# Patient Record
Sex: Male | Born: 1950 | Race: Black or African American | Hispanic: No | Marital: Single | Smoking: Former smoker
Health system: Southern US, Community
[De-identification: ages and names within clinical notes are randomized; demographics above are authoritative.]

## PROBLEM LIST (undated history)

## (undated) DIAGNOSIS — K5790 Diverticulosis of intestine, part unspecified, without perforation or abscess without bleeding: Secondary | ICD-10-CM

## (undated) DIAGNOSIS — Z973 Presence of spectacles and contact lenses: Secondary | ICD-10-CM

## (undated) DIAGNOSIS — J302 Other seasonal allergic rhinitis: Secondary | ICD-10-CM

## (undated) DIAGNOSIS — M1990 Unspecified osteoarthritis, unspecified site: Secondary | ICD-10-CM

## (undated) DIAGNOSIS — E669 Obesity, unspecified: Secondary | ICD-10-CM

## (undated) DIAGNOSIS — R0600 Dyspnea, unspecified: Secondary | ICD-10-CM

## (undated) DIAGNOSIS — K649 Unspecified hemorrhoids: Secondary | ICD-10-CM

## (undated) DIAGNOSIS — I10 Essential (primary) hypertension: Secondary | ICD-10-CM

## (undated) DIAGNOSIS — G709 Myoneural disorder, unspecified: Secondary | ICD-10-CM

## (undated) HISTORY — PX: KNEE ARTHROSCOPY: SUR90

## (undated) HISTORY — PX: TONSILLECTOMY: SUR1361

## (undated) HISTORY — DX: Diverticulosis of intestine, part unspecified, without perforation or abscess without bleeding: K57.90

## (undated) HISTORY — PX: OTHER SURGICAL HISTORY: SHX169

## (undated) HISTORY — DX: Unspecified osteoarthritis, unspecified site: M19.90

## (undated) HISTORY — DX: Unspecified hemorrhoids: K64.9

## (undated) HISTORY — DX: Obesity, unspecified: E66.9

---

## 1999-02-26 ENCOUNTER — Emergency Department (HOSPITAL_COMMUNITY): Admission: EM | Admit: 1999-02-26 | Discharge: 1999-02-26 | Payer: Self-pay | Admitting: Emergency Medicine

## 1999-02-26 ENCOUNTER — Encounter: Payer: Self-pay | Admitting: Emergency Medicine

## 1999-03-02 ENCOUNTER — Ambulatory Visit (HOSPITAL_COMMUNITY): Admission: RE | Admit: 1999-03-02 | Discharge: 1999-03-02 | Payer: Self-pay | Admitting: Orthopedic Surgery

## 1999-03-02 ENCOUNTER — Encounter: Payer: Self-pay | Admitting: Orthopedic Surgery

## 1999-03-10 ENCOUNTER — Inpatient Hospital Stay (HOSPITAL_COMMUNITY): Admission: RE | Admit: 1999-03-10 | Discharge: 1999-03-13 | Payer: Self-pay | Admitting: Orthopedic Surgery

## 1999-03-10 ENCOUNTER — Encounter: Payer: Self-pay | Admitting: Orthopedic Surgery

## 1999-11-28 ENCOUNTER — Emergency Department (HOSPITAL_COMMUNITY): Admission: EM | Admit: 1999-11-28 | Discharge: 1999-11-28 | Payer: Self-pay | Admitting: *Deleted

## 2005-05-24 ENCOUNTER — Ambulatory Visit: Payer: Self-pay | Admitting: Family Medicine

## 2005-06-25 HISTORY — PX: COLONOSCOPY: SHX174

## 2005-06-27 ENCOUNTER — Ambulatory Visit: Payer: Self-pay | Admitting: Family Medicine

## 2005-07-02 ENCOUNTER — Ambulatory Visit: Payer: Self-pay | Admitting: Internal Medicine

## 2005-07-13 ENCOUNTER — Ambulatory Visit: Payer: Self-pay | Admitting: Family Medicine

## 2005-09-03 ENCOUNTER — Ambulatory Visit: Payer: Self-pay | Admitting: Family Medicine

## 2005-10-29 ENCOUNTER — Ambulatory Visit: Payer: Self-pay | Admitting: Family Medicine

## 2006-01-15 ENCOUNTER — Ambulatory Visit: Payer: Self-pay | Admitting: Family Medicine

## 2006-01-24 ENCOUNTER — Ambulatory Visit: Payer: Self-pay | Admitting: Family Medicine

## 2006-01-29 ENCOUNTER — Encounter: Payer: Self-pay | Admitting: Cardiology

## 2006-01-29 ENCOUNTER — Ambulatory Visit: Payer: Self-pay

## 2006-02-14 ENCOUNTER — Ambulatory Visit (HOSPITAL_COMMUNITY): Admission: RE | Admit: 2006-02-14 | Discharge: 2006-02-14 | Payer: Self-pay | Admitting: Gastroenterology

## 2006-02-14 LAB — HM COLONOSCOPY: HM Colonoscopy: NORMAL

## 2006-02-26 ENCOUNTER — Ambulatory Visit: Payer: Self-pay | Admitting: Family Medicine

## 2006-03-04 ENCOUNTER — Ambulatory Visit: Payer: Self-pay | Admitting: *Deleted

## 2006-03-11 ENCOUNTER — Ambulatory Visit: Payer: Self-pay

## 2007-03-25 ENCOUNTER — Ambulatory Visit: Payer: Self-pay | Admitting: Family Medicine

## 2007-04-17 ENCOUNTER — Ambulatory Visit: Payer: Self-pay | Admitting: Family Medicine

## 2008-02-25 ENCOUNTER — Ambulatory Visit: Payer: Self-pay | Admitting: Family Medicine

## 2008-12-01 ENCOUNTER — Ambulatory Visit: Payer: Self-pay | Admitting: Family Medicine

## 2009-03-13 ENCOUNTER — Emergency Department (HOSPITAL_COMMUNITY): Admission: EM | Admit: 2009-03-13 | Discharge: 2009-03-13 | Payer: Self-pay | Admitting: Emergency Medicine

## 2009-03-13 IMAGING — CR DG SHOULDER 2+V*L*
3 series · 3 of 3 positions shown · non-contrast
Comparison: None

CLINICAL DATA: Left shoulder pain.

LEFT SHOULDER - 2+ VIEW

[w shoulder ap internal left]
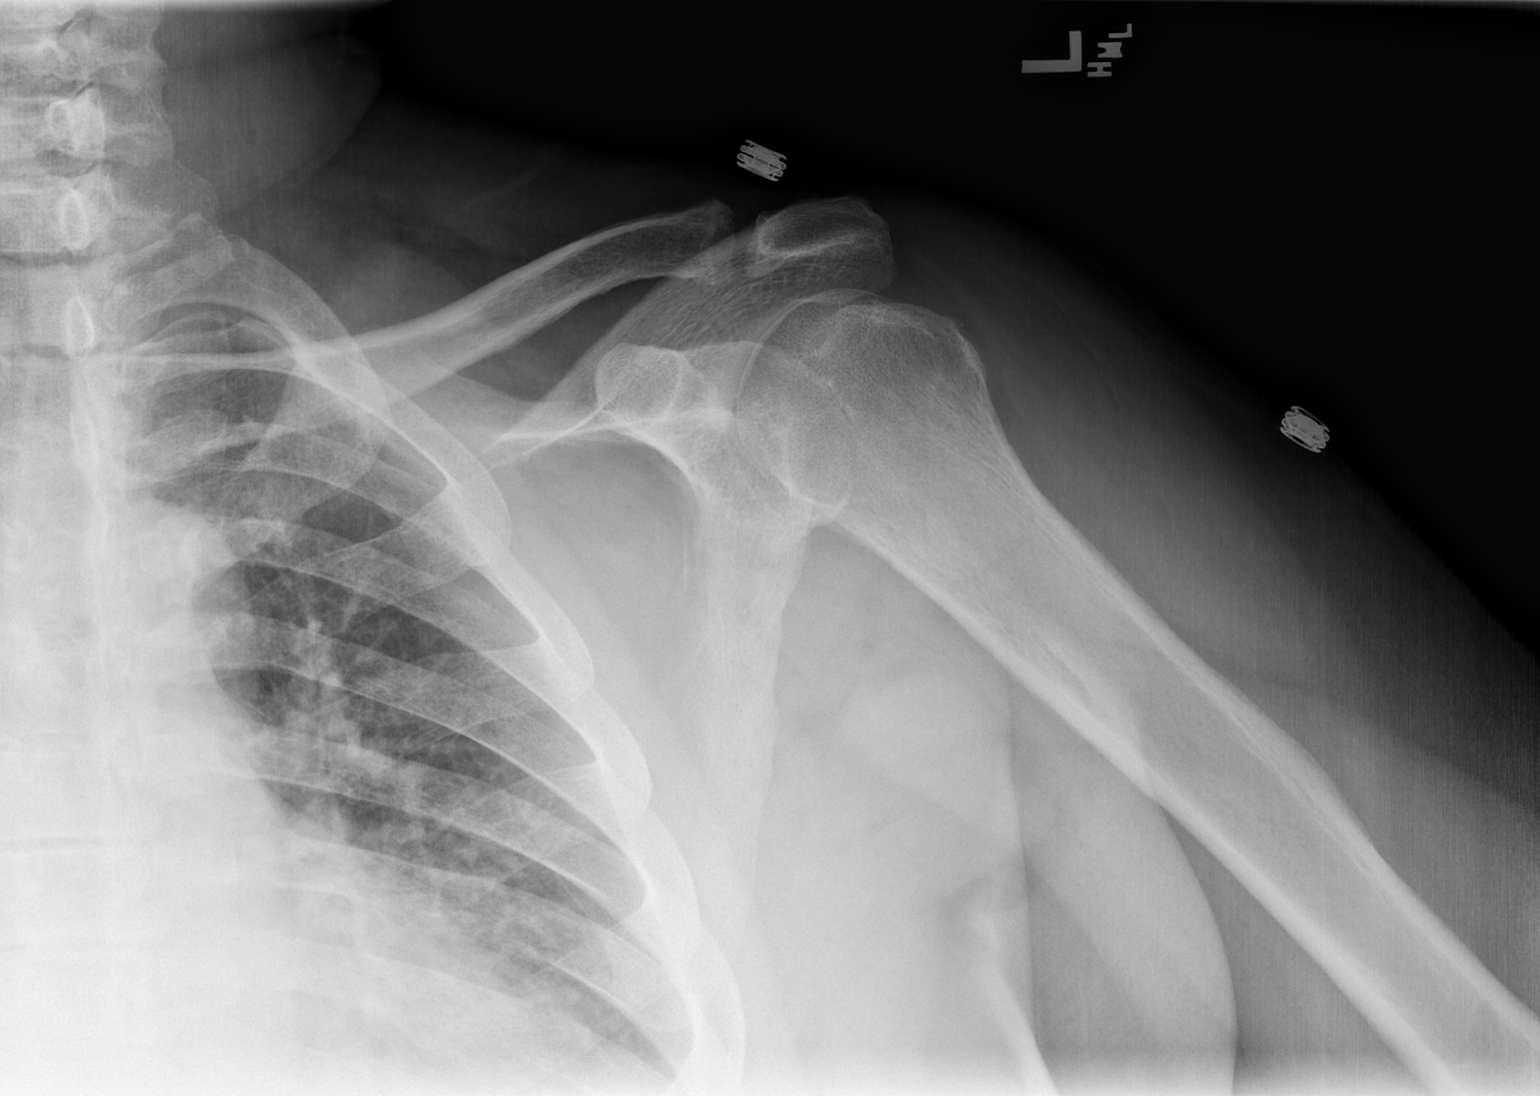

[w shoulder ap external left]
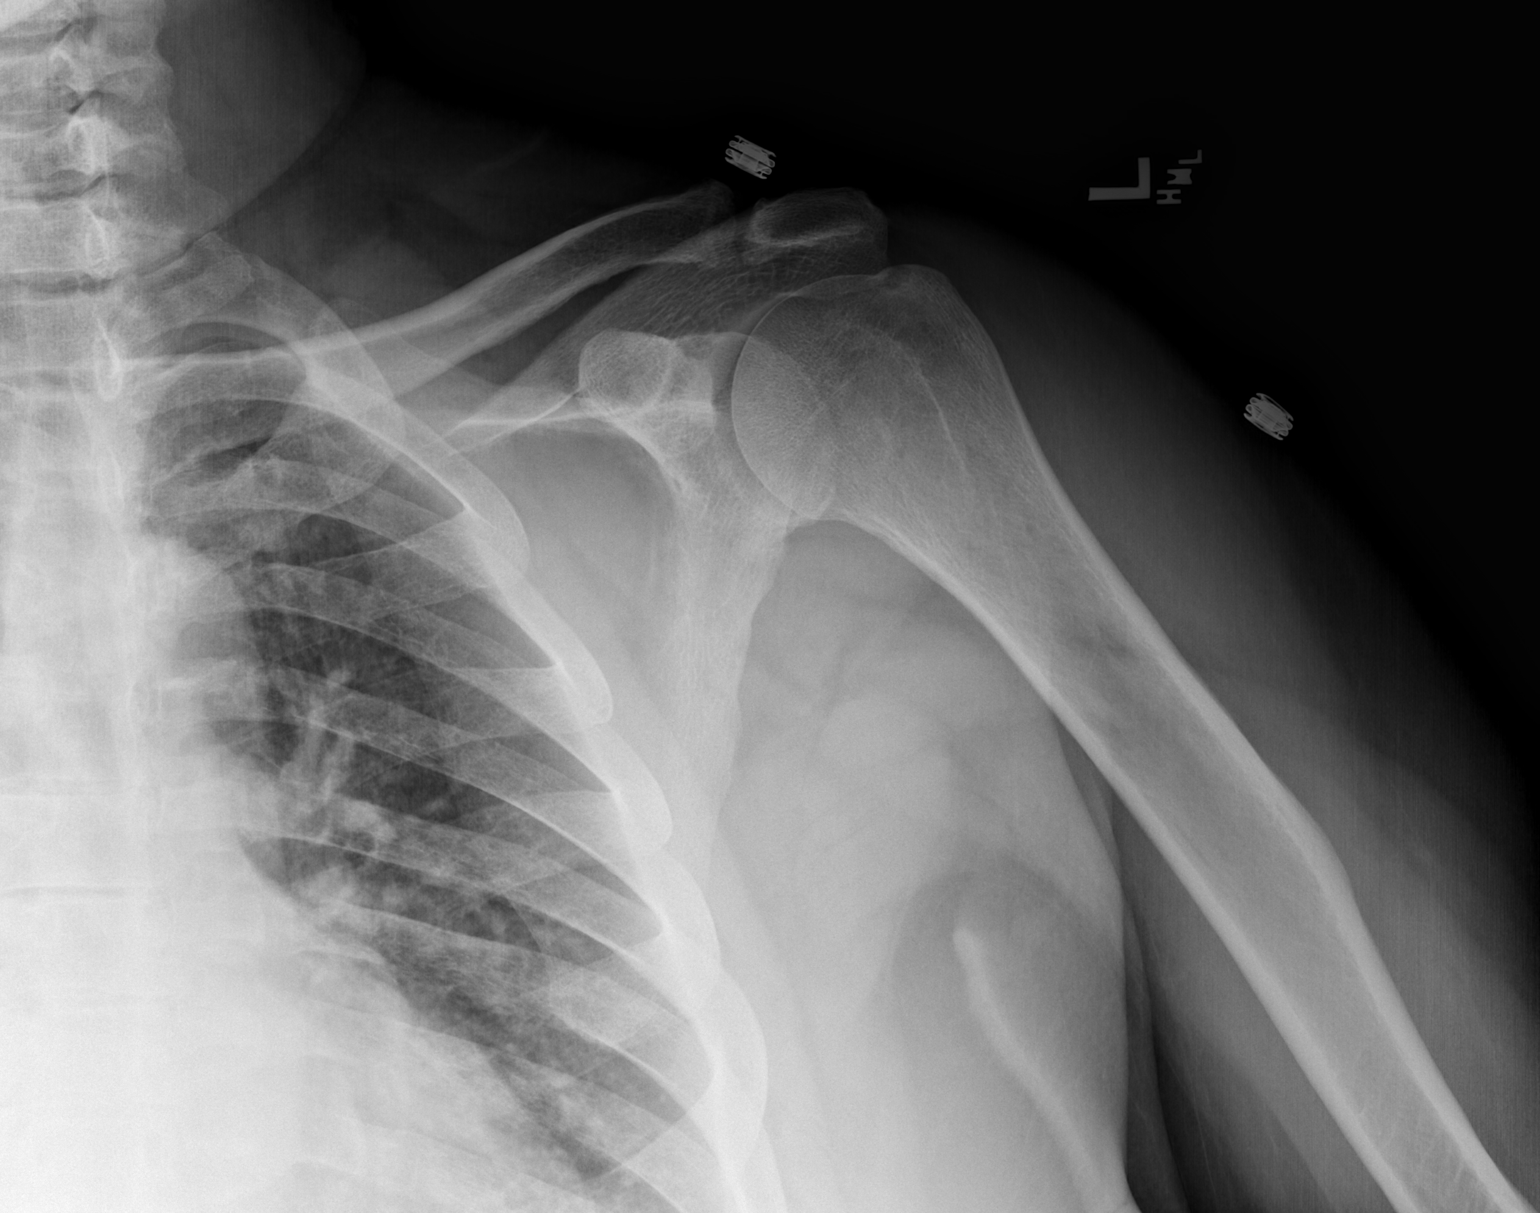

[w shoulder y view left]
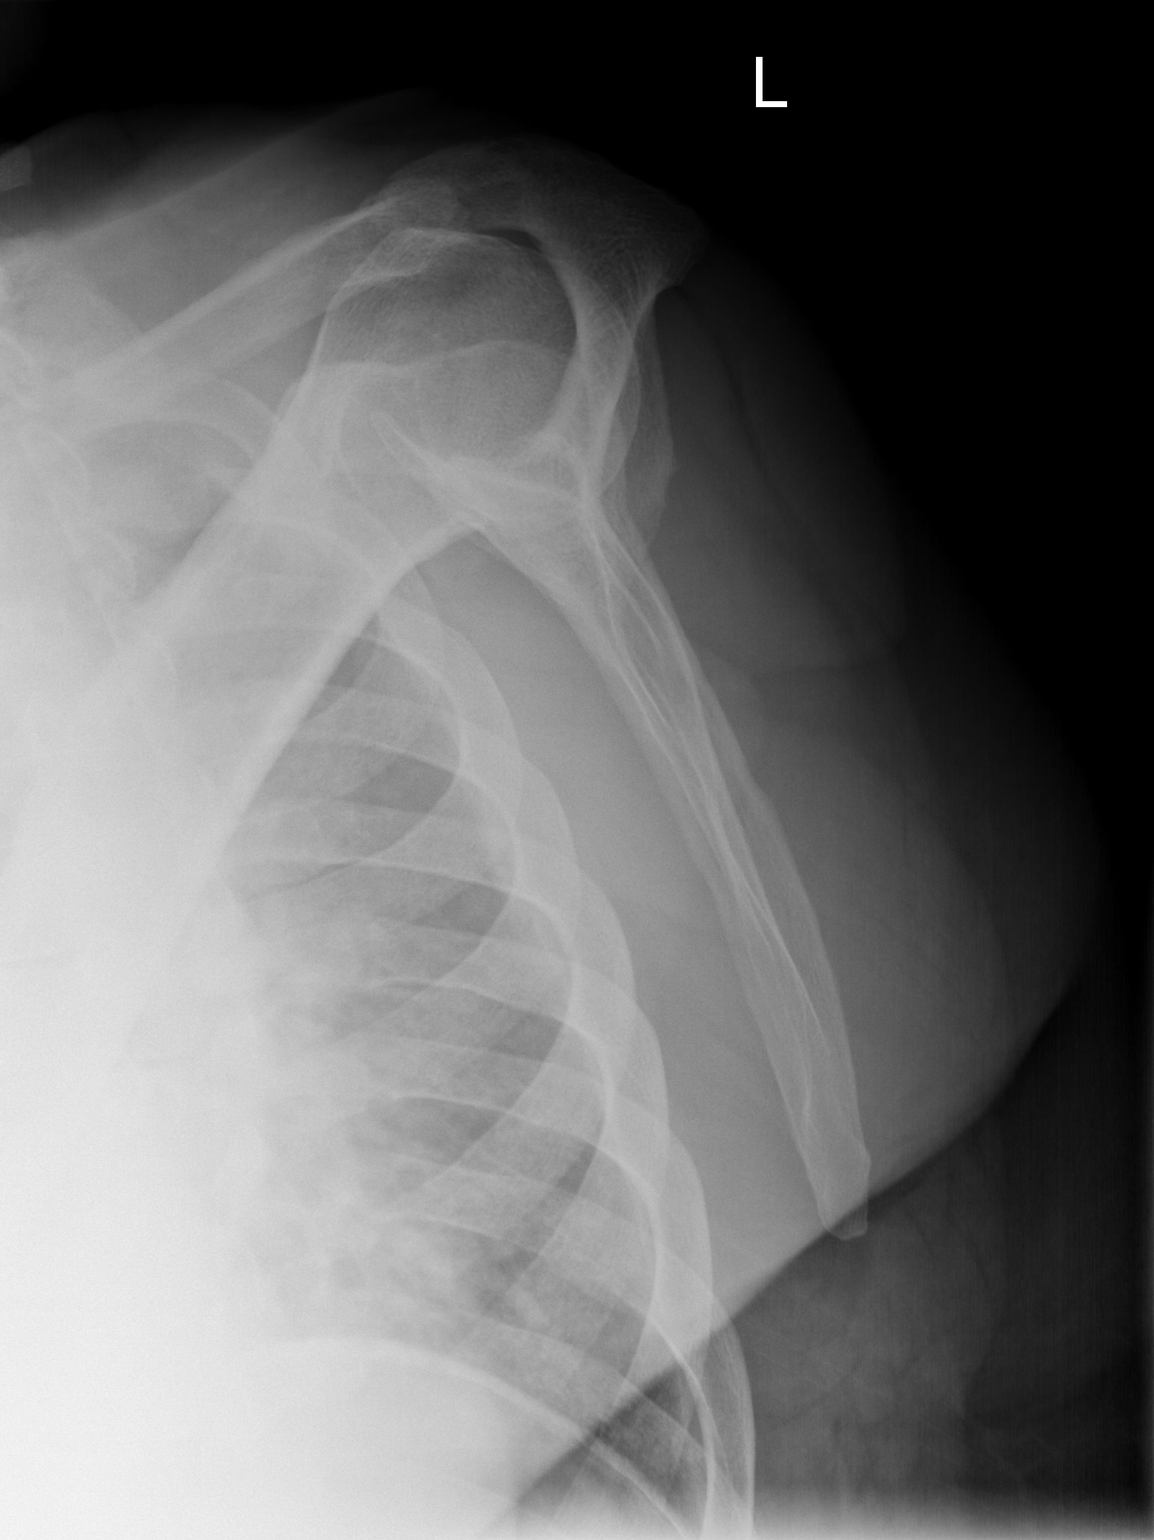

[3 of 3 positions shown; findings below may reference images not displayed]

FINDINGS: There is no evidence of fracture or dislocation.  The
left humeral head is seated within the glenoid fossa.  The
acromioclavicular joint is unremarkable in appearance.  No
significant soft tissue abnormalities are seen.  The visualized
portions of the left lung are clear.
IMPRESSION: No evidence of fracture or dislocation.

## 2009-03-14 ENCOUNTER — Ambulatory Visit: Payer: Self-pay | Admitting: Family Medicine

## 2009-09-10 ENCOUNTER — Emergency Department (HOSPITAL_COMMUNITY): Admission: EM | Admit: 2009-09-10 | Discharge: 2009-09-11 | Payer: Self-pay | Admitting: Emergency Medicine

## 2009-12-21 ENCOUNTER — Emergency Department (HOSPITAL_COMMUNITY): Admission: EM | Admit: 2009-12-21 | Discharge: 2009-12-21 | Payer: Self-pay | Admitting: Emergency Medicine

## 2009-12-29 ENCOUNTER — Ambulatory Visit: Payer: Self-pay | Admitting: Family Medicine

## 2010-06-05 ENCOUNTER — Ambulatory Visit: Payer: Self-pay | Admitting: Family Medicine

## 2010-11-10 NOTE — Letter (Signed)
March 04, 2006     Sharlot Gowda, M.D.  [address]  [place], [place] [address]   RE:  DRACO, MALCZEWSKI  MRN:  [id number]  /  DOB:  1950-08-10   Dear Jonny Ruiz:   Thank you for the opportunity to share in this nice patient's care.  Following his stress Myoview, we will be in contact with you.    Sincerely,      E. Graceann Congress, MD, [facility]   EJL/MedQ  DD:  03/04/2006  DT:  03/05/2006  Job #:  [id number]
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
## 2010-12-30 ENCOUNTER — Other Ambulatory Visit: Payer: Self-pay | Admitting: Family Medicine

## 2011-01-01 NOTE — Telephone Encounter (Signed)
Is this ok?

## 2011-06-22 ENCOUNTER — Other Ambulatory Visit: Payer: Self-pay | Admitting: Family Medicine

## 2011-07-23 ENCOUNTER — Other Ambulatory Visit: Payer: Self-pay | Admitting: Family Medicine

## 2011-07-23 NOTE — Telephone Encounter (Signed)
Is this ok?

## 2011-07-23 NOTE — Telephone Encounter (Signed)
Have him set up an appointment and make sure he has medications to cover until then

## 2011-08-07 ENCOUNTER — Encounter: Payer: Self-pay | Admitting: Family Medicine

## 2011-08-07 ENCOUNTER — Ambulatory Visit (INDEPENDENT_AMBULATORY_CARE_PROVIDER_SITE_OTHER): Payer: No Typology Code available for payment source | Admitting: Family Medicine

## 2011-08-07 VITALS — BP 144/100 | HR 95 | Ht 72.0 in | Wt 384.0 lb

## 2011-08-07 DIAGNOSIS — I10 Essential (primary) hypertension: Secondary | ICD-10-CM

## 2011-08-07 DIAGNOSIS — Z79899 Other long term (current) drug therapy: Secondary | ICD-10-CM

## 2011-08-07 DIAGNOSIS — M1990 Unspecified osteoarthritis, unspecified site: Secondary | ICD-10-CM | POA: Insufficient documentation

## 2011-08-07 DIAGNOSIS — M129 Arthropathy, unspecified: Secondary | ICD-10-CM

## 2011-08-07 LAB — LIPID PANEL
Cholesterol: 132 mg/dL (ref 0–200)
HDL: 51 mg/dL (ref >39)
LDL Cholesterol: 75 mg/dL (ref 0–99)
Total CHOL/HDL Ratio: 2.6 Ratio
Triglycerides: 28 mg/dL (ref <150)
VLDL: 6 mg/dL (ref 0–40)

## 2011-08-07 LAB — COMPREHENSIVE METABOLIC PANEL
ALT: 21 U/L (ref 0–53)
AST: 22 U/L (ref 0–37)
Albumin: 4.3 g/dL (ref 3.5–5.2)
Alkaline Phosphatase: 38 U/L — ABNORMAL LOW (ref 39–117)
BUN: 19 mg/dL (ref 6–23)
CO2: 29 mEq/L (ref 19–32)
Calcium: 9.7 mg/dL (ref 8.4–10.5)
Chloride: 103 mEq/L (ref 96–112)
Creat: 0.92 mg/dL (ref 0.50–1.35)
Glucose, Bld: 94 mg/dL (ref 70–99)
Potassium: 4.3 mEq/L (ref 3.5–5.3)
Sodium: 140 mEq/L (ref 135–145)
Total Bilirubin: 0.5 mg/dL (ref 0.3–1.2)
Total Protein: 7.3 g/dL (ref 6.0–8.3)

## 2011-08-07 LAB — CBC WITH DIFFERENTIAL/PLATELET
Basophils Absolute: 0 10*3/uL (ref 0.0–0.1)
Basophils Relative: 0 % (ref 0–1)
Eosinophils Absolute: 0.2 10*3/uL (ref 0.0–0.7)
Eosinophils Relative: 1 % (ref 0–5)
HCT: 44.2 % (ref 39.0–52.0)
Hemoglobin: 14.6 g/dL (ref 13.0–17.0)
Lymphocytes Relative: 24 % (ref 12–46)
Lymphs Abs: 2.8 10*3/uL (ref 0.7–4.0)
MCH: 28.2 pg (ref 26.0–34.0)
MCHC: 33 g/dL (ref 30.0–36.0)
MCV: 85.3 fL (ref 78.0–100.0)
Monocytes Absolute: 0.9 10*3/uL (ref 0.1–1.0)
Monocytes Relative: 8 % (ref 3–12)
Neutro Abs: 8 10*3/uL — ABNORMAL HIGH (ref 1.7–7.7)
Neutrophils Relative %: 67 % (ref 43–77)
Platelets: 262 10*3/uL (ref 150–400)
RBC: 5.18 MIL/uL (ref 4.22–5.81)
RDW: 13.4 % (ref 11.5–15.5)
WBC: 11.9 10*3/uL — ABNORMAL HIGH (ref 4.0–10.5)

## 2011-08-07 MED ORDER — TRAMADOL HCL 50 MG PO TABS: 50.0000 mg | ORAL_TABLET | Freq: Two times a day (BID) | ORAL | Status: DC

## 2011-08-07 MED ORDER — AMLODIPINE BESYLATE 5 MG PO TABS: 5.0000 mg | ORAL_TABLET | Freq: Every day | ORAL | Status: DC

## 2011-08-07 MED ORDER — SULINDAC 150 MG PO TABS: 150.0000 mg | ORAL_TABLET | Freq: Two times a day (BID) | ORAL | Status: DC

## 2011-08-07 MED ORDER — LISINOPRIL-HYDROCHLOROTHIAZIDE 20-25 MG PO TABS: 1.0000 | ORAL_TABLET | Freq: Every day | ORAL | Status: DC

## 2011-08-07 NOTE — Progress Notes (Signed)
  Subjective:    Patient ID: Aaron Castro, male    DOB: [birth year], 61 y.o.   MRN: [id number]  HPI He is here for a interval evaluation. He continues to complain of difficulty with knee and back pain and states this keeps him from exercising regularly. He would like a refill on his medications.   Review of Systems     Objective:   Physical Exam alert and in no distress. Tympanic membranes and canals are normal. Throat is clear. Tonsils are normal. Neck is supple without adenopathy or thyromegaly. Cardiac exam shows a regular sinus rhythm without murmurs or gallops. Lungs are clear to auscultation.        Assessment & Plan:   1. Hypertension  amLODipine (NORVASC) 5 MG tablet, lisinopril-hydrochlorothiazide (PRINZIDE,ZESTORETIC) 20-25 MG per tablet, CBC with Differential, Comprehensive metabolic panel, Lipid panel  2. Obesity, Class III, BMI 40-49.9 (morbid obesity)  CBC with Differential, Comprehensive metabolic panel, Lipid panel  3. Arthritis  sulindac (CLINORIL) 150 MG tablet, traMADol (ULTRAM) 50 MG tablet  4. Encounter for long-term (current) use of other medications  CBC with Differential, Comprehensive metabolic panel, Lipid panel   encouraged him to slowly increase his physical activities as well as make further changes in his diet. Recommend he walk one or 2 blocks at a time and then slowly increase this on a weekly basis.

## 2011-08-07 NOTE — Patient Instructions (Signed)
Get out and walk one or 2 blocks every day and then slowly increase your speed and then your distance.

## 2011-08-08 NOTE — Progress Notes (Signed)
Quick Note:  The blood work is normal ______ 

## 2011-09-28 ENCOUNTER — Emergency Department (HOSPITAL_COMMUNITY)
Admission: EM | Admit: 2011-09-28 | Discharge: 2011-09-28 | Disposition: A | Discharge status: Home / Self Care | Payer: No Typology Code available for payment source | Attending: Emergency Medicine | Admitting: Emergency Medicine

## 2011-09-28 ENCOUNTER — Encounter (HOSPITAL_COMMUNITY): Payer: Self-pay | Admitting: *Deleted

## 2011-09-28 DIAGNOSIS — I10 Essential (primary) hypertension: Secondary | ICD-10-CM | POA: Insufficient documentation

## 2011-09-28 DIAGNOSIS — M545 Low back pain, unspecified: Secondary | ICD-10-CM | POA: Insufficient documentation

## 2011-09-28 DIAGNOSIS — M549 Dorsalgia, unspecified: Secondary | ICD-10-CM

## 2011-09-28 DIAGNOSIS — Z87891 Personal history of nicotine dependence: Secondary | ICD-10-CM | POA: Insufficient documentation

## 2011-09-28 HISTORY — DX: Essential (primary) hypertension: I10

## 2011-09-28 MED ORDER — METHOCARBAMOL 500 MG PO TABS: 500.0000 mg | ORAL_TABLET | Freq: Two times a day (BID) | ORAL | Status: AC

## 2011-09-28 NOTE — Discharge Instructions (Signed)
Followup with orthopedics if symptoms continue. Use conservative methods at home including heat therapy and cold therapy as we discussed. More information on cold therapy is listed below.  It is not recommended to use heat treatment directly after an acute injury.  SEEK IMMEDIATE MEDICAL ATTENTION IF: New numbness, tingling, weakness, or problem with the use of your arms or legs.  Severe back pain not relieved with medications.  Change in bowel or bladder control.  Increasing pain in any areas of the body (such as chest or abdominal pain).  Shortness of breath, dizziness or fainting.  Nausea (feeling sick to your stomach), vomiting, fever, or sweats.  COLD THERAPY DIRECTIONS:  Ice or gel packs can be used to reduce both pain and swelling. Ice is the most helpful within the first 24 to 48 hours after an injury or flareup from overusing a muscle or joint.  Ice is effective, has very few side effects, and is safe for most people to use.   If you expose your skin to cold temperatures for too long or without the proper protection, you can damage your skin or nerves. Watch for signs of skin damage due to cold.   HOME CARE INSTRUCTIONS  Follow these tips to use ice and cold packs safely.  Place a dry or damp towel between the ice and skin. A damp towel will cool the skin more quickly, so you may need to shorten the time that the ice is used.  For a more rapid response, add gentle compression to the ice.  Ice for no more than 10 to 20 minutes at a time. The bonier the area you are icing, the less time it will take to get the benefits of ice.  Check your skin after 5 minutes to make sure there are no signs of a poor response to cold or skin damage.  Rest 20 minutes or more in between uses.  Once your skin is numb, you can end your treatment. You can test numbness by very lightly touching your skin. The touch should be so light that you do not see the skin dimple from the pressure of your fingertip. When  using ice, most people will feel these normal sensations in this order: cold, burning, aching, and numbness.  Do not use ice on someone who cannot communicate their responses to pain, such as small children or people with dementia.   HOW TO MAKE AN ICE PACK  To make an ice pack, do one of the following:  Place crushed ice or a bag of frozen vegetables in a sealable plastic bag. Squeeze out the excess air. Place this bag inside another plastic bag. Slide the bag into a pillowcase or place a damp towel between your skin and the bag.  Mix 3 parts water with 1 part rubbing alcohol. Freeze the mixture in a sealable plastic bag. When you remove the mixture from the freezer, it will be slushy. Squeeze out the excess air. Place this bag inside another plastic bag. Slide the bag into a pillowcase or place a damp towel between your skin and the bag.   SEEK MEDICAL CARE IF:  You develop white spots on your skin. This may give the skin a blotchy (mottled) appearance.  Your skin turns blue or pale.  Your skin becomes waxy or hard.  Your swelling gets worse.  MAKE SURE YOU:  Understand these instructions.  Will watch your condition.  Will get help right away if you are not doing well or  get worse.    Chronic Pain Discharge Instructions  Emergency care providers appreciate that many patients coming to [place] are in severe pain and we wish to address their pain in the safest, most responsible manner.  It is important to recognize however, that the proper treatment of chronic pain differs from that of the pain of injuries and acute illnesses.  Our goal is to provide quality, safe, personalized care and we thank you for giving [place] the opportunity to serve you. The use of narcotics and related agents for chronic pain syndromes may lead to additional physical and psychological problems.  Nearly as many people die from prescription narcotics each year as die from car crashes.  Additionally, this risk is increased if such  prescriptions are obtained from a variety of sources.  Therefore, only your primary care physician or a pain management specialist is able to safely treat such syndromes with narcotic medications long-term.    Documentation revealing such prescriptions have been sought from multiple sources may prohibit [place] from providing a refill or different narcotic medication.  Your name may be checked first through the [facility] Controlled Substances Reporting System.  This database is a record of controlled substance medication prescriptions that the patient has received.  This has been established by [facility] in an effort to eliminate the dangerous, and often life threatening, practice of obtaining multiple prescriptions from different medical providers.   If you have a chronic pain syndrome (i.e. chronic headaches, recurrent back or neck pain, dental pain, abdominal or pelvis pain without a specific diagnosis, or neuropathic pain such as fibromyalgia) or recurrent visits for the same condition without an acute diagnosis, you may be treated with non-narcotics and other non-addictive medicines.  Allergic reactions or negative side effects that may be reported by a patient to such medications will not typically lead to the use of a narcotic analgesic or other controlled substance as an alternative.   Patients managing chronic pain with a personal physician should have provisions in place for breakthrough pain.  If you are in crisis, you should call your physician.  If your physician directs you to the emergency department, please have the doctor call and speak to our attending physician concerning your care.   When patients come to the Emergency Department (ED) with acute medical conditions in which the Emergency Department physician feels appropriate to prescribe narcotic or sedating pain medication, the physician will prescribe these in very limited quantities.  The amount of these medications will last only  until you can see your primary care physician in his/her office.  Any patient who returns to the ED seeking refills should expect only non-narcotic pain medications.   In the event of an acute medical condition exists and the emergency physician feels it is necessary that the patient be given a narcotic or sedating medication -  a responsible adult driver should be present in the room prior to the medication being given by the nurse.   Prescriptions for narcotic or sedating medications that have been lost, stolen or expired will not be refilled in the Emergency Department.    Patients who have chronic pain may receive non-narcotic prescriptions until seen by their primary care physician.  It is every patient's personal responsibility to maintain active prescriptions with his or her primary care physician or specialist.   Back Exercises Back exercises help treat and prevent back injuries. The goal of back exercises is to increase the strength of your abdominal and back muscles and  the flexibility of your back. These exercises should be started when you no longer have back pain. Back exercises include:  Pelvic Tilt. Lie on your back with your knees bent. Tilt your pelvis until the lower part of your back is against the floor. Hold this position 5 to 10 sec and repeat 5 to 10 times.   Knee to Chest. Pull first 1 knee up against your chest and hold for 20 to 30 seconds, repeat this with the other knee, and then both knees. This may be done with the other leg straight or bent, whichever feels better.   Sit-Ups or Curl-Ups. Bend your knees 90 degrees. Start with tilting your pelvis, and do a partial, slow sit-up, lifting your trunk only 30 to 45 degrees off the floor. Take at least 2 to 3 seconds for each sit-up. Do not do sit-ups with your knees out straight. If partial sit-ups are difficult, simply do the above but with only tightening your abdominal muscles and holding it as directed.   Hip-Lift. Lie  on your back with your knees flexed 90 degrees. Push down with your feet and shoulders as you raise your hips a couple inches off the floor; hold for 10 seconds, repeat 5 to 10 times.   Back arches. Lie on your stomach, propping yourself up on bent elbows. Slowly press on your hands, causing an arch in your low back. Repeat 3 to 5 times. Any initial stiffness and discomfort should lessen with repetition over time.   Shoulder-Lifts. Lie face down with arms beside your body. Keep hips and torso pressed to floor as you slowly lift your head and shoulders off the floor.  Do not overdo your exercises, especially in the beginning. Exercises may cause you some mild back discomfort which lasts for a few minutes; however, if the pain is more severe, or lasts for more than 15 minutes, do not continue exercises until you see your caregiver. Improvement with exercise therapy for back problems is slow.  See your caregivers for assistance with developing a proper back exercise program. Document Released: 07/19/2004 Document Revised: 05/31/2011 Document Reviewed: 06/11/2005 [facility] Patient Information 2012 [place], [place].

## 2011-09-28 NOTE — ED Notes (Signed)
To ed for eval of lower back pain over the past cple days. Also c/o left shoulder pain. No injury noted. Denies difficulty with urination.

## 2011-09-28 NOTE — ED Provider Notes (Signed)
History     CSN: [id number]  Arrival date & time 09/28/11  1506   First MD Initiated Contact with Patient 09/28/11 1557      Chief Complaint  Patient presents with  . Back Pain    (Consider location/radiation/quality/duration/timing/severity/associated sxs/prior treatment) HPI Comments: Patient with back pain for the past two days.  No trauma or acute injury.  Pain worse with lateral twisting of the back.  Pain located on the right side of lower back.  Patient describes the pain as a "spasm"  Patient is a 61 y.o. male presenting with back pain. The history is provided by the patient.  Back Pain  This is a new problem. The current episode started 2 days ago. The problem has not changed since onset.The pain is associated with no known injury. The pain is present in the lumbar spine. Pertinent negatives include no fever, no numbness, no abdominal pain, no bowel incontinence, no perianal numbness, no bladder incontinence, no dysuria, no paresthesias, no paresis, no tingling and no weakness. He has tried nothing for the symptoms.    Past Medical History  Diagnosis Date  . Hypertension     History reviewed. No pertinent past surgical history.  History reviewed. No pertinent family history.  History  Substance Use Topics  . Smoking status: Former Smoker    Quit date: 06/25/1992  . Smokeless tobacco: Never Used  . Alcohol Use: 1.8 oz/week    3 Cans of beer per week      Review of Systems  Constitutional: Negative for fever and chills.  HENT: Negative for neck pain and neck stiffness.   Respiratory: Negative for shortness of breath.   Gastrointestinal: Negative for vomiting, abdominal pain and bowel incontinence.  Genitourinary: Negative for bladder incontinence, dysuria, frequency, hematuria, decreased urine volume and difficulty urinating.  Musculoskeletal: Positive for back pain. Negative for gait problem.  Skin: Negative for color change.  Neurological: Negative for  tingling, tremors, weakness, numbness and paresthesias.    Allergies  Review of patient's allergies indicates no known allergies.  Home Medications   Current Outpatient Rx  Name Route Sig Dispense Refill  . AMLODIPINE BESYLATE 5 MG PO TABS Oral Take 5 mg by mouth daily.    Marland Kitchen LISINOPRIL-HYDROCHLOROTHIAZIDE 20-25 MG PO TABS Oral Take 1 tablet by mouth daily.    . SULINDAC 150 MG PO TABS Oral Take 150 mg by mouth 2 (two) times daily.    . TRAMADOL HCL 50 MG PO TABS Oral Take 50 mg by mouth 2 (two) times daily.      BP 131/58  Pulse 97  Temp(Src) 98.9 F (37.2 C) (Oral)  Resp 18  SpO2 95%  Physical Exam  Nursing note and vitals reviewed. Constitutional: He is oriented to person, place, and time. He appears well-developed and well-nourished. No distress.  HENT:  Head: Normocephalic and atraumatic.  Eyes: Conjunctivae and EOM are normal. Pupils are equal, round, and reactive to light. No scleral icterus.  Neck: Normal range of motion and full passive range of motion without pain. Neck supple. No spinous process tenderness and no muscular tenderness present. No rigidity. Normal range of motion present. No Brudzinski's sign noted.  Cardiovascular: Normal rate, regular rhythm and intact distal pulses.  Exam reveals no gallop and no friction rub.   No murmur heard. Pulmonary/Chest: Effort normal and breath sounds normal. No respiratory distress. He has no wheezes. He has no rales. He exhibits no tenderness.  Musculoskeletal:       Cervical back:  He exhibits normal range of motion, no tenderness, no bony tenderness and no pain.       Thoracic back: He exhibits no tenderness, no bony tenderness and no pain.       Lumbar back: He exhibits no tenderness, no bony tenderness, no pain, no spasm and normal pulse.       Bilateral lower extremities nontender without color change, baseline range of motion. Pt has increased pain w ROM of lumbar spine. Pain w ambulation, no sign of ataxia.    Neurological: He is alert and oriented to person, place, and time. He has normal strength and normal reflexes. No sensory deficit. Gait (no ataxia, slowed and hunched d/t pain ) abnormal.       sensation at baseline for light touch in all 4 distal extremities, motor symmetric & bilateral 5/5  Abduction,adduction,flexion of hips, knee flexion & extension, foot dorsiflexion, foot plantar flexion.  Skin: Skin is warm and dry. No rash noted. He is not diaphoretic. No erythema. No pallor.  Psychiatric: He has a normal mood and affect.    ED Course  Procedures (including critical care time)  Labs Reviewed - No data to display No results found.   No diagnosis found.    MDM  Patient with back pain.  No neurological deficits and normal neuro exam.  Patient can walk but states is painful.  No loss of bowel or bladder control.  No concern for cauda equina.  No fever, night sweats, weight loss, h/o cancer, IVDU.  RICE protocol and pain medicine indicated and discussed with patient.   Patient given Rx for muscle relaxer.          Pascal Lux [place], PA-C 09/29/11 0139

## 2011-10-01 NOTE — ED Provider Notes (Signed)
History/physical exam/procedure(s) were performed by non-physician practitioner and as supervising physician I was immediately available for consultation/collaboration. I have reviewed all notes and am in agreement with care and plan.   Hilario Quarry, MD 10/01/11 1321

## 2012-03-03 ENCOUNTER — Telehealth: Payer: Self-pay | Admitting: Internal Medicine

## 2012-03-03 MED ORDER — SULINDAC 150 MG PO TABS: 150.0000 mg | ORAL_TABLET | Freq: Two times a day (BID) | ORAL | Status: DC

## 2012-03-03 NOTE — Telephone Encounter (Signed)
Sulindac renewed

## 2012-03-04 ENCOUNTER — Encounter: Payer: Self-pay | Admitting: Internal Medicine

## 2012-03-21 ENCOUNTER — Other Ambulatory Visit: Payer: Self-pay | Admitting: Medical

## 2012-03-21 ENCOUNTER — Telehealth: Payer: Self-pay | Admitting: Family Medicine

## 2012-03-21 MED ORDER — TRAMADOL HCL 50 MG PO TABS: 50.0000 mg | ORAL_TABLET | Freq: Two times a day (BID) | ORAL | Status: DC

## 2012-03-25 NOTE — Telephone Encounter (Signed)
done

## 2012-04-08 ENCOUNTER — Telehealth: Payer: Self-pay | Admitting: Internal Medicine

## 2012-04-08 MED ORDER — TRAMADOL HCL 50 MG PO TABS: 50.0000 mg | ORAL_TABLET | Freq: Two times a day (BID) | ORAL | Status: DC

## 2012-04-08 NOTE — Telephone Encounter (Signed)
PT HAS APPT Apr 28 2012 REFILLED MED FOR 30 DAYS

## 2012-04-08 NOTE — Telephone Encounter (Signed)
Refill request for tramadol 50mg . Directions say take 1 tablet by mouth twice daily. Send to [facility]

## 2012-04-08 NOTE — Telephone Encounter (Signed)
Have him come in for a followup appointment. Renew the tramadol until he can be seen

## 2012-04-18 ENCOUNTER — Encounter: Payer: Self-pay | Admitting: Internal Medicine

## 2012-04-28 ENCOUNTER — Ambulatory Visit (INDEPENDENT_AMBULATORY_CARE_PROVIDER_SITE_OTHER): Payer: No Typology Code available for payment source | Admitting: Family Medicine

## 2012-04-28 VITALS — Wt 396.0 lb

## 2012-04-28 DIAGNOSIS — M129 Arthropathy, unspecified: Secondary | ICD-10-CM

## 2012-04-28 DIAGNOSIS — G8929 Other chronic pain: Secondary | ICD-10-CM

## 2012-04-28 DIAGNOSIS — I10 Essential (primary) hypertension: Secondary | ICD-10-CM

## 2012-04-28 DIAGNOSIS — M1990 Unspecified osteoarthritis, unspecified site: Secondary | ICD-10-CM

## 2012-04-28 MED ORDER — SULINDAC 150 MG PO TABS: 150.0000 mg | ORAL_TABLET | Freq: Two times a day (BID) | ORAL | Status: DC

## 2012-04-28 MED ORDER — LISINOPRIL-HYDROCHLOROTHIAZIDE 20-25 MG PO TABS: 1.0000 | ORAL_TABLET | Freq: Every day | ORAL | Status: DC

## 2012-04-28 MED ORDER — AMLODIPINE BESYLATE 5 MG PO TABS: 5.0000 mg | ORAL_TABLET | Freq: Every day | ORAL | Status: DC

## 2012-04-28 MED ORDER — TRAMADOL HCL 50 MG PO TABS: 50.0000 mg | ORAL_TABLET | Freq: Two times a day (BID) | ORAL | Status: DC
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
## 2012-08-26 ENCOUNTER — Ambulatory Visit: Payer: No Typology Code available for payment source | Admitting: Family Medicine

## 2012-09-30 ENCOUNTER — Ambulatory Visit: Payer: No Typology Code available for payment source | Admitting: Family Medicine

## 2012-10-27 ENCOUNTER — Other Ambulatory Visit: Payer: Self-pay | Admitting: Family Medicine

## 2012-10-27 NOTE — Telephone Encounter (Signed)
Is this okay?

## 2013-02-04 ENCOUNTER — Encounter (HOSPITAL_COMMUNITY): Payer: Self-pay | Admitting: *Deleted

## 2013-02-04 ENCOUNTER — Emergency Department (HOSPITAL_COMMUNITY)
Admission: EM | Admit: 2013-02-04 | Discharge: 2013-02-04 | Disposition: A | Discharge status: Home / Self Care | Payer: No Typology Code available for payment source | Attending: Emergency Medicine | Admitting: Emergency Medicine

## 2013-02-04 DIAGNOSIS — T148XXA Other injury of unspecified body region, initial encounter: Secondary | ICD-10-CM

## 2013-02-04 DIAGNOSIS — Z79899 Other long term (current) drug therapy: Secondary | ICD-10-CM | POA: Insufficient documentation

## 2013-02-04 DIAGNOSIS — M545 Low back pain, unspecified: Secondary | ICD-10-CM | POA: Insufficient documentation

## 2013-02-04 DIAGNOSIS — Z8719 Personal history of other diseases of the digestive system: Secondary | ICD-10-CM | POA: Insufficient documentation

## 2013-02-04 DIAGNOSIS — I10 Essential (primary) hypertension: Secondary | ICD-10-CM | POA: Insufficient documentation

## 2013-02-04 DIAGNOSIS — X58XXXA Exposure to other specified factors, initial encounter: Secondary | ICD-10-CM | POA: Insufficient documentation

## 2013-02-04 DIAGNOSIS — IMO0002 Reserved for concepts with insufficient information to code with codable children: Secondary | ICD-10-CM | POA: Insufficient documentation

## 2013-02-04 DIAGNOSIS — Z87891 Personal history of nicotine dependence: Secondary | ICD-10-CM | POA: Insufficient documentation

## 2013-02-04 DIAGNOSIS — Z8739 Personal history of other diseases of the musculoskeletal system and connective tissue: Secondary | ICD-10-CM | POA: Insufficient documentation

## 2013-02-04 DIAGNOSIS — Y929 Unspecified place or not applicable: Secondary | ICD-10-CM | POA: Insufficient documentation

## 2013-02-04 DIAGNOSIS — Y939 Activity, unspecified: Secondary | ICD-10-CM | POA: Insufficient documentation

## 2013-02-04 MED ORDER — CYCLOBENZAPRINE HCL 10 MG PO TABS: 10.0000 mg | ORAL_TABLET | Freq: Three times a day (TID) | ORAL | Status: DC | PRN

## 2013-02-04 NOTE — ED Notes (Signed)
The pt has had bi-lateral thigh pain for 3-4 days.  No known injury but the pain appears to be getting worse

## 2013-02-04 NOTE — ED Provider Notes (Signed)
CSN: [id number]     Arrival date & time 02/04/13  2133 History     None    Chief Complaint  Patient presents with  . painful legs    HPI  Hx provided by pt.  Pt is a 62 yo male with PMH of HTN, morbid obesity who presents with complaints of "muscle soreness" to the back of both upper legs.  Pain has been present for the past 4 days.  Pt denies any trauma or injury.  Does not recall any strenuous activity or movements.  Pain is only present with certain movements especially when moving from sitting to standing.  He denies similar symptoms previously.  Pt has used some of his old Tramadol pain medications which do seem to help some.  Symptoms have been associated with some low back ache as well.  He denies any weight changes.  No fever, chills or sweats.  No abdominal pains.  No skin changes or rash.  No other aggravating or alleviating factors.    Past Medical History  Diagnosis Date  . Hypertension   . Obesity   . Arthritis   . Hemorrhoids   . Diverticulosis    Past Surgical History  Procedure Laterality Date  . Colonoscopy  2007    Dr.Hung   No family history on file. History  Substance Use Topics  . Smoking status: Former Smoker    Quit date: 06/25/1992  . Smokeless tobacco: Never Used  . Alcohol Use: 1.8 oz/week    3 Cans of beer per week    Review of Systems  Respiratory: Negative for shortness of breath.   Cardiovascular: Negative for chest pain.  Musculoskeletal: Positive for back pain.  Neurological: Negative for weakness and numbness.  All other systems reviewed and are negative.    Allergies  Review of patient's allergies indicates no known allergies.  Home Medications   Current Outpatient Rx  Name  Route  Sig  Dispense  Refill  . amLODipine (NORVASC) 5 MG tablet   Oral   Take 5 mg by mouth daily.         Marland Kitchen lisinopril-hydrochlorothiazide (PRINZIDE,ZESTORETIC) 20-25 MG per tablet   Oral   Take 1 tablet by mouth daily.         . Menthol-Methyl  Salicylate (MUSCLE RUB) 10-15 % CREA   Topical   Apply 1 application topically daily as needed (for muscle soreness in legs).         . sulindac (CLINORIL) 150 MG tablet   Oral   Take 150 mg by mouth 2 (two) times daily.         . traMADol (ULTRAM) 50 MG tablet   Oral   Take 50 mg by mouth 2 (two) times daily.         . cyclobenzaprine (FLEXERIL) 10 MG tablet   Oral   Take 1 tablet (10 mg total) by mouth 3 (three) times daily as needed for muscle spasms.   30 tablet   0    BP 169/76  Pulse 99  Temp(Src) 98.4 F (36.9 C) (Oral)  Resp 18  SpO2 94% Physical Exam  Nursing note and vitals reviewed. Constitutional: He is oriented to person, place, and time. He appears well-developed and well-nourished. No distress.  Morbidly obese  HENT:  Head: Normocephalic.  Cardiovascular: Normal rate and regular rhythm.   Pulmonary/Chest: Effort normal and breath sounds normal. No respiratory distress. He has no wheezes.  Abdominal: Soft.  Musculoskeletal: Normal range of motion. He  exhibits tenderness. He exhibits no edema.  Normal ROM of lower extremities.  Pt has some TTP over bilateral hamstring area.  Tendons in popliteal fossa normal without significant tenderness.  No masses.  Skin non-tender.  There is thickened and darkened skin within the medial and upper thighs and going.  No scaling.  No erythema.  Normal distal pulses and sensations. Normal strength in lower extremities.   Mild TTP over lower lumbar area and sacrum.  Pain with moving from sitting to standing.   Neurological: He is alert and oriented to person, place, and time.  Skin: Skin is warm.  Psychiatric: He has a normal mood and affect. His behavior is normal.    ED Course   Procedures   1. Muscle strain     MDM  Pt seen and evaluated.  Pt appears in mild discomfort but no acute distress.  No concerning or red flag symptoms.  No indications for imaging at this time.  Symptoms may be muscle tightness however,  given body habitus and mild tenderness in low back area he may also have some radicular pains.  Pt only wishes to try a muscle relaxor at this time.  He has good follow up and will call his PCP.     Angus Seller, PA-C 02/06/13 [phone]

## 2013-02-07 NOTE — ED Provider Notes (Signed)
Medical screening examination/treatment/procedure(s) were performed by non-physician practitioner and as supervising physician I was immediately available for consultation/collaboration.  Jontavius Rabalais M Dyamon Sosinski, MD 02/07/13 0742 

## 2013-02-27 ENCOUNTER — Telehealth: Payer: Self-pay | Admitting: Internal Medicine

## 2013-02-27 ENCOUNTER — Ambulatory Visit (INDEPENDENT_AMBULATORY_CARE_PROVIDER_SITE_OTHER): Payer: No Typology Code available for payment source | Admitting: Family Medicine

## 2013-02-27 DIAGNOSIS — R252 Cramp and spasm: Secondary | ICD-10-CM

## 2013-02-27 LAB — CBC WITH DIFFERENTIAL/PLATELET
Basophils Absolute: 0 10*3/uL (ref 0.0–0.1)
Basophils Relative: 0 % (ref 0–1)
Eosinophils Absolute: 0.2 10*3/uL (ref 0.0–0.7)
Eosinophils Relative: 2 % (ref 0–5)
HCT: 42.7 % (ref 39.0–52.0)
Hemoglobin: 14.3 g/dL (ref 13.0–17.0)
Lymphocytes Relative: 32 % (ref 12–46)
Lymphs Abs: 3.5 10*3/uL (ref 0.7–4.0)
MCH: 28.1 pg (ref 26.0–34.0)
MCHC: 33.5 g/dL (ref 30.0–36.0)
MCV: 83.9 fL (ref 78.0–100.0)
Monocytes Absolute: 0.9 10*3/uL (ref 0.1–1.0)
Monocytes Relative: 8 % (ref 3–12)
Neutro Abs: 6.5 10*3/uL (ref 1.7–7.7)
Neutrophils Relative %: 58 % (ref 43–77)
Platelets: 279 10*3/uL (ref 150–400)
RBC: 5.09 MIL/uL (ref 4.22–5.81)
RDW: 14.1 % (ref 11.5–15.5)
WBC: 11.2 10*3/uL — ABNORMAL HIGH (ref 4.0–10.5)

## 2013-02-27 LAB — COMPREHENSIVE METABOLIC PANEL
ALT: 23 U/L (ref 0–53)
AST: 22 U/L (ref 0–37)
Albumin: 3.6 g/dL (ref 3.5–5.2)
Alkaline Phosphatase: 34 U/L — ABNORMAL LOW (ref 39–117)
BUN: 20 mg/dL (ref 6–23)
CO2: 30 mEq/L (ref 19–32)
Calcium: 9.4 mg/dL (ref 8.4–10.5)
Chloride: 101 mEq/L (ref 96–112)
Creat: 0.86 mg/dL (ref 0.50–1.35)
Glucose, Bld: 108 mg/dL — ABNORMAL HIGH (ref 70–99)
Potassium: 4 mEq/L (ref 3.5–5.3)
Sodium: 138 mEq/L (ref 135–145)
Total Bilirubin: 0.4 mg/dL (ref 0.3–1.2)
Total Protein: 6.9 g/dL (ref 6.0–8.3)

## 2013-02-27 LAB — MAGNESIUM: Magnesium: 1.6 mg/dL (ref 1.5–2.5)

## 2013-02-27 MED ORDER — CYCLOBENZAPRINE HCL 10 MG PO TABS: 10.0000 mg | ORAL_TABLET | Freq: Three times a day (TID) | ORAL | Status: DC | PRN

## 2013-02-27 NOTE — Progress Notes (Signed)
  Subjective:    Patient ID: Aaron Castro, male    DOB: 01-19-1951, 62 y.o.   MRN: [id number]  HPI Is here for evaluation of bilateral thigh cramping. It is difficult to get a good history from him but apparently this is been going on for several months. He was seen recently in the emergency room and given a muscle relaxer which apparently did give him some benefit. He continues on his chronic pain medications.   Review of Systems     Objective:   Physical Exam Alert and in no distress. Palpation of the thighs showed no lesions or tenderness. No calf tenderness.       Assessment & Plan:  Leg cramps - Plan: CBC with Differential, Comprehensive metabolic panel, Magnesium, DISCONTINUED: cyclobenzaprine (FLEXERIL) 10 MG tablet  Followup pending results of blood work.

## 2013-02-27 NOTE — Patient Instructions (Signed)
Do heat to the area for 20 minutes 3 times per day and you can also use Tylenol . I will call in a muscle relaxer. Keep doing your stretching.

## 2013-02-27 NOTE — Telephone Encounter (Signed)
Med was no called in. So i sent in to pharmacy

## 2013-03-02 NOTE — Progress Notes (Signed)
Quick Note:  CALLED PT INFORMED HIM WORD FOR WORD Let him know that the labs are wnl. Have him continue with heat, stretching and if continued trouble , make an appt PT VERBALIZED UNDERSTANDING ______

## 2013-04-25 ENCOUNTER — Other Ambulatory Visit: Payer: Self-pay | Admitting: Family Medicine

## 2013-04-27 ENCOUNTER — Other Ambulatory Visit: Payer: Self-pay

## 2013-04-27 MED ORDER — TRAMADOL HCL 50 MG PO TABS: 50.0000 mg | ORAL_TABLET | Freq: Two times a day (BID) | ORAL | Status: DC

## 2013-04-27 NOTE — Telephone Encounter (Signed)
CALLED IN TRAMDOL

## 2013-04-27 NOTE — Telephone Encounter (Signed)
Is the tramadol ok

## 2013-05-19 ENCOUNTER — Telehealth: Payer: Self-pay | Admitting: Family Medicine

## 2013-05-19 NOTE — Telephone Encounter (Signed)
lm

## 2013-08-25 ENCOUNTER — Other Ambulatory Visit: Payer: Self-pay | Admitting: Family Medicine

## 2013-10-22 ENCOUNTER — Other Ambulatory Visit: Payer: Self-pay | Admitting: Family Medicine

## 2013-10-23 ENCOUNTER — Telehealth: Payer: Self-pay | Admitting: Family Medicine

## 2013-10-23 MED ORDER — LISINOPRIL-HYDROCHLOROTHIAZIDE 20-25 MG PO TABS: 1.0000 | ORAL_TABLET | Freq: Every day | ORAL | Status: DC

## 2013-10-23 MED ORDER — TRAMADOL HCL 50 MG PO TABS: ORAL_TABLET | ORAL | Status: DC

## 2013-10-23 NOTE — Telephone Encounter (Signed)
Pt requested meds refill on 4 meds, received message to schedule appt but meds not refilled. He scheduled med check for Monday morning but he is out of   Lisinopril and tramdol, please refill   [facility], [address]

## 2013-10-26 ENCOUNTER — Encounter: Payer: Self-pay | Admitting: Family Medicine

## 2013-10-26 ENCOUNTER — Ambulatory Visit (INDEPENDENT_AMBULATORY_CARE_PROVIDER_SITE_OTHER): Payer: Commercial Managed Care - HMO | Admitting: Family Medicine

## 2013-10-26 ENCOUNTER — Other Ambulatory Visit: Payer: Self-pay | Admitting: Family Medicine

## 2013-10-26 VITALS — BP 140/84 | HR 64 | Wt 388.0 lb

## 2013-10-26 DIAGNOSIS — IMO0002 Reserved for concepts with insufficient information to code with codable children: Secondary | ICD-10-CM

## 2013-10-26 DIAGNOSIS — M129 Arthropathy, unspecified: Secondary | ICD-10-CM

## 2013-10-26 DIAGNOSIS — Z23 Encounter for immunization: Secondary | ICD-10-CM

## 2013-10-26 DIAGNOSIS — I10 Essential (primary) hypertension: Secondary | ICD-10-CM

## 2013-10-26 DIAGNOSIS — M1990 Unspecified osteoarthritis, unspecified site: Secondary | ICD-10-CM

## 2013-10-26 DIAGNOSIS — G8929 Other chronic pain: Secondary | ICD-10-CM

## 2013-10-26 DIAGNOSIS — Z87820 Personal history of traumatic brain injury: Secondary | ICD-10-CM | POA: Insufficient documentation
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
## 2013-10-26 NOTE — Patient Instructions (Signed)
Take amlodipine and lisinopril once per day. Take your tramadol once in the morning and once in the afternoon Take your sulindac twice per day.

## 2013-11-15 ENCOUNTER — Other Ambulatory Visit: Payer: Self-pay | Admitting: Family Medicine

## 2013-11-17 NOTE — Telephone Encounter (Signed)
Is this ok to refill?  

## 2014-01-24 ENCOUNTER — Other Ambulatory Visit: Payer: Self-pay | Admitting: Family Medicine

## 2014-03-01 ENCOUNTER — Other Ambulatory Visit: Payer: Self-pay | Admitting: Family Medicine

## 2014-04-10 ENCOUNTER — Emergency Department (HOSPITAL_COMMUNITY): Payer: Medicare HMO

## 2014-04-10 ENCOUNTER — Encounter (HOSPITAL_COMMUNITY): Admission: EM | Disposition: A | Discharge status: Home Health | Payer: Self-pay | Source: Home / Self Care

## 2014-04-10 ENCOUNTER — Emergency Department (HOSPITAL_COMMUNITY): Payer: Medicare HMO | Admitting: Certified Registered Nurse Anesthetist

## 2014-04-10 ENCOUNTER — Encounter (HOSPITAL_COMMUNITY): Payer: Medicare HMO | Admitting: Certified Registered Nurse Anesthetist

## 2014-04-10 ENCOUNTER — Encounter (HOSPITAL_COMMUNITY): Payer: Self-pay | Admitting: Emergency Medicine

## 2014-04-10 ENCOUNTER — Inpatient Hospital Stay (HOSPITAL_COMMUNITY)
Admission: EM | Admit: 2014-04-10 | Discharge: 2014-04-21 | DRG: 958 | Disposition: A | Discharge status: Home Health | Payer: Medicare HMO | Attending: General Surgery | Admitting: General Surgery

## 2014-04-10 DIAGNOSIS — Y92003 Bedroom of unspecified non-institutional (private) residence as the place of occurrence of the external cause: Secondary | ICD-10-CM

## 2014-04-10 DIAGNOSIS — S36499A Other injury of unspecified part of small intestine, initial encounter: Secondary | ICD-10-CM | POA: Diagnosis present

## 2014-04-10 DIAGNOSIS — Z4659 Encounter for fitting and adjustment of other gastrointestinal appliance and device: Secondary | ICD-10-CM

## 2014-04-10 DIAGNOSIS — S32509A Unspecified fracture of unspecified pubis, initial encounter for closed fracture: Secondary | ICD-10-CM | POA: Diagnosis present

## 2014-04-10 DIAGNOSIS — Z6841 Body Mass Index (BMI) 40.0 and over, adult: Secondary | ICD-10-CM

## 2014-04-10 DIAGNOSIS — D72829 Elevated white blood cell count, unspecified: Secondary | ICD-10-CM

## 2014-04-10 DIAGNOSIS — S61204A Unspecified open wound of right ring finger without damage to nail, initial encounter: Secondary | ICD-10-CM | POA: Diagnosis present

## 2014-04-10 DIAGNOSIS — D62 Acute posthemorrhagic anemia: Secondary | ICD-10-CM | POA: Diagnosis not present

## 2014-04-10 DIAGNOSIS — S31139A Puncture wound of abdominal wall without foreign body, unspecified quadrant without penetration into peritoneal cavity, initial encounter: Secondary | ICD-10-CM

## 2014-04-10 DIAGNOSIS — S36409A Unspecified injury of unspecified part of small intestine, initial encounter: Secondary | ICD-10-CM | POA: Diagnosis present

## 2014-04-10 DIAGNOSIS — I10 Essential (primary) hypertension: Secondary | ICD-10-CM | POA: Diagnosis present

## 2014-04-10 DIAGNOSIS — S32501B Unspecified fracture of right pubis, initial encounter for open fracture: Secondary | ICD-10-CM | POA: Diagnosis present

## 2014-04-10 DIAGNOSIS — R5381 Other malaise: Secondary | ICD-10-CM

## 2014-04-10 DIAGNOSIS — W3400XA Accidental discharge from unspecified firearms or gun, initial encounter: Secondary | ICD-10-CM

## 2014-04-10 DIAGNOSIS — R1030 Lower abdominal pain, unspecified: Secondary | ICD-10-CM | POA: Diagnosis present

## 2014-04-10 DIAGNOSIS — K567 Ileus, unspecified: Secondary | ICD-10-CM | POA: Diagnosis not present

## 2014-04-10 DIAGNOSIS — K9189 Other postprocedural complications and disorders of digestive system: Secondary | ICD-10-CM

## 2014-04-10 DIAGNOSIS — S32501D Unspecified fracture of right pubis, subsequent encounter for fracture with routine healing: Secondary | ICD-10-CM

## 2014-04-10 HISTORY — PX: LAPAROTOMY: SHX154

## 2014-04-10 HISTORY — PX: BOWEL RESECTION: SHX1257

## 2014-04-10 LAB — PROTIME-INR
INR: 1.04 (ref 0.00–1.49)
Prothrombin Time: 13.8 seconds (ref 11.6–15.2)

## 2014-04-10 LAB — CBC
HCT: 42.6 % (ref 39.0–52.0)
HCT: 43.5 % (ref 39.0–52.0)
Hemoglobin: 13.9 g/dL (ref 13.0–17.0)
Hemoglobin: 14 g/dL (ref 13.0–17.0)
MCH: 27.6 pg (ref 26.0–34.0)
MCH: 27.7 pg (ref 26.0–34.0)
MCHC: 32.6 g/dL (ref 30.0–36.0)
MCHC: 32.2 g/dL (ref 30.0–36.0)
MCV: 84.7 fL (ref 78.0–100.0)
MCV: 86 fL (ref 78.0–100.0)
Platelets: 259 10*3/uL (ref 150–400)
Platelets: 229 10*3/uL (ref 150–400)
RBC: 5.03 MIL/uL (ref 4.22–5.81)
RBC: 5.06 MIL/uL (ref 4.22–5.81)
RDW: 13.6 % (ref 11.5–15.5)
RDW: 13.8 % (ref 11.5–15.5)
WBC: 15.4 10*3/uL — ABNORMAL HIGH (ref 4.0–10.5)
WBC: 17.9 10*3/uL — ABNORMAL HIGH (ref 4.0–10.5)

## 2014-04-10 LAB — ABO/RH: ABO/RH(D): A POS

## 2014-04-10 LAB — URINALYSIS, ROUTINE W REFLEX MICROSCOPIC
Bilirubin Urine: NEGATIVE
Glucose, UA: NEGATIVE mg/dL
Ketones, ur: NEGATIVE mg/dL
Leukocytes, UA: NEGATIVE
Nitrite: NEGATIVE
Protein, ur: NEGATIVE mg/dL
Specific Gravity, Urine: 1.026 (ref 1.005–1.030)
Urobilinogen, UA: 0.2 mg/dL (ref 0.0–1.0)
pH: 5 (ref 5.0–8.0)

## 2014-04-10 LAB — I-STAT CHEM 8, ED
BUN: 16 mg/dL (ref 6–23)
Calcium, Ion: 1.16 mmol/L (ref 1.13–1.30)
Chloride: 103 mEq/L (ref 96–112)
Creatinine, Ser: 0.8 mg/dL (ref 0.50–1.35)
Glucose, Bld: 150 mg/dL — ABNORMAL HIGH (ref 70–99)
HCT: 48 % (ref 39.0–52.0)
Hemoglobin: 16.3 g/dL (ref 13.0–17.0)
Potassium: 3.4 mEq/L — ABNORMAL LOW (ref 3.7–5.3)
Sodium: 139 mEq/L (ref 137–147)
TCO2: 26 mmol/L (ref 0–100)

## 2014-04-10 LAB — MRSA PCR SCREENING: MRSA by PCR: NEGATIVE

## 2014-04-10 LAB — COMPREHENSIVE METABOLIC PANEL
ALT: 21 U/L (ref 0–53)
AST: 28 U/L (ref 0–37)
Albumin: 3.4 g/dL — ABNORMAL LOW (ref 3.5–5.2)
Alkaline Phosphatase: 39 U/L (ref 39–117)
Anion gap: 13 (ref 5–15)
BUN: 17 mg/dL (ref 6–23)
CO2: 24 mEq/L (ref 19–32)
Calcium: 9.2 mg/dL (ref 8.4–10.5)
Chloride: 98 mEq/L (ref 96–112)
Creatinine, Ser: 0.84 mg/dL (ref 0.50–1.35)
GFR calc Af Amer: >90 mL/min (ref >90)
GFR calc non Af Amer: 90 mL/min — ABNORMAL LOW (ref >90)
Glucose, Bld: 149 mg/dL — ABNORMAL HIGH (ref 70–99)
Potassium: 3.8 mEq/L (ref 3.7–5.3)
Sodium: 135 mEq/L — ABNORMAL LOW (ref 137–147)
Total Bilirubin: 0.3 mg/dL (ref 0.3–1.2)
Total Protein: 7.9 g/dL (ref 6.0–8.3)

## 2014-04-10 LAB — TYPE AND SCREEN
ABO/RH(D): A POS
Antibody Screen: NEGATIVE
Unit division: 0
Unit division: 0

## 2014-04-10 LAB — BASIC METABOLIC PANEL
Anion gap: 15 (ref 5–15)
BUN: 15 mg/dL (ref 6–23)
CO2: 24 mEq/L (ref 19–32)
Calcium: 8.8 mg/dL (ref 8.4–10.5)
Chloride: 99 mEq/L (ref 96–112)
Creatinine, Ser: 0.88 mg/dL (ref 0.50–1.35)
GFR calc Af Amer: >90 mL/min (ref >90)
GFR calc non Af Amer: 88 mL/min — ABNORMAL LOW (ref >90)
Glucose, Bld: 156 mg/dL — ABNORMAL HIGH (ref 70–99)
Potassium: 4.2 mEq/L (ref 3.7–5.3)
Sodium: 138 mEq/L (ref 137–147)

## 2014-04-10 LAB — PREPARE FRESH FROZEN PLASMA
Unit division: 0
Unit division: 0

## 2014-04-10 LAB — URINE MICROSCOPIC-ADD ON

## 2014-04-10 LAB — GLUCOSE, CAPILLARY
Glucose-Capillary: 144 mg/dL — ABNORMAL HIGH (ref 70–99)
Glucose-Capillary: 137 mg/dL — ABNORMAL HIGH (ref 70–99)
Glucose-Capillary: 146 mg/dL — ABNORMAL HIGH (ref 70–99)

## 2014-04-10 LAB — I-STAT TROPONIN, ED: Troponin i, poc: 0.01 ng/mL (ref 0.00–0.08)

## 2014-04-10 LAB — CDS SEROLOGY

## 2014-04-10 LAB — ETHANOL: Alcohol, Ethyl (B): <11 mg/dL (ref 0–11)
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
## 2014-04-10 SURGERY — LAPAROTOMY, EXPLORATORY
Anesthesia: General | Site: Abdomen

## 2014-04-10 MED ORDER — IOHEXOL 300 MG/ML  SOLN
100.0000 mL | Freq: Once | INTRAMUSCULAR | Status: AC | PRN
  Administered 2014-04-10: 100 mL via INTRAVENOUS

## 2014-04-10 MED ORDER — LIDOCAINE HCL (CARDIAC) 20 MG/ML IV SOLN
INTRAVENOUS | Status: AC
  Filled 2014-04-10: qty 5

## 2014-04-10 MED ORDER — PANTOPRAZOLE SODIUM 40 MG PO TBEC
40.0000 mg | DELAYED_RELEASE_TABLET | Freq: Every day | ORAL | Status: DC
Start: 2014-04-10 — End: 2014-04-21
  Administered 2014-04-16 – 2014-04-21 (×6): 40 mg via ORAL
  Filled 2014-04-10 (×7): qty 1

## 2014-04-10 MED ORDER — PANTOPRAZOLE SODIUM 40 MG IV SOLR
40.0000 mg | Freq: Every day | INTRAVENOUS | Status: DC
Start: 2014-04-10 — End: 2014-04-18
  Administered 2014-04-10 – 2014-04-15 (×6): 40 mg via INTRAVENOUS
  Filled 2014-04-10 (×10): qty 40

## 2014-04-10 MED ORDER — ONDANSETRON HCL 4 MG/2ML IJ SOLN: 4.0000 mg | Freq: Four times a day (QID) | INTRAMUSCULAR | Status: DC | PRN

## 2014-04-10 MED ORDER — WHITE PETROLATUM GEL
Status: AC
  Administered 2014-04-10: 0.2
  Filled 2014-04-10: qty 5

## 2014-04-10 MED ORDER — PIPERACILLIN-TAZOBACTAM 3.375 G IVPB
3.3750 g | Freq: Once | INTRAVENOUS | Status: AC
  Administered 2014-04-10: 3.375 g via INTRAVENOUS
  Filled 2014-04-10: qty 50

## 2014-04-10 MED ORDER — PHENYLEPHRINE 40 MCG/ML (10ML) SYRINGE FOR IV PUSH (FOR BLOOD PRESSURE SUPPORT)
PREFILLED_SYRINGE | INTRAVENOUS | Status: AC
  Filled 2014-04-10: qty 10

## 2014-04-10 MED ORDER — NALOXONE HCL 0.4 MG/ML IJ SOLN: 0.4000 mg | INTRAMUSCULAR | Status: DC | PRN

## 2014-04-10 MED ORDER — SUCCINYLCHOLINE CHLORIDE 20 MG/ML IJ SOLN
INTRAMUSCULAR | Status: DC | PRN
  Administered 2014-04-10: 170 mg via INTRAVENOUS

## 2014-04-10 MED ORDER — HYDROMORPHONE HCL 1 MG/ML IJ SOLN
INTRAMUSCULAR | Status: AC
  Administered 2014-04-10: 0.5 mg via INTRAVENOUS
  Filled 2014-04-10: qty 1

## 2014-04-10 MED ORDER — DIPHENHYDRAMINE HCL 50 MG/ML IJ SOLN: 12.5000 mg | Freq: Four times a day (QID) | INTRAMUSCULAR | Status: DC | PRN

## 2014-04-10 MED ORDER — NEOSTIGMINE METHYLSULFATE 10 MG/10ML IV SOLN
INTRAVENOUS | Status: DC | PRN
  Administered 2014-04-10: 5 mg via INTRAVENOUS

## 2014-04-10 MED ORDER — LIDOCAINE HCL (CARDIAC) 20 MG/ML IV SOLN
INTRAVENOUS | Status: DC | PRN
  Administered 2014-04-10: 100 mg via INTRAVENOUS

## 2014-04-10 MED ORDER — ONDANSETRON HCL 4 MG/2ML IJ SOLN
INTRAMUSCULAR | Status: AC
  Filled 2014-04-10: qty 2

## 2014-04-10 MED ORDER — VECURONIUM BROMIDE 10 MG IV SOLR
INTRAVENOUS | Status: AC
  Filled 2014-04-10: qty 10

## 2014-04-10 MED ORDER — GLYCOPYRROLATE 0.2 MG/ML IJ SOLN
INTRAMUSCULAR | Status: DC | PRN
  Administered 2014-04-10: .8 mg via INTRAVENOUS

## 2014-04-10 MED ORDER — DEXTROSE 5 % IV SOLN
INTRAVENOUS | Status: DC | PRN
  Administered 2014-04-10: 09:00:00 via INTRAVENOUS

## 2014-04-10 MED ORDER — FENTANYL CITRATE 0.05 MG/ML IJ SOLN
INTRAMUSCULAR | Status: DC | PRN
  Administered 2014-04-10 (×2): 50 ug via INTRAVENOUS
  Administered 2014-04-10 (×2): 250 ug via INTRAVENOUS

## 2014-04-10 MED ORDER — ALBUMIN HUMAN 5 % IV SOLN
INTRAVENOUS | Status: DC | PRN
  Administered 2014-04-10: 09:00:00 via INTRAVENOUS

## 2014-04-10 MED ORDER — SUCCINYLCHOLINE CHLORIDE 20 MG/ML IJ SOLN
INTRAMUSCULAR | Status: AC
  Filled 2014-04-10: qty 1

## 2014-04-10 MED ORDER — PHENYLEPHRINE HCL 10 MG/ML IJ SOLN
INTRAMUSCULAR | Status: DC | PRN
  Administered 2014-04-10: 80 ug via INTRAVENOUS

## 2014-04-10 MED ORDER — EPHEDRINE SULFATE 50 MG/ML IJ SOLN
INTRAMUSCULAR | Status: AC
  Filled 2014-04-10: qty 1

## 2014-04-10 MED ORDER — STERILE WATER FOR INJECTION IJ SOLN
INTRAMUSCULAR | Status: AC
  Filled 2014-04-10: qty 10

## 2014-04-10 MED ORDER — HYDROMORPHONE 0.3 MG/ML IV SOLN
INTRAVENOUS | Status: DC
Start: 2014-04-10 — End: 2014-04-11
  Administered 2014-04-10: 0.9 mg via INTRAVENOUS
  Administered 2014-04-11: 1.2 mg via INTRAVENOUS
  Administered 2014-04-11: 0.9 mg via INTRAVENOUS
  Administered 2014-04-11: 1.8 mg via INTRAVENOUS
  Filled 2014-04-10: qty 25

## 2014-04-10 MED ORDER — METHYLENE BLUE 1 % INJ SOLN
INTRAMUSCULAR | Status: DC | PRN
  Administered 2014-04-10: 10 mL

## 2014-04-10 MED ORDER — PROPOFOL 10 MG/ML IV BOLUS
INTRAVENOUS | Status: DC | PRN
  Administered 2014-04-10: 40 mg via INTRAVENOUS
  Administered 2014-04-10: 10 mg via INTRAVENOUS
  Administered 2014-04-10: 200 mg via INTRAVENOUS

## 2014-04-10 MED ORDER — ROCURONIUM BROMIDE 50 MG/5ML IV SOLN
INTRAVENOUS | Status: AC
  Filled 2014-04-10: qty 1

## 2014-04-10 MED ORDER — FENTANYL CITRATE 0.05 MG/ML IJ SOLN
INTRAMUSCULAR | Status: AC
  Filled 2014-04-10: qty 5

## 2014-04-10 MED ORDER — TETANUS-DIPHTH-ACELL PERTUSSIS 5-2.5-18.5 LF-MCG/0.5 IM SUSP
0.5000 mL | Freq: Once | INTRAMUSCULAR | Status: AC
  Administered 2014-04-10: 0.5 mL via INTRAMUSCULAR
  Filled 2014-04-10: qty 0.5

## 2014-04-10 MED ORDER — NEOSTIGMINE METHYLSULFATE 10 MG/10ML IV SOLN
INTRAVENOUS | Status: AC
  Filled 2014-04-10: qty 1

## 2014-04-10 MED ORDER — DIPHENHYDRAMINE HCL 12.5 MG/5ML PO ELIX
12.5000 mg | ORAL_SOLUTION | Freq: Four times a day (QID) | ORAL | Status: DC | PRN
  Filled 2014-04-10: qty 5

## 2014-04-10 MED ORDER — ONDANSETRON HCL 4 MG/2ML IJ SOLN
INTRAMUSCULAR | Status: AC
  Administered 2014-04-10: 4 mg via INTRAVENOUS
  Filled 2014-04-10: qty 2

## 2014-04-10 MED ORDER — MIDAZOLAM HCL 2 MG/2ML IJ SOLN
INTRAMUSCULAR | Status: AC
  Filled 2014-04-10: qty 2

## 2014-04-10 MED ORDER — DEXAMETHASONE SODIUM PHOSPHATE 4 MG/ML IJ SOLN
INTRAMUSCULAR | Status: DC | PRN
  Administered 2014-04-10: 4 mg via INTRAVENOUS

## 2014-04-10 MED ORDER — SODIUM CHLORIDE 0.9 % IJ SOLN: 9.0000 mL | INTRAMUSCULAR | Status: DC | PRN

## 2014-04-10 MED ORDER — KCL IN DEXTROSE-NACL 20-5-0.45 MEQ/L-%-% IV SOLN
INTRAVENOUS | Status: DC
Start: 2014-04-10 — End: 2014-04-21
  Administered 2014-04-10: 100 mL/h via INTRAVENOUS
  Administered 2014-04-11 – 2014-04-16 (×13): via INTRAVENOUS
  Filled 2014-04-10 (×19): qty 1000

## 2014-04-10 MED ORDER — METHYLENE BLUE 1 % INJ SOLN
INTRAMUSCULAR | Status: AC
  Filled 2014-04-10: qty 10

## 2014-04-10 MED ORDER — ENOXAPARIN SODIUM 40 MG/0.4ML ~~LOC~~ SOLN
40.0000 mg | SUBCUTANEOUS | Status: DC
  Administered 2014-04-11 – 2014-04-14 (×4): 40 mg via SUBCUTANEOUS
  Filled 2014-04-10 (×5): qty 0.4

## 2014-04-10 MED ORDER — LACTATED RINGERS IV SOLN
INTRAVENOUS | Status: DC | PRN
  Administered 2014-04-10 (×2): via INTRAVENOUS

## 2014-04-10 MED ORDER — 0.9 % SODIUM CHLORIDE (POUR BTL) OPTIME
TOPICAL | Status: DC | PRN
  Administered 2014-04-10 (×3): 1000 mL

## 2014-04-10 MED ORDER — HYDROMORPHONE 0.3 MG/ML IV SOLN
INTRAVENOUS | Status: AC
  Filled 2014-04-10: qty 25

## 2014-04-10 MED ORDER — PIPERACILLIN-TAZOBACTAM 3.375 G IVPB: 3.3750 g | Freq: Three times a day (TID) | INTRAVENOUS | Status: DC

## 2014-04-10 MED ORDER — ONDANSETRON HCL 4 MG/2ML IJ SOLN
4.0000 mg | Freq: Once | INTRAMUSCULAR | Status: AC
  Administered 2014-04-10: 4 mg via INTRAVENOUS

## 2014-04-10 MED ORDER — HYDROMORPHONE 0.3 MG/ML IV SOLN
INTRAVENOUS | Status: DC
  Administered 2014-04-10: 0.6 mg via INTRAVENOUS
  Administered 2014-04-10: 11:00:00 via INTRAVENOUS
  Administered 2014-04-10: 0.6 mg via INTRAVENOUS

## 2014-04-10 MED ORDER — ONDANSETRON HCL 4 MG/2ML IJ SOLN
INTRAMUSCULAR | Status: DC | PRN
Start: 2014-04-10 — End: 2014-04-10
  Administered 2014-04-10: 4 mg via INTRAVENOUS

## 2014-04-10 MED ORDER — GLYCOPYRROLATE 0.2 MG/ML IJ SOLN
INTRAMUSCULAR | Status: AC
  Filled 2014-04-10: qty 4

## 2014-04-10 MED ORDER — HYDROMORPHONE HCL 1 MG/ML IJ SOLN
0.2500 mg | INTRAMUSCULAR | Status: DC | PRN
  Administered 2014-04-10 (×2): 0.5 mg via INTRAVENOUS

## 2014-04-10 MED ORDER — PROPOFOL 10 MG/ML IV BOLUS
INTRAVENOUS | Status: AC
  Filled 2014-04-10: qty 20

## 2014-04-10 MED ORDER — LABETALOL HCL 5 MG/ML IV SOLN
5.0000 mg | INTRAVENOUS | Status: DC | PRN
  Administered 2014-04-10 – 2014-04-11 (×2): 5 mg via INTRAVENOUS
  Filled 2014-04-10 (×3): qty 4

## 2014-04-10 MED ORDER — ONDANSETRON HCL 4 MG PO TABS: 4.0000 mg | ORAL_TABLET | Freq: Four times a day (QID) | ORAL | Status: DC | PRN

## 2014-04-10 MED ORDER — HYDROMORPHONE HCL 1 MG/ML IJ SOLN
1.0000 mg | Freq: Once | INTRAMUSCULAR | Status: AC
Start: 2014-04-10 — End: 2014-04-10
  Administered 2014-04-10: 1 mg via INTRAVENOUS
  Filled 2014-04-10: qty 1

## 2014-04-10 MED ORDER — IPRATROPIUM-ALBUTEROL 0.5-2.5 (3) MG/3ML IN SOLN: 3.0000 mL | Freq: Four times a day (QID) | RESPIRATORY_TRACT | Status: DC | PRN

## 2014-04-10 MED ORDER — PIPERACILLIN-TAZOBACTAM 3.375 G IVPB
3.3750 g | Freq: Three times a day (TID) | INTRAVENOUS | Status: AC
  Administered 2014-04-10 – 2014-04-11 (×3): 3.375 g via INTRAVENOUS
  Filled 2014-04-10 (×3): qty 50

## 2014-04-10 MED ORDER — MIDAZOLAM HCL 5 MG/5ML IJ SOLN
INTRAMUSCULAR | Status: DC | PRN
  Administered 2014-04-10: 2 mg via INTRAVENOUS

## 2014-04-10 MED ORDER — ONDANSETRON HCL 4 MG/2ML IJ SOLN: 4.0000 mg | Freq: Once | INTRAMUSCULAR | Status: DC | PRN

## 2014-04-10 MED ORDER — ROCURONIUM BROMIDE 100 MG/10ML IV SOLN
INTRAVENOUS | Status: DC | PRN
  Administered 2014-04-10: 50 mg via INTRAVENOUS

## 2014-04-10 SURGICAL SUPPLY — 71 items
BLADE SURG ROTATE 9660 (MISCELLANEOUS) IMPLANT
BNDG COHESIVE 1X5 TAN STRL LF (GAUZE/BANDAGES/DRESSINGS) ×3 IMPLANT
BNDG CONFORM 2 STRL LF (GAUZE/BANDAGES/DRESSINGS) ×3 IMPLANT
BNDG GAUZE ELAST 4 BULKY (GAUZE/BANDAGES/DRESSINGS) ×3 IMPLANT
CANISTER SUCTION 2500CC (MISCELLANEOUS) ×3 IMPLANT
CHLORAPREP W/TINT 26ML (MISCELLANEOUS) ×3 IMPLANT
CONT SPEC 4OZ CLIKSEAL STRL BL (MISCELLANEOUS) ×3 IMPLANT
COVER MAYO STAND STRL (DRAPES) IMPLANT
COVER SURGICAL LIGHT HANDLE (MISCELLANEOUS) ×3 IMPLANT
DRAIN CHANNEL 19F RND (DRAIN) ×3 IMPLANT
DRAPE LAPAROSCOPIC ABDOMINAL (DRAPES) ×3 IMPLANT
DRAPE LAPAROTOMY TRNSV 102X78 (DRAPE) ×3 IMPLANT
DRAPE PROXIMA HALF (DRAPES) ×3 IMPLANT
DRAPE UTILITY 15X26 W/TAPE STR (DRAPE) ×6 IMPLANT
DRAPE WARM FLUID 44X44 (DRAPE) ×3 IMPLANT
DRSG OPSITE POSTOP 4X10 (GAUZE/BANDAGES/DRESSINGS) IMPLANT
DRSG OPSITE POSTOP 4X8 (GAUZE/BANDAGES/DRESSINGS) IMPLANT
ELECT BLADE 6.5 EXT (BLADE) IMPLANT
ELECT CAUTERY BLADE 6.4 (BLADE) ×6 IMPLANT
ELECT REM PT RETURN 9FT ADLT (ELECTROSURGICAL) ×3
ELECTRODE REM PT RTRN 9FT ADLT (ELECTROSURGICAL) ×2 IMPLANT
EVACUATOR SILICONE 100CC (DRAIN) ×3 IMPLANT
GAUZE PACKING FOLDED 2  STR (GAUZE/BANDAGES/DRESSINGS) ×1
GAUZE PACKING FOLDED 2 STR (GAUZE/BANDAGES/DRESSINGS) ×2 IMPLANT
GAUZE SPONGE 2X2 8PLY STRL LF (GAUZE/BANDAGES/DRESSINGS) ×2 IMPLANT
GLOVE BIO SURGEON STRL SZ7 (GLOVE) ×3 IMPLANT
GLOVE BIO SURGEON STRL SZ8 (GLOVE) ×6 IMPLANT
GLOVE BIOGEL PI IND STRL 7.0 (GLOVE) ×2 IMPLANT
GLOVE BIOGEL PI IND STRL 7.5 (GLOVE) ×6 IMPLANT
GLOVE BIOGEL PI IND STRL 8 (GLOVE) ×4 IMPLANT
GLOVE BIOGEL PI INDICATOR 7.0 (GLOVE) ×1
GLOVE BIOGEL PI INDICATOR 7.5 (GLOVE) ×3
GLOVE BIOGEL PI INDICATOR 8 (GLOVE) ×2
GLOVE EUDERMIC 7 POWDERFREE (GLOVE) ×3 IMPLANT
GLOVE SS BIOGEL STRL SZ 7.5 (GLOVE) ×4 IMPLANT
GLOVE SUPERSENSE BIOGEL SZ 7.5 (GLOVE) ×2
GOWN STRL REUS W/ TWL LRG LVL3 (GOWN DISPOSABLE) ×4 IMPLANT
GOWN STRL REUS W/ TWL XL LVL3 (GOWN DISPOSABLE) ×2 IMPLANT
GOWN STRL REUS W/TWL LRG LVL3 (GOWN DISPOSABLE) ×2
GOWN STRL REUS W/TWL XL LVL3 (GOWN DISPOSABLE) ×1
HEMOSTAT SNOW SURGICEL 2X4 (HEMOSTASIS) ×3 IMPLANT
KIT BASIN OR (CUSTOM PROCEDURE TRAY) ×3 IMPLANT
KIT ROOM TURNOVER OR (KITS) ×3 IMPLANT
LIGASURE IMPACT 36 18CM CVD LR (INSTRUMENTS) ×3 IMPLANT
NS IRRIG 1000ML POUR BTL (IV SOLUTION) ×9 IMPLANT
PACK GENERAL/GYN (CUSTOM PROCEDURE TRAY) ×3 IMPLANT
PAD ARMBOARD 7.5X6 YLW CONV (MISCELLANEOUS) ×3 IMPLANT
PENCIL BUTTON HOLSTER BLD 10FT (ELECTRODE) IMPLANT
RELOAD PROXIMATE 75MM BLUE (ENDOMECHANICALS) ×6 IMPLANT
RELOAD PROXIMATE TA60MM BLUE (ENDOMECHANICALS) ×3 IMPLANT
SPECIMEN JAR LARGE (MISCELLANEOUS) IMPLANT
SPONGE GAUZE 2X2 STER 10/PKG (GAUZE/BANDAGES/DRESSINGS) ×1
SPONGE GAUZE 4X4 12PLY STER LF (GAUZE/BANDAGES/DRESSINGS) ×3 IMPLANT
SPONGE LAP 18X18 X RAY DECT (DISPOSABLE) IMPLANT
STAPLER GUN LINEAR PROX 60 (STAPLE) ×3 IMPLANT
STAPLER PROXIMATE 75MM BLUE (STAPLE) ×3 IMPLANT
STAPLER VISISTAT (STAPLE) ×3 IMPLANT
STAPLER VISISTAT 35W (STAPLE) ×3 IMPLANT
SUCTION POOLE TIP (SUCTIONS) ×3 IMPLANT
SUT ETHILON 3 0 PS 1 (SUTURE) ×3 IMPLANT
SUT PDS AB 1 TP1 96 (SUTURE) ×12 IMPLANT
SUT SILK 2 0 SH CR/8 (SUTURE) ×6 IMPLANT
SUT SILK 2 0 TIES 10X30 (SUTURE) ×3 IMPLANT
SUT SILK 3 0 SH CR/8 (SUTURE) ×3 IMPLANT
SUT SILK 3 0 TIES 10X30 (SUTURE) ×3 IMPLANT
SYRINGE 10CC LL (SYRINGE) ×3 IMPLANT
SYRINGE 60CC CATHETER 2 OZ (MISCELLANEOUS) ×3 IMPLANT
TOWEL OR 17X26 10 PK STRL BLUE (TOWEL DISPOSABLE) ×3 IMPLANT
TRAY FOLEY CATH 16FRSI W/METER (SET/KITS/TRAYS/PACK) IMPLANT
TUBE CONNECTING 12X1/4 (SUCTIONS) IMPLANT
YANKAUER SUCT BULB TIP NO VENT (SUCTIONS) ×3 IMPLANT
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
## 2014-04-10 NOTE — Transfer of Care (Signed)
Immediate Anesthesia Transfer of Care Note  Patient: [name]  Procedure(s) Performed: Procedure(s): EXPLORATORY LAPAROTOMY (N/A) SMALL BOWEL RESECTION WITH COLON REPAIR (N/A)  Patient Location: PACU  Anesthesia Type:General  Level of Consciousness: awake, patient cooperative and responds to stimulation  Airway & Oxygen Therapy: Patient Spontanous Breathing and Patient connected to face mask oxygen  Post-op Assessment: Report given to PACU RN, Post -op Vital signs reviewed and stable and Patient moving all extremities X 4  Post vital signs: Reviewed and stable  Complications: No apparent anesthesia complications
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
## 2014-04-10 NOTE — Anesthesia Postprocedure Evaluation (Signed)
  Anesthesia Post-op Note  Patient: Aaron Castro  Procedure(s) Performed: Procedure(s): EXPLORATORY LAPAROTOMY (N/A) SMALL BOWEL RESECTION WITH COLON REPAIR (N/A)  Patient Location: PACU  Anesthesia Type:General  Level of Consciousness: awake, oriented, sedated and patient cooperative  Airway and Oxygen Therapy: Patient Spontanous Breathing  Post-op Pain: moderate  Post-op Assessment: Post-op Vital signs reviewed, Patient's Cardiovascular Status Stable, Respiratory Function Stable, Patent Airway, No signs of Nausea or vomiting and Pain level controlled  Post-op Vital Signs: stable  Last Vitals:  Filed Vitals:   04/10/14 1058  BP: 155/90  Pulse: 87  Temp: 36.6 C  Resp: 24    Complications: No apparent anesthesia complications

## 2014-04-11 ENCOUNTER — Inpatient Hospital Stay (HOSPITAL_COMMUNITY): Payer: Medicare HMO

## 2014-04-11 LAB — GLUCOSE, CAPILLARY
Glucose-Capillary: 115 mg/dL — ABNORMAL HIGH (ref 70–99)
Glucose-Capillary: 119 mg/dL — ABNORMAL HIGH (ref 70–99)
Glucose-Capillary: 125 mg/dL — ABNORMAL HIGH (ref 70–99)
Glucose-Capillary: 130 mg/dL — ABNORMAL HIGH (ref 70–99)
Glucose-Capillary: 146 mg/dL — ABNORMAL HIGH (ref 70–99)
Glucose-Capillary: 152 mg/dL — ABNORMAL HIGH (ref 70–99)

## 2014-04-11 LAB — BASIC METABOLIC PANEL
Anion gap: 12 (ref 5–15)
BUN: 15 mg/dL (ref 6–23)
CO2: 26 mEq/L (ref 19–32)
Calcium: 8.6 mg/dL (ref 8.4–10.5)
Chloride: 100 mEq/L (ref 96–112)
Creatinine, Ser: 0.98 mg/dL (ref 0.50–1.35)
GFR calc Af Amer: >90 mL/min (ref >90)
GFR calc non Af Amer: 84 mL/min — ABNORMAL LOW (ref >90)
Glucose, Bld: 146 mg/dL — ABNORMAL HIGH (ref 70–99)
Potassium: 3.9 mEq/L (ref 3.7–5.3)
Sodium: 138 mEq/L (ref 137–147)

## 2014-04-11 LAB — CBC
HCT: 42.3 % (ref 39.0–52.0)
Hemoglobin: 13.6 g/dL (ref 13.0–17.0)
MCH: 27.5 pg (ref 26.0–34.0)
MCHC: 32.2 g/dL (ref 30.0–36.0)
MCV: 85.6 fL (ref 78.0–100.0)
Platelets: 243 10*3/uL (ref 150–400)
RBC: 4.94 MIL/uL (ref 4.22–5.81)
RDW: 13.9 % (ref 11.5–15.5)
WBC: 18.5 10*3/uL — ABNORMAL HIGH (ref 4.0–10.5)

## 2014-04-11 IMAGING — CR DG ABD PORTABLE 1V
1 series · 1 of 1 positions shown · non-contrast
Comparison: [DATE]

CLINICAL DATA: Assess NG tube placement.

EXAM:
PORTABLE ABDOMEN - 1 VIEW

[AP]
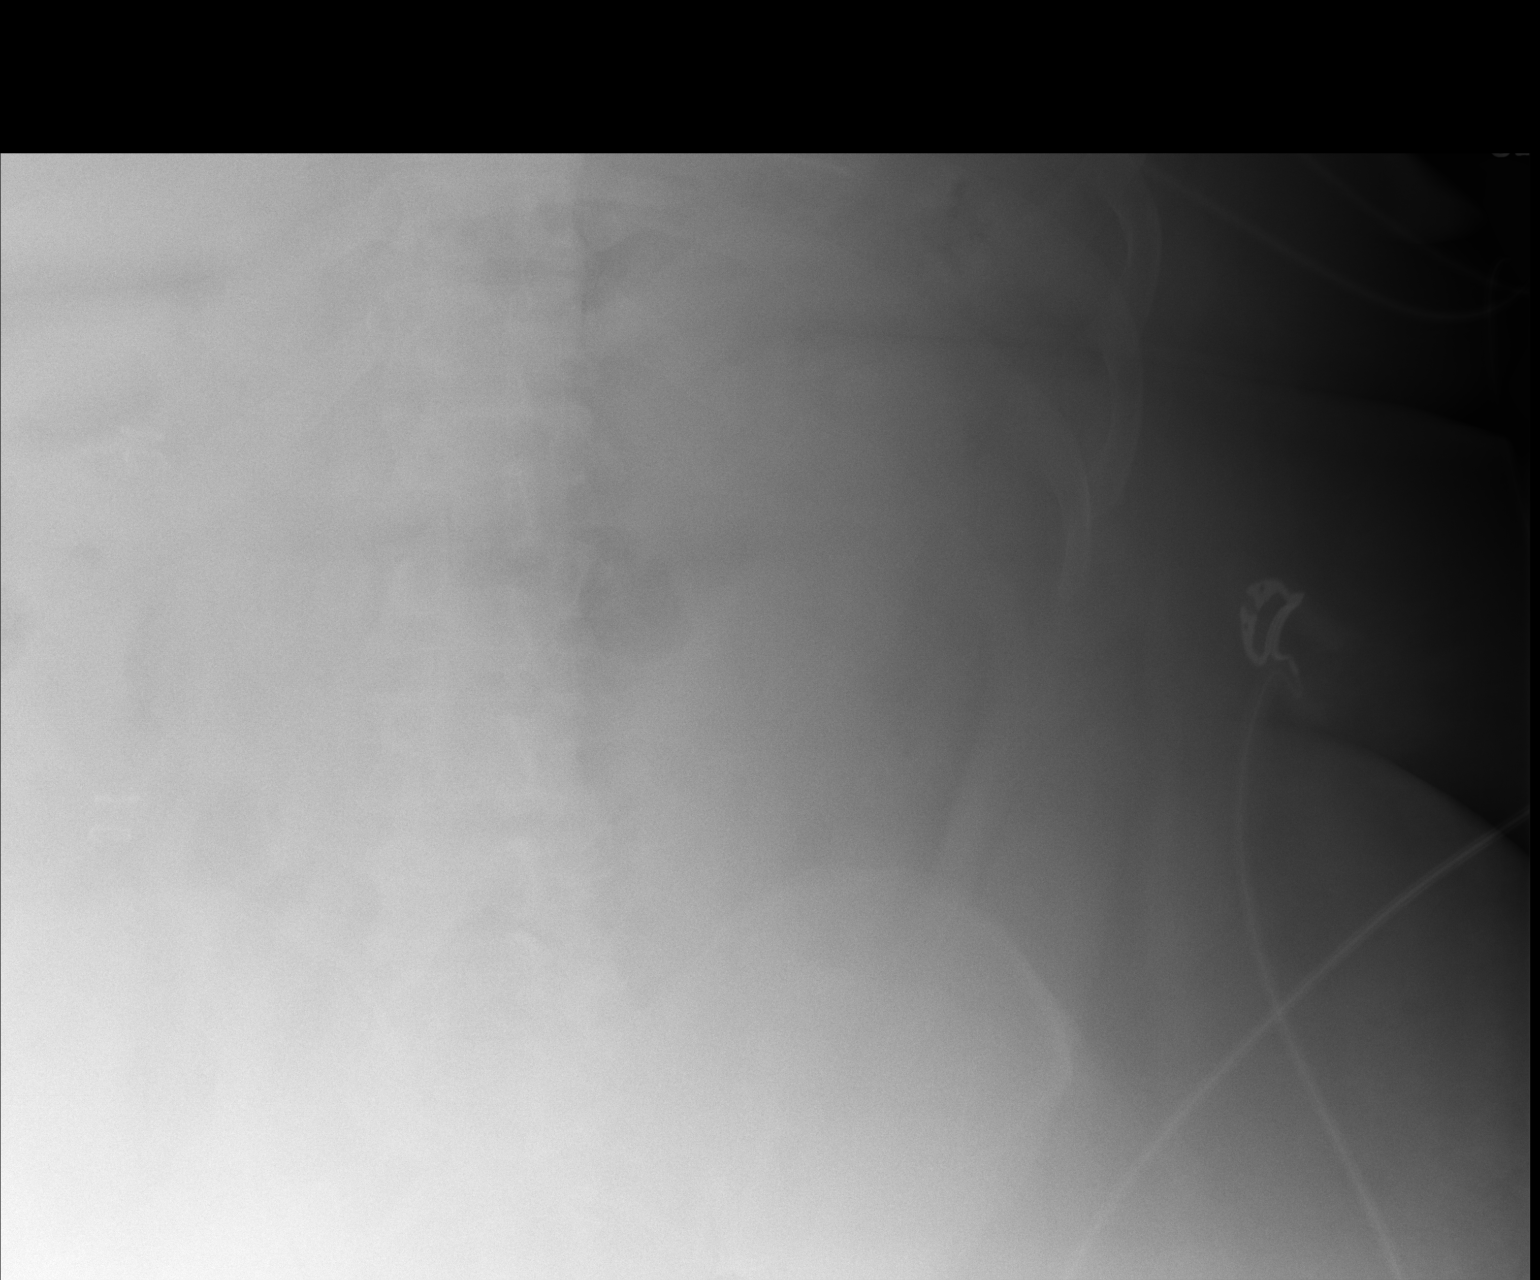

[1 of 1 positions shown; findings below may reference images not displayed]

FINDINGS: Nasogastric tube is present with tip overlying the stomach in the
left upper quadrant and side-port in the region of the
gastroesophageal junction. There are upon overlying soft tissues.
Bowel gas pattern is nonobstructive. Skin staples are present over
the right abdomen.
IMPRESSION: Nasogastric tube with tip over the stomach in the left upper
quadrant and side-port in the region of the gastroesophageal
junction.

## 2014-04-11 MED ORDER — METOPROLOL TARTRATE 1 MG/ML IV SOLN
5.0000 mg | Freq: Four times a day (QID) | INTRAVENOUS | Status: DC
Start: 2014-04-11 — End: 2014-04-16
  Administered 2014-04-11 – 2014-04-16 (×19): 5 mg via INTRAVENOUS
  Filled 2014-04-11 (×26): qty 5

## 2014-04-11 MED ORDER — INFLUENZA VAC SPLIT QUAD 0.5 ML IM SUSY
0.5000 mL | PREFILLED_SYRINGE | INTRAMUSCULAR | Status: AC
  Administered 2014-04-12: 0.5 mL via INTRAMUSCULAR
  Filled 2014-04-11: qty 0.5

## 2014-04-11 MED ORDER — CETYLPYRIDINIUM CHLORIDE 0.05 % MT LIQD
7.0000 mL | Freq: Two times a day (BID) | OROMUCOSAL | Status: DC
  Administered 2014-04-11 – 2014-04-19 (×14): 7 mL via OROMUCOSAL

## 2014-04-11 MED ORDER — PNEUMOCOCCAL VAC POLYVALENT 25 MCG/0.5ML IJ INJ
0.5000 mL | INJECTION | INTRAMUSCULAR | Status: AC
  Administered 2014-04-12: 0.5 mL via INTRAMUSCULAR
  Filled 2014-04-11: qty 0.5

## 2014-04-11 MED ORDER — HYDROMORPHONE HCL 1 MG/ML IJ SOLN
0.5000 mg | INTRAMUSCULAR | Status: DC | PRN
  Administered 2014-04-11: 1 mg via INTRAVENOUS
  Administered 2014-04-11: 0.5 mg via INTRAVENOUS
  Administered 2014-04-11: 1 mg via INTRAVENOUS
  Administered 2014-04-11 – 2014-04-12 (×5): 0.5 mg via INTRAVENOUS
  Administered 2014-04-12: 1 mg via INTRAVENOUS
  Administered 2014-04-12: 0.5 mg via INTRAVENOUS
  Administered 2014-04-12 – 2014-04-13 (×7): 1 mg via INTRAVENOUS
  Filled 2014-04-11 (×18): qty 1
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
## 2014-04-12 ENCOUNTER — Encounter (HOSPITAL_COMMUNITY): Payer: Self-pay | Admitting: *Deleted

## 2014-04-12 DIAGNOSIS — S36499A Other injury of unspecified part of small intestine, initial encounter: Secondary | ICD-10-CM | POA: Diagnosis not present

## 2014-04-12 LAB — GLUCOSE, CAPILLARY
Glucose-Capillary: 113 mg/dL — ABNORMAL HIGH (ref 70–99)
Glucose-Capillary: 127 mg/dL — ABNORMAL HIGH (ref 70–99)
Glucose-Capillary: 127 mg/dL — ABNORMAL HIGH (ref 70–99)
Glucose-Capillary: 128 mg/dL — ABNORMAL HIGH (ref 70–99)
Glucose-Capillary: 99 mg/dL (ref 70–99)

## 2014-04-12 NOTE — Progress Notes (Signed)
Attempted to dangle patient at bedside with the assistance of PT and additional RN; attempt unsuccessful due to patient becoming diaphoretic and unable to pull self to a sitting position.  Will notify MD.  Burnard BuntingKatrice Marshayla Mitschke, RN
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
## 2014-04-12 NOTE — Plan of Care (Signed)
Problem: Phase I Progression Outcomes Goal: OOB as tolerated unless otherwise ordered Outcome: Not Progressing Pt unable to dangle at bedside due to inability to assume and tolerate sitting position; attempted with PT and RNs x 2; md aware

## 2014-04-12 NOTE — Progress Notes (Signed)
UR completed.  Rodman Recupero, RN BSN MHA CCM Trauma/Neuro ICU Case Manager [phone]  

## 2014-04-13 ENCOUNTER — Encounter (HOSPITAL_COMMUNITY): Payer: Self-pay | Admitting: General Surgery

## 2014-04-13 LAB — CBC WITH DIFFERENTIAL/PLATELET
Basophils Absolute: 0 10*3/uL (ref 0.0–0.1)
Basophils Relative: 0 % (ref 0–1)
Eosinophils Absolute: 0.1 10*3/uL (ref 0.0–0.7)
Eosinophils Relative: 0 % (ref 0–5)
HCT: 35 % — ABNORMAL LOW (ref 39.0–52.0)
Hemoglobin: 10.9 g/dL — ABNORMAL LOW (ref 13.0–17.0)
Lymphocytes Relative: 12 % (ref 12–46)
Lymphs Abs: 1.8 10*3/uL (ref 0.7–4.0)
MCH: 28.2 pg (ref 26.0–34.0)
MCHC: 31.1 g/dL (ref 30.0–36.0)
MCV: 90.7 fL (ref 78.0–100.0)
Monocytes Absolute: 1.4 10*3/uL — ABNORMAL HIGH (ref 0.1–1.0)
Monocytes Relative: 9 % (ref 3–12)
Neutro Abs: 11.6 10*3/uL — ABNORMAL HIGH (ref 1.7–7.7)
Neutrophils Relative %: 78 % — ABNORMAL HIGH (ref 43–77)
Platelets: 185 10*3/uL (ref 150–400)
RBC: 3.86 MIL/uL — ABNORMAL LOW (ref 4.22–5.81)
RDW: 13.8 % (ref 11.5–15.5)
WBC: 14.8 10*3/uL — ABNORMAL HIGH (ref 4.0–10.5)

## 2014-04-13 LAB — BASIC METABOLIC PANEL
Anion gap: 9 (ref 5–15)
BUN: 11 mg/dL (ref 6–23)
CO2: 27 mEq/L (ref 19–32)
Calcium: 8.7 mg/dL (ref 8.4–10.5)
Chloride: 100 mEq/L (ref 96–112)
Creatinine, Ser: 0.83 mg/dL (ref 0.50–1.35)
GFR calc Af Amer: >90 mL/min (ref >90)
GFR calc non Af Amer: >90 mL/min (ref >90)
Glucose, Bld: 129 mg/dL — ABNORMAL HIGH (ref 70–99)
Potassium: 4.2 mEq/L (ref 3.7–5.3)
Sodium: 136 mEq/L — ABNORMAL LOW (ref 137–147)

## 2014-04-13 MED ORDER — SODIUM CHLORIDE 0.9 % IJ SOLN: 9.0000 mL | INTRAMUSCULAR | Status: DC | PRN

## 2014-04-13 MED ORDER — NALOXONE HCL 0.4 MG/ML IJ SOLN: 0.4000 mg | INTRAMUSCULAR | Status: DC | PRN

## 2014-04-13 MED ORDER — ONDANSETRON HCL 4 MG/2ML IJ SOLN: 4.0000 mg | Freq: Four times a day (QID) | INTRAMUSCULAR | Status: DC | PRN

## 2014-04-13 MED ORDER — DIPHENHYDRAMINE HCL 50 MG/ML IJ SOLN: 12.5000 mg | Freq: Four times a day (QID) | INTRAMUSCULAR | Status: DC | PRN

## 2014-04-13 MED ORDER — HYDROMORPHONE 0.3 MG/ML IV SOLN
INTRAVENOUS | Status: DC
  Administered 2014-04-13: 17:00:00 via INTRAVENOUS
  Administered 2014-04-13: 1.5 mg via INTRAVENOUS
  Administered 2014-04-14: 2.4 mg via INTRAVENOUS
  Administered 2014-04-14: 0.2 mg via INTRAVENOUS
  Administered 2014-04-14: 1.5 mg via INTRAVENOUS
  Administered 2014-04-14: 0.6 mg via INTRAVENOUS
  Administered 2014-04-14: 1.5 mg via INTRAVENOUS
  Administered 2014-04-14: 1.2 mg via INTRAVENOUS
  Administered 2014-04-14: 12:00:00 via INTRAVENOUS
  Administered 2014-04-15: 1.2 mg via INTRAVENOUS
  Administered 2014-04-15: 0.9 mg via INTRAVENOUS
  Administered 2014-04-15: 1.5 mg via INTRAVENOUS
  Administered 2014-04-15: 0.3 mg via INTRAVENOUS
  Administered 2014-04-15 (×2): 0.9 mg via INTRAVENOUS
  Administered 2014-04-15: 14:00:00 via INTRAVENOUS
  Administered 2014-04-15: 1.2 mg via INTRAVENOUS
  Administered 2014-04-16: 0.9 mg via INTRAVENOUS
  Administered 2014-04-16: 0.3 mg via INTRAVENOUS
  Filled 2014-04-13 (×3): qty 25

## 2014-04-13 MED ORDER — DIPHENHYDRAMINE HCL 12.5 MG/5ML PO ELIX: 12.5000 mg | ORAL_SOLUTION | Freq: Four times a day (QID) | ORAL | Status: DC | PRN

## 2014-04-13 NOTE — Progress Notes (Signed)
Rehab Admissions Coordinator Note:  Patient was screened by Trish MageLogue, Monea Pesantez M for appropriateness for an Inpatient Acute Rehab Consult.  At this time, we are recommending Inpatient Rehab consult.  Note [facility] and we would need to seek authorization if rehab is desired.  Lelon FrohlichLogue, Clois Montavon M 04/13/2014, 1:18 PM  I can be reached at [phone].
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
## 2014-04-14 ENCOUNTER — Inpatient Hospital Stay (HOSPITAL_COMMUNITY): Payer: Medicare HMO

## 2014-04-14 LAB — URINALYSIS, ROUTINE W REFLEX MICROSCOPIC
Bilirubin Urine: NEGATIVE
Glucose, UA: NEGATIVE mg/dL
Ketones, ur: 15 mg/dL — AB
Nitrite: NEGATIVE
Protein, ur: 30 mg/dL — AB
Specific Gravity, Urine: 1.022 (ref 1.005–1.030)
Urobilinogen, UA: 1 mg/dL (ref 0.0–1.0)
pH: 6.5 (ref 5.0–8.0)

## 2014-04-14 LAB — CBC
HCT: 35.5 % — ABNORMAL LOW (ref 39.0–52.0)
Hemoglobin: 11.5 g/dL — ABNORMAL LOW (ref 13.0–17.0)
MCH: 27.8 pg (ref 26.0–34.0)
MCHC: 32.4 g/dL (ref 30.0–36.0)
MCV: 85.7 fL (ref 78.0–100.0)
Platelets: 231 10*3/uL (ref 150–400)
RBC: 4.14 MIL/uL — ABNORMAL LOW (ref 4.22–5.81)
RDW: 13.4 % (ref 11.5–15.5)
WBC: 16.5 10*3/uL — ABNORMAL HIGH (ref 4.0–10.5)

## 2014-04-14 LAB — BASIC METABOLIC PANEL
Anion gap: 8 (ref 5–15)
BUN: 12 mg/dL (ref 6–23)
CO2: 27 mEq/L (ref 19–32)
Calcium: 8.4 mg/dL (ref 8.4–10.5)
Chloride: 101 mEq/L (ref 96–112)
Creatinine, Ser: 0.85 mg/dL (ref 0.50–1.35)
GFR calc Af Amer: >90 mL/min (ref >90)
GFR calc non Af Amer: 89 mL/min — ABNORMAL LOW (ref >90)
Glucose, Bld: 129 mg/dL — ABNORMAL HIGH (ref 70–99)
Potassium: 4.1 mEq/L (ref 3.7–5.3)
Sodium: 136 mEq/L — ABNORMAL LOW (ref 137–147)

## 2014-04-14 LAB — URINE MICROSCOPIC-ADD ON
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
## 2014-04-14 NOTE — Progress Notes (Signed)
I have seen and examined the pt and agree with PA-Jeffery's progress note. Mobilize Afebrile, but elev WBC-POD 4
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
## 2014-04-15 DIAGNOSIS — K567 Ileus, unspecified: Secondary | ICD-10-CM | POA: Diagnosis not present

## 2014-04-15 DIAGNOSIS — W3400XA Accidental discharge from unspecified firearms or gun, initial encounter: Secondary | ICD-10-CM

## 2014-04-15 DIAGNOSIS — S32501D Unspecified fracture of right pubis, subsequent encounter for fracture with routine healing: Secondary | ICD-10-CM

## 2014-04-15 DIAGNOSIS — K913 Postprocedural intestinal obstruction: Secondary | ICD-10-CM

## 2014-04-15 DIAGNOSIS — I10 Essential (primary) hypertension: Secondary | ICD-10-CM | POA: Insufficient documentation

## 2014-04-15 DIAGNOSIS — T148 Other injury of unspecified body region: Secondary | ICD-10-CM

## 2014-04-15 DIAGNOSIS — K9189 Other postprocedural complications and disorders of digestive system: Secondary | ICD-10-CM

## 2014-04-15 DIAGNOSIS — S32509A Unspecified fracture of unspecified pubis, initial encounter for closed fracture: Secondary | ICD-10-CM | POA: Diagnosis present

## 2014-04-15 DIAGNOSIS — R5381 Other malaise: Secondary | ICD-10-CM

## 2014-04-15 DIAGNOSIS — D62 Acute posthemorrhagic anemia: Secondary | ICD-10-CM | POA: Diagnosis not present

## 2014-04-15 LAB — URINE CULTURE
Colony Count: NO GROWTH
Culture: NO GROWTH

## 2014-04-15 LAB — CBC
HCT: 34.6 % — ABNORMAL LOW (ref 39.0–52.0)
Hemoglobin: 11.3 g/dL — ABNORMAL LOW (ref 13.0–17.0)
MCH: 28.1 pg (ref 26.0–34.0)
MCHC: 32.7 g/dL (ref 30.0–36.0)
MCV: 86.1 fL (ref 78.0–100.0)
Platelets: 244 10*3/uL (ref 150–400)
RBC: 4.02 MIL/uL — ABNORMAL LOW (ref 4.22–5.81)
RDW: 13.3 % (ref 11.5–15.5)
WBC: 13.8 10*3/uL — ABNORMAL HIGH (ref 4.0–10.5)

## 2014-04-15 MED ORDER — WHITE PETROLATUM GEL
Status: AC
  Filled 2014-04-15: qty 5

## 2014-04-15 MED ORDER — ENOXAPARIN SODIUM 60 MG/0.6ML ~~LOC~~ SOLN
50.0000 mg | Freq: Two times a day (BID) | SUBCUTANEOUS | Status: DC
  Administered 2014-04-15 – 2014-04-21 (×13): 50 mg via SUBCUTANEOUS
  Filled 2014-04-15 (×16): qty 0.6
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
## 2014-04-15 NOTE — Progress Notes (Signed)
UR completed. Await return of bowel function. Therapies recommending CIR at d/c.   Carlyle LipaMichelle Circe Chilton, RN BSN MHA CCM Trauma/Neuro ICU Case Manager [phone]
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
## 2014-04-15 NOTE — Progress Notes (Signed)
Patient has really good bowel sounds.  Return of function still awaiting.  This patient has been seen and I agree with the findings and treatment plan.  Marta LamasJames O. Gae BonWyatt, III, MD, FACS [phone] (pager) [phone] (direct pager) Trauma Surgeon

## 2014-04-16 LAB — CBC
HCT: 35.1 % — ABNORMAL LOW (ref 39.0–52.0)
Hemoglobin: 11.4 g/dL — ABNORMAL LOW (ref 13.0–17.0)
MCH: 27.7 pg (ref 26.0–34.0)
MCHC: 32.5 g/dL (ref 30.0–36.0)
MCV: 85.4 fL (ref 78.0–100.0)
Platelets: 268 10*3/uL (ref 150–400)
RBC: 4.11 MIL/uL — ABNORMAL LOW (ref 4.22–5.81)
RDW: 13.3 % (ref 11.5–15.5)
WBC: 13.1 10*3/uL — ABNORMAL HIGH (ref 4.0–10.5)

## 2014-04-16 MED ORDER — HYDROCHLOROTHIAZIDE 25 MG PO TABS
25.0000 mg | ORAL_TABLET | Freq: Every day | ORAL | Status: DC
  Administered 2014-04-16 – 2014-04-21 (×6): 25 mg via ORAL
  Filled 2014-04-16 (×7): qty 1

## 2014-04-16 MED ORDER — TRAMADOL HCL 50 MG PO TABS
100.0000 mg | ORAL_TABLET | Freq: Four times a day (QID) | ORAL | Status: DC
  Administered 2014-04-16 – 2014-04-21 (×21): 100 mg via ORAL
  Filled 2014-04-16 (×21): qty 2

## 2014-04-16 MED ORDER — AMLODIPINE BESYLATE 5 MG PO TABS
5.0000 mg | ORAL_TABLET | Freq: Every day | ORAL | Status: DC
  Administered 2014-04-16 – 2014-04-21 (×6): 5 mg via ORAL
  Filled 2014-04-16 (×7): qty 1

## 2014-04-16 MED ORDER — DOCUSATE SODIUM 100 MG PO CAPS
100.0000 mg | ORAL_CAPSULE | Freq: Two times a day (BID) | ORAL | Status: DC
  Administered 2014-04-16 – 2014-04-21 (×11): 100 mg via ORAL
  Filled 2014-04-16 (×11): qty 1

## 2014-04-16 MED ORDER — SULINDAC 150 MG PO TABS
150.0000 mg | ORAL_TABLET | Freq: Two times a day (BID) | ORAL | Status: DC
  Administered 2014-04-16 – 2014-04-21 (×11): 150 mg via ORAL
  Filled 2014-04-16 (×13): qty 1

## 2014-04-16 MED ORDER — LISINOPRIL 20 MG PO TABS
20.0000 mg | ORAL_TABLET | Freq: Every day | ORAL | Status: DC
  Administered 2014-04-16 – 2014-04-21 (×6): 20 mg via ORAL
  Filled 2014-04-16 (×7): qty 1

## 2014-04-16 MED ORDER — POLYETHYLENE GLYCOL 3350 17 G PO PACK
17.0000 g | PACK | Freq: Every day | ORAL | Status: DC
  Administered 2014-04-16 – 2014-04-21 (×6): 17 g via ORAL
  Filled 2014-04-16 (×7): qty 1

## 2014-04-16 MED ORDER — HYDROMORPHONE HCL 1 MG/ML IJ SOLN: 0.5000 mg | INTRAMUSCULAR | Status: DC | PRN

## 2014-04-16 MED ORDER — OXYCODONE HCL 5 MG PO TABS
5.0000 mg | ORAL_TABLET | ORAL | Status: DC | PRN
  Administered 2014-04-16: 10 mg via ORAL
  Administered 2014-04-17: 15 mg via ORAL
  Filled 2014-04-16: qty 3
  Filled 2014-04-16: qty 2

## 2014-04-16 MED ORDER — LISINOPRIL-HYDROCHLOROTHIAZIDE 20-25 MG PO TABS: 1.0000 | ORAL_TABLET | Freq: Every day | ORAL | Status: DC

## 2014-04-16 NOTE — Progress Notes (Signed)
CPM.  This patient has been seen and I agree with the findings and treatment plan.  Marta LamasJames O. Gae BonWyatt, III, MD, FACS [phone] (pager) [phone] (direct pager) Trauma Surgeon
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
## 2014-04-17 LAB — CREATININE, SERUM
Creatinine, Ser: 0.91 mg/dL (ref 0.50–1.35)
GFR calc Af Amer: >90 mL/min (ref >90)
GFR calc non Af Amer: 87 mL/min — ABNORMAL LOW (ref >90)
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
## 2014-04-18 LAB — CBC
HCT: 35.3 % — ABNORMAL LOW (ref 39.0–52.0)
Hemoglobin: 11.6 g/dL — ABNORMAL LOW (ref 13.0–17.0)
MCH: 27.7 pg (ref 26.0–34.0)
MCHC: 32.9 g/dL (ref 30.0–36.0)
MCV: 84.2 fL (ref 78.0–100.0)
Platelets: 289 10*3/uL (ref 150–400)
RBC: 4.19 MIL/uL — ABNORMAL LOW (ref 4.22–5.81)
RDW: 12.9 % (ref 11.5–15.5)
WBC: 10.9 10*3/uL — ABNORMAL HIGH (ref 4.0–10.5)

## 2014-04-18 NOTE — Progress Notes (Signed)
8 Days Post-Op  Subjective: Feels well, no complaints, having bowel function  Objective: Vital signs in last 24 hours: Temp:  [97.9 F (36.6 C)-99.3 F (37.4 C)] 98.1 F (36.7 C) (10/25 0538) Pulse Rate:  [80-90] 81 (10/25 0538) Resp:  [20] 20 (10/25 0538) BP: (121-131)/(52-74) 131/57 mmHg (10/25 0538) SpO2:  [94 %-97 %] 95 % (10/25 0538) Last BM Date: 04/10/14  Intake/Output from previous day: 10/24 0701 - 10/25 0700 In: 580 [P.O.:580] Out: 2230 [Urine:2230] Intake/Output this shift:    General appearance: no distress Resp: diminished breath sounds bibasilar Cardio: regular rate and rhythm GI: wound clean, difficult to assess base though, bs present soft  Lab Results:   Recent Labs  04/16/14 0352 04/18/14 0330  WBC 13.1* 10.9*  HGB 11.4* 11.6*  HCT 35.1* 35.3*  PLT 268 289   BMET  Recent Labs  04/17/14 0326  CREATININE 0.91   PT/INR No results found for this basename: LABPROT, INR,  in the last 72 hours ABG No results found for this basename: PHART, PCO2, PO2, HCO3,  in the last 72 hours  Studies/Results: No results found.  Anti-infectives: Anti-infectives   Start     Dose/Rate Route Frequency Ordered Stop   04/10/14 1600  piperacillin-tazobactam (ZOSYN) IVPB 3.375 g     3.375 g 12.5 mL/hr over 240 Minutes Intravenous Every 8 hours 04/10/14 1233 04/11/14 1140   04/10/14 1400  piperacillin-tazobactam (ZOSYN) IVPB 3.375 g  Status:  Discontinued     3.375 g 12.5 mL/hr over 240 Minutes Intravenous 3 times per day 04/10/14 1225 04/10/14 1232   04/10/14 0830  [MAR Hold]  piperacillin-tazobactam (ZOSYN) IVPB 3.375 g     (On MAR Hold since 04/10/14 0841)   3.375 g 12.5 mL/hr over 240 Minutes Intravenous  Once 04/10/14 0824 04/10/14 0910      Assessment/Plan: POD 8 elap sbr  1.Neuro- oral pain meds with iv backup  2. Cv/pulm cont home meds  3. GI- advance diet  4. Abl anemia-stable, wbc better near normal without evidence infection 5. lovenox scds   6. Ready for rehab 7. wbat for symphysis fx   [facility]avhcs [facility] CampusWAKEFIELD,Marielis Samara 04/18/2014
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
## 2014-04-19 NOTE — Progress Notes (Signed)
Rehab admissions - Evaluated for possible admission.  I met with patient this am.  I faxed information to [facility] for review.  Noted patient now has walked min guard assist 175 ft.  In trauma rounds, plan is to discharge home with [facility] in am.  I have now withdrawn my request for inpatient rehab admission expecting for patient to discharge home with [facility] in am.  Call me for questions.  #[phone]
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
## 2014-04-19 NOTE — Progress Notes (Signed)
Up in chair. A lot of flatus. Sppreciate CIR eval. Patient examined and I agree with the assessment and plan  Violeta GelinasBurke Chiyeko Ferre, MD, MPH, FACS Trauma: [phone] General Surgery: [phone]  04/19/2014 11:27 AM

## 2014-04-20 MED ORDER — FLEET ENEMA 7-19 GM/118ML RE ENEM
1.0000 | ENEMA | Freq: Once | RECTAL | Status: AC
  Administered 2014-04-20: 1 via RECTAL
  Filled 2014-04-20: qty 1

## 2014-04-20 MED ORDER — POLYETHYLENE GLYCOL 3350 17 G PO PACK: 17.0000 g | PACK | Freq: Every day | ORAL | Status: DC | PRN

## 2014-04-20 MED ORDER — ENOXAPARIN SODIUM 60 MG/0.6ML ~~LOC~~ SOLN: 50.0000 mg | Freq: Two times a day (BID) | SUBCUTANEOUS | Status: DC

## 2014-04-20 MED ORDER — OXYCODONE HCL 5 MG PO TABS: 5.0000 mg | ORAL_TABLET | Freq: Four times a day (QID) | ORAL | Status: DC | PRN

## 2014-04-20 MED ORDER — DSS 100 MG PO CAPS: 100.0000 mg | ORAL_CAPSULE | Freq: Two times a day (BID) | ORAL | Status: DC

## 2014-04-20 MED ORDER — ENOXAPARIN (LOVENOX) PATIENT EDUCATION KIT
PACK | Freq: Once | Status: AC
  Administered 2014-04-20: 09:00:00
  Filled 2014-04-20: qty 1

## 2014-04-20 NOTE — Progress Notes (Signed)
Called approximately 60 minutes ago to confirm receipt of the faxed referral for DME (I had a fax confirmation).  They had received it but were planning a 5-day processing time. Advised them that this was a hospital discharge and the patient was ready to discharge today.  [facility] rep was able to get this expedited and said that the DME would deliver today but she could not say when other than within next 4 hours.  Anticipate pt should receive DME by 2000pm.   Carlyle LipaMichelle Denese Mentink, RN BSN MHA CCM  Case Manager, Trauma Service/Unit 31M [phone]
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
## 2014-04-20 NOTE — Progress Notes (Signed)
Patient received a call from [employer] stating that they did not have his equipment in stock (that they'd already said would be delivered today).  [name] SpanielSaid it would be delivered tomorrow.  Patient needs his walker to ambulate.  Notified Dr Corliss Skainssuei and order received to cancel discharge.  Also notified Carlyle LipaMichelle Bryson, CM of the situation.  Will cont to monitor.

## 2014-04-20 NOTE — Discharge Instructions (Signed)
[facility] [phone]  OPEN ABDOMINAL SURGERY: POST OP INSTRUCTIONS  Always review your discharge instruction sheet given to you by the facility where your surgery was performed.  IF YOU HAVE DISABILITY OR FAMILY LEAVE FORMS, YOU MUST BRING THEM TO THE OFFICE FOR PROCESSING.  PLEASE DO NOT GIVE THEM TO YOUR DOCTOR.  1. A prescription for pain medication may be given to you upon discharge.  Take your pain medication as prescribed, if needed.  If narcotic pain medicine is not needed, then you may take acetaminophen (Tylenol) or ibuprofen (Advil) as needed. 2. Take your usually prescribed medications unless otherwise directed. 3. If you need a refill on your pain medication, please contact your pharmacy. They will contact our office to request authorization.  Prescriptions will not be filled after 5pm or on week-ends. 4. You should follow a light diet the first few days after arrival home, such as soup and crackers, pudding, etc.unless your doctor has advised otherwise. A high-fiber, low fat diet can be resumed as tolerated.   Be sure to include lots of fluids daily. Most patients will experience some swelling and bruising on the chest and neck area.  Ice packs will help.  Swelling and bruising can take several days to resolve 5. Most patients will experience some swelling and bruising in the area of the incision. Ice pack will help. Swelling and bruising can take several days to resolve..  6. It is common to experience some constipation if taking pain medication after surgery.  Increasing fluid intake and taking a stool softener will usually help or prevent this problem from occurring.  A mild laxative (Milk of Magnesia or Miralax) should be taken according to package directions if there are no bowel movements after 48 hours. 7.  You may have steri-strips (small skin tapes) in place directly over the incision.  These strips should be left on the skin for 7-10 days.  If your  surgeon used skin glue on the incision, you may shower in 24 hours.  The glue will flake off over the next 2-3 weeks.  Any sutures or staples will be removed at the office during your follow-up visit. You may find that a light gauze bandage over your incision may keep your staples from being rubbed or pulled. You may shower and replace the bandage daily. 8. ACTIVITIES:  You may resume regular (light) daily activities beginning the next day--such as daily self-care, walking, climbing stairs--gradually increasing activities as tolerated.  You may have sexual intercourse when it is comfortable.  Refrain from any heavy lifting or straining until approved by your doctor. a. You may drive when you no longer are taking prescription pain medication, you can comfortably wear a seatbelt, and you can safely maneuver your car and apply brakes b. Return to Work: ___________________________________ 9. You should see your doctor in the office for a follow-up appointment approximately two weeks after your surgery.  Make sure that you call for this appointment within a day or two after you arrive home to insure a convenient appointment time. OTHER INSTRUCTIONS:  _____________________________________________________________ _____________________________________________________________  WHEN TO CALL YOUR DOCTOR: 1. Fever over 101.0 2. Inability to urinate 3. Nausea and/or vomiting 4. Extreme swelling or bruising 5. Continued bleeding from incision. 6. Increased pain, redness, or drainage from the incision. 7. Difficulty swallowing or breathing 8. Muscle cramping or spasms. 9. Numbness or tingling in hands or feet or around lips.  The clinic staff is available to   answer your questions during regular business hours.  Please don't hesitate to call and ask to speak to one of the nurses if you have concerns.  For further questions, please visit [email or web address]   

## 2014-04-20 NOTE — Discharge Summary (Signed)
Ryna Beckstrom, MD, MPH, FACS Trauma: [phone] General Surgery: [phone]  

## 2014-04-20 NOTE — Progress Notes (Signed)
Agree with PTA.    Amen Dargis, PT [phone]  
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
## 2014-04-20 NOTE — Progress Notes (Signed)
Pt to d/c home today with HHRN/PT/OT from [facility] per pt choice and insurance network options. Rolling walker and BSC ordered through [employer] (fax sent at 1239pm) per insurance network options. Await notification of delivery time.   Medicare IM (Important Message) delivered to patient today by me in anticipation of discharge.    Carlyle LipaMichelle Sergi Gellner, RN BSN MHA CCM  Case Manager, Trauma Service/Unit 41M [phone]

## 2014-04-21 MED ORDER — FLEET ENEMA 7-19 GM/118ML RE ENEM
1.0000 | ENEMA | Freq: Once | RECTAL | Status: AC
  Administered 2014-04-21: 1 via RECTAL
  Filled 2014-04-21: qty 1

## 2014-04-21 NOTE — Discharge Summary (Signed)
Fabianna Keats, MD, MPH, FACS Trauma: [phone] General Surgery: [phone]  
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
## 2014-04-23 ENCOUNTER — Telehealth: Payer: Self-pay

## 2014-04-23 NOTE — Telephone Encounter (Signed)
This is okay.

## 2014-04-23 NOTE — Telephone Encounter (Signed)
error 

## 2014-04-23 NOTE — Telephone Encounter (Signed)
Left message that p/t 2 x aweek for 2 weeks okay

## 2014-04-23 NOTE — Telephone Encounter (Signed)
Aaron PavlovJim Hofman is asking for p/t 2 x a week for 2 weeks is this okay

## 2014-04-29 ENCOUNTER — Telehealth (INDEPENDENT_AMBULATORY_CARE_PROVIDER_SITE_OTHER): Payer: Self-pay

## 2014-04-29 NOTE — Telephone Encounter (Signed)
Notified HHRN that at her next visit she can go ahead and remove the suture, she verbalized understanding.  Thank you

## 2014-04-29 NOTE — Telephone Encounter (Signed)
Home health nurse for Aaron Castro (MRN [id number]) called to inform us that a stitch where the jp drain was removed is still in place, was not sure if she should remove it or leave in place, she also wanted to inform you that in the center of his wound site she is able to insert the q-tip approx. 4cm. The wound still has a yellow pink drainage. I advised the Home Health nurse to leave the suture in place for now and advised her that I would send the fyi note to you about the wound. The nurse verbally acknowledged understanding. The patient also has a scheduled f/u in the clinic next week with you on 11/11 at 2:30pm. Please advise if you have any orders.  Thank you,  Barbara CowerJason

## 2014-04-29 NOTE — Telephone Encounter (Signed)
Please let HHRN know they can remove suture at next visit. Thx

## 2014-05-04 ENCOUNTER — Encounter: Payer: Self-pay | Admitting: Family Medicine

## 2014-05-04 ENCOUNTER — Telehealth: Payer: Self-pay | Admitting: Internal Medicine

## 2014-05-04 ENCOUNTER — Ambulatory Visit (INDEPENDENT_AMBULATORY_CARE_PROVIDER_SITE_OTHER): Payer: Commercial Managed Care - HMO | Admitting: Family Medicine

## 2014-05-04 VITALS — BP 122/64 | HR 94 | Wt 356.0 lb

## 2014-05-04 DIAGNOSIS — W3400XS Accidental discharge from unspecified firearms or gun, sequela: Secondary | ICD-10-CM

## 2014-05-04 DIAGNOSIS — S32509D Unspecified fracture of unspecified pubis, subsequent encounter for fracture with routine healing: Secondary | ICD-10-CM

## 2014-05-04 NOTE — Telephone Encounter (Signed)
Pt was in today

## 2014-05-04 NOTE — Telephone Encounter (Signed)
Jim with [facility] called stating that pt has been doing great with progress but is having some SOB and he is not taking anything for it. So when he comes in today he just wanted you to be aware of the SOB incase you wanted to discuss this with him. If you need to speak with him you can call JIM @ [phone]

## 2014-05-04 NOTE — Telephone Encounter (Signed)
Probably time for him to come in for a visit
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
## 2014-05-04 NOTE — Patient Instructions (Signed)
Keep yourself physically active however when you sit you need to keep your feet elevated. Call me if you have any problems

## 2014-05-06 ENCOUNTER — Other Ambulatory Visit: Payer: Self-pay | Admitting: Family Medicine

## 2014-05-06 NOTE — Telephone Encounter (Signed)
Is this okay?

## 2014-05-06 NOTE — Telephone Encounter (Signed)
Okay to renew with 5 refills 

## 2014-05-13 ENCOUNTER — Inpatient Hospital Stay: Payer: Commercial Managed Care - HMO | Admitting: Family Medicine

## 2014-05-17 ENCOUNTER — Telehealth (INDEPENDENT_AMBULATORY_CARE_PROVIDER_SITE_OTHER): Payer: Self-pay
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
## 2014-05-17 NOTE — Telephone Encounter (Signed)
OK. Thanks. Continue current plan.

## 2014-05-24 ENCOUNTER — Telehealth (HOSPITAL_COMMUNITY): Payer: Self-pay

## 2014-05-24 NOTE — Telephone Encounter (Signed)
Advised her to pack the small wound with either iodoform gauze packing strip or a unfolded 4x4 and monitor the wound closely.  It's not draining significantly.  Nurse noted it was serosanguinous drainage so may be a unresolved seroma now.  Advised the nurse to call back on her next visit to give us an update.  If he develops any signs of infection would recommended going to the ER.

## 2014-05-26 ENCOUNTER — Encounter (HOSPITAL_COMMUNITY): Payer: Self-pay | Admitting: *Deleted

## 2014-05-26 ENCOUNTER — Emergency Department (HOSPITAL_COMMUNITY)
Admission: EM | Admit: 2014-05-26 | Discharge: 2014-05-26 | Disposition: A | Discharge status: Home / Self Care | Payer: No Typology Code available for payment source | Attending: Emergency Medicine | Admitting: Emergency Medicine

## 2014-05-26 DIAGNOSIS — T8132XA Disruption of internal operation (surgical) wound, not elsewhere classified, initial encounter: Secondary | ICD-10-CM | POA: Diagnosis not present

## 2014-05-26 DIAGNOSIS — Z791 Long term (current) use of non-steroidal anti-inflammatories (NSAID): Secondary | ICD-10-CM | POA: Diagnosis not present

## 2014-05-26 DIAGNOSIS — I10 Essential (primary) hypertension: Secondary | ICD-10-CM | POA: Insufficient documentation

## 2014-05-26 DIAGNOSIS — Z8719 Personal history of other diseases of the digestive system: Secondary | ICD-10-CM | POA: Insufficient documentation

## 2014-05-26 DIAGNOSIS — M1990 Unspecified osteoarthritis, unspecified site: Secondary | ICD-10-CM | POA: Insufficient documentation

## 2014-05-26 DIAGNOSIS — Z9889 Other specified postprocedural states: Secondary | ICD-10-CM | POA: Diagnosis not present

## 2014-05-26 DIAGNOSIS — Z79899 Other long term (current) drug therapy: Secondary | ICD-10-CM | POA: Diagnosis not present

## 2014-05-26 DIAGNOSIS — Y838 Other surgical procedures as the cause of abnormal reaction of the patient, or of later complication, without mention of misadventure at the time of the procedure: Secondary | ICD-10-CM | POA: Insufficient documentation

## 2014-05-26 DIAGNOSIS — Z4801 Encounter for change or removal of surgical wound dressing: Secondary | ICD-10-CM | POA: Diagnosis present

## 2014-05-26 DIAGNOSIS — T8131XA Disruption of external operation (surgical) wound, not elsewhere classified, initial encounter: Secondary | ICD-10-CM

## 2014-05-26 LAB — COMPREHENSIVE METABOLIC PANEL
ALT: 18 U/L (ref 0–53)
AST: 22 U/L (ref 0–37)
Albumin: 2.6 g/dL — ABNORMAL LOW (ref 3.5–5.2)
Alkaline Phosphatase: 45 U/L (ref 39–117)
Anion gap: 13 (ref 5–15)
BUN: 14 mg/dL (ref 6–23)
CO2: 27 mEq/L (ref 19–32)
Calcium: 9.5 mg/dL (ref 8.4–10.5)
Chloride: 97 mEq/L (ref 96–112)
Creatinine, Ser: 0.73 mg/dL (ref 0.50–1.35)
GFR calc Af Amer: >90 mL/min (ref >90)
GFR calc non Af Amer: >90 mL/min (ref >90)
Glucose, Bld: 93 mg/dL (ref 70–99)
Potassium: 4.1 mEq/L (ref 3.7–5.3)
Sodium: 137 mEq/L (ref 137–147)
Total Bilirubin: 0.3 mg/dL (ref 0.3–1.2)
Total Protein: 8.2 g/dL (ref 6.0–8.3)

## 2014-05-26 LAB — CBC WITH DIFFERENTIAL/PLATELET
Basophils Absolute: 0 10*3/uL (ref 0.0–0.1)
Basophils Relative: 0 % (ref 0–1)
Eosinophils Absolute: 0.2 10*3/uL (ref 0.0–0.7)
Eosinophils Relative: 2 % (ref 0–5)
HCT: 33.9 % — ABNORMAL LOW (ref 39.0–52.0)
Hemoglobin: 11.1 g/dL — ABNORMAL LOW (ref 13.0–17.0)
Lymphocytes Relative: 27 % (ref 12–46)
Lymphs Abs: 2.7 10*3/uL (ref 0.7–4.0)
MCH: 27 pg (ref 26.0–34.0)
MCHC: 32.7 g/dL (ref 30.0–36.0)
MCV: 82.5 fL (ref 78.0–100.0)
Monocytes Absolute: 1.1 10*3/uL — ABNORMAL HIGH (ref 0.1–1.0)
Monocytes Relative: 11 % (ref 3–12)
Neutro Abs: 6.1 10*3/uL (ref 1.7–7.7)
Neutrophils Relative %: 60 % (ref 43–77)
Platelets: 452 10*3/uL — ABNORMAL HIGH (ref 150–400)
RBC: 4.11 MIL/uL — ABNORMAL LOW (ref 4.22–5.81)
RDW: 13.4 % (ref 11.5–15.5)
WBC: 10.1 10*3/uL (ref 4.0–10.5)

## 2014-05-26 MED ORDER — DOXYCYCLINE HYCLATE 100 MG PO TABS: 100.0000 mg | ORAL_TABLET | Freq: Two times a day (BID) | ORAL | Status: DC

## 2014-05-26 MED ORDER — DOXYCYCLINE HYCLATE 100 MG PO TABS
100.0000 mg | ORAL_TABLET | Freq: Once | ORAL | Status: AC
  Administered 2014-05-26: 100 mg via ORAL
  Filled 2014-05-26: qty 1
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
## 2014-05-26 NOTE — Discharge Instructions (Signed)
Wound Dehiscence °Wound dehiscence is when a surgical cut (incision) breaks open and does not heal properly after surgery. It usually happens 7-10 days after surgery. This can be a serious condition. It is important to identify and treat this condition early.  °CAUSES  °Some common causes of wound dehiscence include: °· Stretching of the wound area. This may be caused by lifting, vomiting, violent coughing, or straining during bowel movements. °· Wound infection. °· Early stitch (suture) removal. °RISK FACTORS °Various things can increase your risk of developing wound dehiscence, including: °· Obesity. °· Lung disease. °· Smoking. °· Poor nutrition. °· Contamination during surgery. °SIGNS AND SYMPTOMS °· Bleeding from the wound. °· Pain. °· Fever. °· Wound starts breaking open. °DIAGNOSIS  °· Your health care provider may diagnose wound dehiscence by monitoring the incision and noting any changes in the wound. These changes can include an increase in drainage or pain. The health care provider may also ask you if you have noticed any stretching or tearing of the wound. °· Wound cultures may be taken to determine if there is an infection.  °· Imaging studies, such as an MRI scan or CT scan, may be done to determine if there is a collection of pus or fluid in the wound area. °TREATMENT °Treatment may include: °· Wound care. °· Surgical repair. °· Antibiotic medicine to treat or prevent infection. °· Medicines to reduce pain and swelling. °HOME CARE INSTRUCTIONS  °· Only take over-the-counter or prescription medicines for pain, discomfort, or fever as directed by your health care provider. Taking pain medicine 30 minutes before changing a bandage (dressing) can help relieve pain. °· Take your antibiotics as directed. Finish them even if you start to feel better. °· Gently wash the area with mild soap and water 2 times a day, or as directed. Rinse off the soap. Pat the area dry with a clean towel. Do not rub the wound.  This may cause bleeding. °· Follow your health care provider's instructions for how often you need to change the dressing and packing inside. Wash your hands well before and after changing your dressing. Apply a dressing to the wound as directed. °· Take showers. Do not soak the wound, bathe, swim, or use a hot tub until directed by your health care provider. °· Avoid exercises that make you sweat heavily. °· Use anti-itch medicine as directed by your health care provider. The wound may itch when it is healing. Do not pick or scratch at the wound. °· Do not lift more than 10 pounds (4.5 kg) until the wound is healed, or as directed by your health care provider. °· Keep all follow-up appointments as directed. °SEEK MEDICAL CARE IF: °· You have excessive bleeding from your surgical wound. °· Your wound does not seem to be healing properly. °· You have a fever. °SEEK IMMEDIATE MEDICAL CARE IF:  °· You have increased swelling or redness around the wound. °· You have increasing pain in the wound. °· You have an increasing amount of pus coming from the wound. °· Your wound breaks open farther. °MAKE SURE YOU:  °· Understand these instructions. °· Will watch your condition. °· Will get help right away if you are not doing well or get worse. °Document Released: 09/01/2003 Document Revised: 06/16/2013 Document Reviewed: 02/16/2013 °ExitCare® Patient Information ©2015 [employer]. This information is not intended to replace advice given to you by your health care provider. Make sure you discuss any questions you have with your health care provider. ° °

## 2014-05-26 NOTE — ED Notes (Signed)
Verified new dressing has been applied.
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
## 2014-05-28 NOTE — Telephone Encounter (Signed)
[facility] needed to be seen due to wound changes and then spoke with patient. I offered appt for later that day but he had another doctor's appt that conflicted. I advised him to [facility] following that appointment so wound could be assessed there.

## 2014-05-29 LAB — WOUND CULTURE

## 2014-06-01 ENCOUNTER — Telehealth (HOSPITAL_COMMUNITY): Payer: Self-pay

## 2014-06-01 ENCOUNTER — Telehealth: Payer: Self-pay | Admitting: Internal Medicine
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
## 2014-06-01 NOTE — Telephone Encounter (Signed)
Post ED Visit - Positive Culture Follow-up  Culture report reviewed by antimicrobial stewardship pharmacist: []  Wes Dulaney, Pharm.D., BCPS []  Celedonio MiyamotoJeremy Frens, [address]Ph[address] BCPS [x]  Georgina PillionElizabeth Martin, [address]Ph[address] BCPS []  Clay SpringsMinh Pham, [place]arm[place]D., BCPS, AAHIVP []  Estella HuskMichelle Turner, Pharm.D., BCPS, AAHIVP []  Babs BertinHaley Baird, [address]Pharm[address]   Positive Wound culture, Staph aureus Treated with doxycline, organism sensitive to the same and no further patient follow-up is required at this time.  Arvid RightClark, Donell Sliwinski Dorn 06/01/2014, 6:01 AM

## 2014-06-03 ENCOUNTER — Other Ambulatory Visit: Payer: Self-pay | Admitting: Family Medicine

## 2014-06-20 ENCOUNTER — Emergency Department (HOSPITAL_COMMUNITY)
Admission: EM | Admit: 2014-06-20 | Discharge: 2014-06-20 | Disposition: A | Discharge status: Home / Self Care | Payer: Medicare HMO | Attending: Emergency Medicine | Admitting: Emergency Medicine

## 2014-06-20 ENCOUNTER — Encounter (HOSPITAL_COMMUNITY): Payer: Self-pay | Admitting: Emergency Medicine

## 2014-06-20 DIAGNOSIS — E669 Obesity, unspecified: Secondary | ICD-10-CM | POA: Insufficient documentation

## 2014-06-20 DIAGNOSIS — Z79899 Other long term (current) drug therapy: Secondary | ICD-10-CM | POA: Insufficient documentation

## 2014-06-20 DIAGNOSIS — Z8719 Personal history of other diseases of the digestive system: Secondary | ICD-10-CM | POA: Diagnosis not present

## 2014-06-20 DIAGNOSIS — Y838 Other surgical procedures as the cause of abnormal reaction of the patient, or of later complication, without mention of misadventure at the time of the procedure: Secondary | ICD-10-CM | POA: Diagnosis not present

## 2014-06-20 DIAGNOSIS — I10 Essential (primary) hypertension: Secondary | ICD-10-CM | POA: Diagnosis not present

## 2014-06-20 DIAGNOSIS — M1990 Unspecified osteoarthritis, unspecified site: Secondary | ICD-10-CM | POA: Diagnosis not present

## 2014-06-20 DIAGNOSIS — Z7901 Long term (current) use of anticoagulants: Secondary | ICD-10-CM | POA: Diagnosis not present

## 2014-06-20 DIAGNOSIS — L0390 Cellulitis, unspecified: Secondary | ICD-10-CM

## 2014-06-20 DIAGNOSIS — T148XXA Other injury of unspecified body region, initial encounter: Secondary | ICD-10-CM

## 2014-06-20 DIAGNOSIS — Z791 Long term (current) use of non-steroidal anti-inflammatories (NSAID): Secondary | ICD-10-CM | POA: Insufficient documentation

## 2014-06-20 DIAGNOSIS — L24A9 Irritant contact dermatitis due friction or contact with other specified body fluids: Secondary | ICD-10-CM

## 2014-06-20 DIAGNOSIS — T814XXA Infection following a procedure, initial encounter: Secondary | ICD-10-CM | POA: Diagnosis present

## 2014-06-20 DIAGNOSIS — Z792 Long term (current) use of antibiotics: Secondary | ICD-10-CM | POA: Diagnosis not present

## 2014-06-20 MED ORDER — CLINDAMYCIN HCL 150 MG PO CAPS: 450.0000 mg | ORAL_CAPSULE | Freq: Three times a day (TID) | ORAL | Status: DC
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
## 2014-06-20 NOTE — Discharge Instructions (Signed)
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
## 2014-07-04 ENCOUNTER — Other Ambulatory Visit: Payer: Self-pay | Admitting: Family Medicine

## 2014-07-05 NOTE — Telephone Encounter (Signed)
Is this okay?

## 2014-07-06 ENCOUNTER — Other Ambulatory Visit: Payer: Self-pay | Admitting: Family Medicine

## 2014-08-17 ENCOUNTER — Encounter: Payer: Self-pay | Admitting: Family Medicine

## 2014-08-17 ENCOUNTER — Ambulatory Visit (INDEPENDENT_AMBULATORY_CARE_PROVIDER_SITE_OTHER): Payer: Commercial Managed Care - HMO | Admitting: Family Medicine

## 2014-08-17 VITALS — BP 140/80 | HR 88 | Wt 368.0 lb

## 2014-08-17 DIAGNOSIS — M1990 Unspecified osteoarthritis, unspecified site: Secondary | ICD-10-CM

## 2014-08-17 DIAGNOSIS — G8929 Other chronic pain: Secondary | ICD-10-CM

## 2014-08-17 LAB — CBC WITH DIFFERENTIAL/PLATELET
Basophils Absolute: 0 10*3/uL (ref 0.0–0.1)
Basophils Relative: 0 % (ref 0–1)
Eosinophils Absolute: 0.2 10*3/uL (ref 0.0–0.7)
Eosinophils Relative: 2 % (ref 0–5)
HCT: 43.4 % (ref 39.0–52.0)
Hemoglobin: 14.1 g/dL (ref 13.0–17.0)
Lymphocytes Relative: 36 % (ref 12–46)
Lymphs Abs: 4 10*3/uL (ref 0.7–4.0)
MCH: 26.9 pg (ref 26.0–34.0)
MCHC: 32.5 g/dL (ref 30.0–36.0)
MCV: 82.8 fL (ref 78.0–100.0)
MPV: 8.7 fL (ref 8.6–12.4)
Monocytes Absolute: 1 10*3/uL (ref 0.1–1.0)
Monocytes Relative: 9 % (ref 3–12)
Neutro Abs: 5.9 10*3/uL (ref 1.7–7.7)
Neutrophils Relative %: 53 % (ref 43–77)
Platelets: 287 10*3/uL (ref 150–400)
RBC: 5.24 MIL/uL (ref 4.22–5.81)
RDW: 15.6 % — ABNORMAL HIGH (ref 11.5–15.5)
WBC: 11.2 10*3/uL — ABNORMAL HIGH (ref 4.0–10.5)

## 2014-08-17 LAB — COMPREHENSIVE METABOLIC PANEL
ALT: 17 U/L (ref 0–53)
AST: 15 U/L (ref 0–37)
Albumin: 3.9 g/dL (ref 3.5–5.2)
Alkaline Phosphatase: 43 U/L (ref 39–117)
BUN: 16 mg/dL (ref 6–23)
CO2: 29 mEq/L (ref 19–32)
Calcium: 9.4 mg/dL (ref 8.4–10.5)
Chloride: 100 mEq/L (ref 96–112)
Creat: 0.71 mg/dL (ref 0.50–1.35)
Glucose, Bld: 97 mg/dL (ref 70–99)
Potassium: 4 mEq/L (ref 3.5–5.3)
Sodium: 136 mEq/L (ref 135–145)
Total Bilirubin: 0.3 mg/dL (ref 0.2–1.2)
Total Protein: 7.8 g/dL (ref 6.0–8.3)

## 2014-08-17 LAB — CK: Total CK: 151 U/L (ref 7–232)

## 2014-08-17 LAB — SEDIMENTATION RATE: Sed Rate: 20 mm/hr (ref 0–20)

## 2014-08-17 NOTE — Progress Notes (Signed)
   Subjective:    Patient ID: Aaron Castro, male    DOB: June 27, 1950, 64 y.o.   MRN: [id number]  HPI He is here for consult concerning continued difficulty with pain. He states that he has pain all over his body but not in any one particular area. He states that this is no different than the chronic pains that he has had in the past but is just more intense. No fever, chills, sore throat, cough or congestion. He is on no new medications. He continues on medications listed in the chart.   Review of Systems     Objective:   Physical Exam Alert and in no distress. Tympanic membranes and canals are normal. Pharyngeal area is normal. Neck is supple without adenopathy or thyromegaly. Cardiac exam shows a regular sinus rhythm without murmurs or gallops. Lungs are clear to auscultation. No palpable tenderness to his arms, torso, legs.        Assessment & Plan:  Chronic pain - Plan: CK, Sedimentation rate, CBC with Differential/Platelet, Comprehensive metabolic panel  Arthritis - Plan: CK, Sedimentation rate, CBC with Differential/Platelet, Comprehensive metabolic panel  I explained that I will do blood work. I'm not sure that there is much came he done to help with this pain since it is diffuse. Hopefully the blood work will lend some knowledge to this.

## 2014-08-31 ENCOUNTER — Other Ambulatory Visit: Payer: Self-pay | Admitting: Family Medicine

## 2014-08-31 NOTE — Telephone Encounter (Signed)
Is this okay to send in? 

## 2014-09-10 ENCOUNTER — Ambulatory Visit
Admission: RE | Admit: 2014-09-10 | Discharge: 2014-09-10 | Disposition: A | Discharge status: Home / Self Care | Payer: Commercial Managed Care - HMO | Source: Ambulatory Visit | Attending: Medical | Admitting: Medical

## 2014-09-10 ENCOUNTER — Encounter: Payer: Self-pay | Admitting: Medical

## 2014-09-10 ENCOUNTER — Ambulatory Visit (INDEPENDENT_AMBULATORY_CARE_PROVIDER_SITE_OTHER): Payer: Commercial Managed Care - HMO | Admitting: Medical

## 2014-09-10 VITALS — BP 150/90 | HR 86 | Resp 20 | Wt 392.0 lb

## 2014-09-10 DIAGNOSIS — M25512 Pain in left shoulder: Secondary | ICD-10-CM

## 2014-09-10 DIAGNOSIS — E669 Obesity, unspecified: Secondary | ICD-10-CM

## 2014-09-10 DIAGNOSIS — I10 Essential (primary) hypertension: Secondary | ICD-10-CM

## 2014-09-10 DIAGNOSIS — M25511 Pain in right shoulder: Secondary | ICD-10-CM

## 2014-09-10 DIAGNOSIS — M25552 Pain in left hip: Secondary | ICD-10-CM

## 2014-09-10 DIAGNOSIS — M549 Dorsalgia, unspecified: Secondary | ICD-10-CM

## 2014-09-10 DIAGNOSIS — M79606 Pain in leg, unspecified: Secondary | ICD-10-CM

## 2014-09-10 DIAGNOSIS — M25551 Pain in right hip: Secondary | ICD-10-CM

## 2014-09-10 DIAGNOSIS — G894 Chronic pain syndrome: Secondary | ICD-10-CM
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
## 2014-09-10 MED ORDER — HYDROCODONE-ACETAMINOPHEN 5-325 MG PO TABS
1.0000 | ORAL_TABLET | Freq: Four times a day (QID) | ORAL | Status: DC | PRN
Start: 2014-09-10 — End: 2014-09-28

## 2014-09-10 MED ORDER — GABAPENTIN 300 MG PO CAPS: 300.0000 mg | ORAL_CAPSULE | Freq: Every day | ORAL | Status: DC
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
## 2014-09-28 ENCOUNTER — Ambulatory Visit (INDEPENDENT_AMBULATORY_CARE_PROVIDER_SITE_OTHER): Payer: Commercial Managed Care - HMO | Admitting: Family Medicine

## 2014-09-28 ENCOUNTER — Encounter: Payer: Self-pay | Admitting: Family Medicine

## 2014-09-28 VITALS — BP 124/72 | HR 76 | Wt 392.0 lb

## 2014-09-28 DIAGNOSIS — M25552 Pain in left hip: Secondary | ICD-10-CM

## 2014-09-28 DIAGNOSIS — M25551 Pain in right hip: Secondary | ICD-10-CM

## 2014-09-28 DIAGNOSIS — M79604 Pain in right leg: Secondary | ICD-10-CM

## 2014-09-28 DIAGNOSIS — M549 Dorsalgia, unspecified: Secondary | ICD-10-CM | POA: Diagnosis not present

## 2014-09-28 DIAGNOSIS — G894 Chronic pain syndrome: Secondary | ICD-10-CM

## 2014-09-28 DIAGNOSIS — M79605 Pain in left leg: Secondary | ICD-10-CM

## 2014-09-28 MED ORDER — TRAMADOL HCL 50 MG PO TABS: ORAL_TABLET | ORAL | Status: DC

## 2014-09-28 MED ORDER — GABAPENTIN 300 MG PO CAPS: 300.0000 mg | ORAL_CAPSULE | Freq: Every day | ORAL | Status: DC

## 2014-09-28 NOTE — Patient Instructions (Signed)
Take the Neurontin 3 times per day. Keep taking the sulindac. Increase the tramadol. Take 2 tramadol 2 or 3 times per day
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
## 2014-10-04 ENCOUNTER — Ambulatory Visit (HOSPITAL_COMMUNITY)
Admission: RE | Admit: 2014-10-04 | Discharge: 2014-10-04 | Disposition: A | Discharge status: Home / Self Care | Payer: Commercial Managed Care - HMO | Source: Ambulatory Visit | Attending: Family Medicine | Admitting: Family Medicine

## 2014-10-04 ENCOUNTER — Telehealth: Payer: Self-pay | Admitting: Family Medicine

## 2014-10-04 DIAGNOSIS — M79605 Pain in left leg: Principal | ICD-10-CM

## 2014-10-04 DIAGNOSIS — M79604 Pain in right leg: Secondary | ICD-10-CM

## 2014-10-04 DIAGNOSIS — M79609 Pain in unspecified limb: Secondary | ICD-10-CM | POA: Diagnosis not present

## 2014-10-04 MED ORDER — TRAMADOL HCL 50 MG PO TABS: ORAL_TABLET | ORAL | Status: DC

## 2014-10-04 MED ORDER — GABAPENTIN 300 MG PO CAPS: 300.0000 mg | ORAL_CAPSULE | Freq: Three times a day (TID) | ORAL | Status: DC

## 2014-10-04 NOTE — Telephone Encounter (Signed)
Call the tramadol in

## 2014-10-04 NOTE — Progress Notes (Signed)
VASCULAR LAB PRELIMINARY  ARTERIAL  ABI completed:    RIGHT    LEFT    PRESSURE WAVEFORM  PRESSURE WAVEFORM  BRACHIAL 173 Triphasic BRACHIAL 170 Triphasic  DP 148 Triphasic DP 161 Triphasic  PT 165 Triphasic PT 161 Triphasic    RIGHT LEFT  ABI 0.95 0.93   ABIs and Doppler waveforms ae normal bilaterally  Tonantzin Mimnaugh, RVS 10/04/2014, 1:04 PM

## 2014-10-04 NOTE — Telephone Encounter (Signed)
Pt requesting that we change his Neurontin and Tramadol scripts at the pharmacy since Dr Susann GivensLalonde changed the directions on these two meds at his last appointment. Pt is now taking the Neurontin 3 times a day and 2 tablets of the Tramadol 2-3 times a day. Pt would like to know when this has been done.

## 2014-10-04 NOTE — Telephone Encounter (Signed)
Dr.Lalonde I dont know the answer to this

## 2014-10-04 NOTE — Telephone Encounter (Signed)
The tramadol was called in his ? Is when and why changed

## 2014-12-28 ENCOUNTER — Ambulatory Visit (INDEPENDENT_AMBULATORY_CARE_PROVIDER_SITE_OTHER): Payer: Medicare Other | Admitting: Family Medicine

## 2014-12-28 ENCOUNTER — Encounter: Payer: Self-pay | Admitting: Family Medicine

## 2014-12-28 VITALS — BP 110/70 | HR 68 | Ht 72.0 in | Wt 384.0 lb

## 2014-12-28 DIAGNOSIS — M1990 Unspecified osteoarthritis, unspecified site: Secondary | ICD-10-CM

## 2014-12-28 DIAGNOSIS — I10 Essential (primary) hypertension: Secondary | ICD-10-CM

## 2014-12-28 DIAGNOSIS — Z87820 Personal history of traumatic brain injury: Secondary | ICD-10-CM

## 2014-12-28 DIAGNOSIS — Z0000 Encounter for general adult medical examination without abnormal findings: Secondary | ICD-10-CM

## 2014-12-28 DIAGNOSIS — G8929 Other chronic pain: Secondary | ICD-10-CM

## 2014-12-28 DIAGNOSIS — K5730 Diverticulosis of large intestine without perforation or abscess without bleeding: Secondary | ICD-10-CM | POA: Diagnosis not present
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
## 2015-01-07 ENCOUNTER — Other Ambulatory Visit: Payer: Self-pay | Admitting: Family Medicine

## 2015-01-28 ENCOUNTER — Other Ambulatory Visit: Payer: Self-pay | Admitting: Family Medicine

## 2015-02-03 ENCOUNTER — Other Ambulatory Visit: Payer: Self-pay | Admitting: Family Medicine

## 2015-02-03 NOTE — Telephone Encounter (Signed)
Is this okay?

## 2015-02-04 NOTE — Telephone Encounter (Signed)
Cheri JCL ok'd medication and I called into pharmacy. I'm not sure how to enter and close this out. Please take care of for me. Thanks.

## 2015-02-04 NOTE — Telephone Encounter (Signed)
JCL called in and I spoke to him concerning tramadol refills for this pt. He ok'd and I will call into pt's pharmacy.

## 2015-02-04 NOTE — Telephone Encounter (Signed)
ok 

## 2015-03-03 ENCOUNTER — Other Ambulatory Visit: Payer: Self-pay | Admitting: Family Medicine

## 2015-03-03 NOTE — Telephone Encounter (Signed)
Is this okay to refill? 

## 2015-04-04 ENCOUNTER — Other Ambulatory Visit: Payer: Self-pay | Admitting: Family Medicine

## 2015-04-06 ENCOUNTER — Other Ambulatory Visit: Payer: Self-pay

## 2015-04-06 ENCOUNTER — Other Ambulatory Visit: Payer: Self-pay | Admitting: Medical

## 2015-04-06 ENCOUNTER — Telehealth: Payer: Self-pay | Admitting: Family Medicine

## 2015-04-06 ENCOUNTER — Other Ambulatory Visit: Payer: Self-pay | Admitting: Family Medicine

## 2015-04-06 NOTE — Telephone Encounter (Signed)
When will he run out of medication?  Dr. Susann GivensLalonde is out of town this week.  If he can wait to Monday, will defer this to Dr. Susann GivensLalonde.   If he will run out this week, I can give 1 mo supply only.  Let me know

## 2015-04-06 NOTE — Telephone Encounter (Signed)
PT IS OUT OF MED I CALLED PHARMACY AND THERE WAS TO BE A REFILL BUT ONE WAS NOT CALLED IN SO I OKAYED THE REFILL

## 2015-04-06 NOTE — Telephone Encounter (Signed)
Pt called requesting a refill for his Rx Tramadol pt can be reached at [phone] pt uses [facility],[address]

## 2015-06-04 ENCOUNTER — Other Ambulatory Visit: Payer: Self-pay | Admitting: Family Medicine

## 2015-06-06 ENCOUNTER — Telehealth: Payer: Self-pay | Admitting: Family Medicine

## 2015-06-06 NOTE — Telephone Encounter (Signed)
Pt called and states Rx Tramadol still not at pharmacy.  Called Rx Tramadol in per Dr. Jola BabinskiLalonde's Rx

## 2015-07-07 ENCOUNTER — Ambulatory Visit: Payer: Medicare Other | Admitting: Family Medicine

## 2015-07-11 ENCOUNTER — Ambulatory Visit: Payer: Medicare Other | Admitting: Family Medicine

## 2015-07-14 ENCOUNTER — Encounter: Payer: Self-pay | Admitting: Family Medicine

## 2015-07-14 ENCOUNTER — Ambulatory Visit (INDEPENDENT_AMBULATORY_CARE_PROVIDER_SITE_OTHER): Payer: Medicare Other | Admitting: Family Medicine

## 2015-07-14 VITALS — BP 134/82 | HR 76 | Temp 98.5°F | Wt 384.8 lb

## 2015-07-14 DIAGNOSIS — Z7289 Other problems related to lifestyle: Secondary | ICD-10-CM | POA: Diagnosis not present

## 2015-07-14 DIAGNOSIS — Z1159 Encounter for screening for other viral diseases: Secondary | ICD-10-CM

## 2015-07-14 NOTE — Progress Notes (Signed)
   Subjective:    Patient ID: Aaron Castro, male    DOB: March 17, 1951, 65 y.o.   MRN: [id number]  HPI He is here for an interval evaluation. He has been seen and had a complete exam within the last year. He was seen by his insurance plan and pneumonia vaccination recommended. Review his record indicates that he is indeed had both of these. Further review his record indicates he will need hepatitis C screening. He has no other concerns or complaints.   Review of Systems     Objective:   Physical Exam Aaron Castro and in no distress otherwise not examined       Assessment & Plan:  Need for hepatitis C screening test - Plan: Hepatitis C antibody Return for regular follow-up visits.

## 2015-07-15 LAB — HEPATITIS C ANTIBODY: HCV Ab: NEGATIVE

## 2015-08-01 ENCOUNTER — Other Ambulatory Visit: Payer: Self-pay | Admitting: Family Medicine

## 2015-08-30 ENCOUNTER — Other Ambulatory Visit: Payer: Self-pay | Admitting: Family Medicine

## 2015-08-30 ENCOUNTER — Other Ambulatory Visit: Payer: Self-pay | Admitting: *Deleted

## 2015-08-30 MED ORDER — SULINDAC 150 MG PO TABS: 150.0000 mg | ORAL_TABLET | Freq: Two times a day (BID) | ORAL | Status: DC

## 2015-08-30 NOTE — Telephone Encounter (Signed)
Name from pharmacy:  In chart as:  SULINDAC 150MG  TABLETS sulindac (CLINORIL) 150 MG tablet     Sig: TAKE 1 TABLET BY MOUTH TWICE DAILY    Dispense: 60 tablet   Refills: 0   Start: 08/30/2015   Class: Normal    Requested on: 08/30/2015    Refill Requested is it ok?

## 2015-10-02 ENCOUNTER — Other Ambulatory Visit: Payer: Self-pay | Admitting: Family Medicine

## 2015-10-03 ENCOUNTER — Other Ambulatory Visit: Payer: Self-pay

## 2015-10-03 NOTE — Telephone Encounter (Signed)
Dr.lalonde is this okay to refill 

## 2015-10-03 NOTE — Telephone Encounter (Signed)
Is this okay?

## 2015-10-03 NOTE — Telephone Encounter (Signed)
Called in tramadol .  

## 2015-10-03 NOTE — Telephone Encounter (Signed)
Okay to renew

## 2015-12-31 ENCOUNTER — Other Ambulatory Visit: Payer: Self-pay | Admitting: Family Medicine

## 2016-01-02 NOTE — Telephone Encounter (Signed)
Is this okay to refill? 

## 2016-01-05 ENCOUNTER — Ambulatory Visit: Payer: Medicare Other | Admitting: Family Medicine

## 2016-01-18 ENCOUNTER — Ambulatory Visit (INDEPENDENT_AMBULATORY_CARE_PROVIDER_SITE_OTHER): Payer: Medicare Other | Admitting: Family Medicine

## 2016-01-18 ENCOUNTER — Encounter: Payer: Self-pay | Admitting: Family Medicine

## 2016-01-18 ENCOUNTER — Ambulatory Visit
Admission: RE | Admit: 2016-01-18 | Discharge: 2016-01-18 | Disposition: A | Discharge status: Home / Self Care | Payer: Medicare Other | Source: Ambulatory Visit | Attending: Family Medicine | Admitting: Family Medicine

## 2016-01-18 VITALS — BP 122/60 | HR 75 | Resp 18 | Ht 68.5 in | Wt 374.4 lb

## 2016-01-18 DIAGNOSIS — Z1211 Encounter for screening for malignant neoplasm of colon: Secondary | ICD-10-CM

## 2016-01-18 DIAGNOSIS — M79605 Pain in left leg: Secondary | ICD-10-CM

## 2016-01-18 DIAGNOSIS — I10 Essential (primary) hypertension: Secondary | ICD-10-CM | POA: Diagnosis not present

## 2016-01-18 DIAGNOSIS — M79604 Pain in right leg: Secondary | ICD-10-CM

## 2016-01-18 DIAGNOSIS — G894 Chronic pain syndrome: Secondary | ICD-10-CM | POA: Diagnosis not present

## 2016-01-18 DIAGNOSIS — K5730 Diverticulosis of large intestine without perforation or abscess without bleeding: Secondary | ICD-10-CM

## 2016-01-18 DIAGNOSIS — Z87820 Personal history of traumatic brain injury: Secondary | ICD-10-CM

## 2016-01-18 DIAGNOSIS — M1990 Unspecified osteoarthritis, unspecified site: Secondary | ICD-10-CM

## 2016-01-18 LAB — CBC WITH DIFFERENTIAL/PLATELET
Basophils Absolute: 0 cells/uL (ref 0–200)
Basophils Relative: 0 %
Eosinophils Absolute: 218 cells/uL (ref 15–500)
Eosinophils Relative: 2 %
HCT: 43.1 % (ref 38.5–50.0)
Hemoglobin: 14.5 g/dL (ref 13.2–17.1)
Lymphocytes Relative: 32 %
Lymphs Abs: 3488 cells/uL (ref 850–3900)
MCH: 28.3 pg (ref 27.0–33.0)
MCHC: 33.6 g/dL (ref 32.0–36.0)
MCV: 84 fL (ref 80.0–100.0)
MPV: 9.2 fL (ref 7.5–12.5)
Monocytes Absolute: 872 cells/uL (ref 200–950)
Monocytes Relative: 8 %
Neutro Abs: 6322 cells/uL (ref 1500–7800)
Neutrophils Relative %: 58 %
Platelets: 288 10*3/uL (ref 140–400)
RBC: 5.13 MIL/uL (ref 4.20–5.80)
RDW: 14.2 % (ref 11.0–15.0)
WBC: 10.9 10*3/uL — ABNORMAL HIGH (ref 4.0–10.5)
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
## 2016-01-18 MED ORDER — SULINDAC 150 MG PO TABS: 150.0000 mg | ORAL_TABLET | Freq: Two times a day (BID) | ORAL | 3 refills | Status: DC

## 2016-01-18 MED ORDER — LISINOPRIL-HYDROCHLOROTHIAZIDE 20-25 MG PO TABS: 1.0000 | ORAL_TABLET | Freq: Every day | ORAL | 3 refills | Status: DC

## 2016-01-18 MED ORDER — AMLODIPINE BESYLATE 5 MG PO TABS: 5.0000 mg | ORAL_TABLET | Freq: Every day | ORAL | 3 refills | Status: DC

## 2016-01-18 NOTE — Patient Instructions (Signed)
  Mr. Aaron Castro , Thank you for taking time to come for your Medicare Wellness Visit. I appreciate your ongoing commitment to your health goals. Please review the following plan we discussed and let me know if I can assist you in the future.   These are the goals we discussed: Goals    None      This is a list of the screening recommended for you and due dates:  Health Maintenance  Topic Date Due  . Shingles Vaccine  10/12/2008  . Flu Shot  01/24/2016  . Colon Cancer Screening  02/15/2016  . Tetanus Vaccine  04/10/2024  .  Hepatitis C: One time screening is recommended by Center for Disease Control  (CDC) for  adults born from [birth year] through 1967.   Completed  . Pneumonia vaccines  Completed
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
## 2016-01-19 LAB — COMPREHENSIVE METABOLIC PANEL
ALT: 24 U/L (ref 9–46)
AST: 23 U/L (ref 10–35)
Albumin: 4.1 g/dL (ref 3.6–5.1)
Alkaline Phosphatase: 41 U/L (ref 40–115)
BUN: 15 mg/dL (ref 7–25)
CO2: 30 mmol/L (ref 20–31)
Calcium: 9.7 mg/dL (ref 8.6–10.3)
Chloride: 99 mmol/L (ref 98–110)
Creat: 0.92 mg/dL (ref 0.70–1.25)
Glucose, Bld: 104 mg/dL — ABNORMAL HIGH (ref 65–99)
Potassium: 4 mmol/L (ref 3.5–5.3)
Sodium: 140 mmol/L (ref 135–146)
Total Bilirubin: 0.6 mg/dL (ref 0.2–1.2)
Total Protein: 7.3 g/dL (ref 6.1–8.1)

## 2016-01-19 LAB — LIPID PANEL
Cholesterol: 122 mg/dL — ABNORMAL LOW (ref 125–200)
HDL: 52 mg/dL (ref >=40)
LDL Cholesterol: 61 mg/dL (ref <130)
Total CHOL/HDL Ratio: 2.3 Ratio (ref <=5.0)
Triglycerides: 44 mg/dL (ref <150)
VLDL: 9 mg/dL (ref <30)

## 2016-01-24 ENCOUNTER — Encounter: Payer: Self-pay | Admitting: Gastroenterology

## 2016-02-01 ENCOUNTER — Other Ambulatory Visit: Payer: Self-pay | Admitting: Family Medicine

## 2016-02-01 NOTE — Telephone Encounter (Signed)
Is this okay to refill? 

## 2016-02-02 NOTE — Telephone Encounter (Signed)
Pharmacist said yes that it was called in 10/03/15 he picked up 10/04/15 and he picked up refill on 12/02/15 she said he does need a new refill

## 2016-02-02 NOTE — Telephone Encounter (Signed)
Patient said he just came in for a CPE he takes 2 tables 3 times a day so that's 180 a month the RX last him 60 days he said if you still want him to come he will but he just wanted you to know

## 2016-02-02 NOTE — Telephone Encounter (Signed)
The prescription written in April should last until October. Check with the pharmacy on this

## 2016-02-02 NOTE — Telephone Encounter (Signed)
He is taking it more often than needed. Have him come in for an appointment

## 2016-02-02 NOTE — Telephone Encounter (Signed)
I need to see him

## 2016-02-02 NOTE — Telephone Encounter (Signed)
Pt calling to f/u on this. [phone].

## 2016-02-03 ENCOUNTER — Other Ambulatory Visit: Payer: Self-pay | Admitting: Family Medicine

## 2016-02-03 ENCOUNTER — Telehealth: Payer: Self-pay | Admitting: Family Medicine

## 2016-02-03 ENCOUNTER — Ambulatory Visit (INDEPENDENT_AMBULATORY_CARE_PROVIDER_SITE_OTHER): Payer: Medicare Other | Admitting: Family Medicine

## 2016-02-03 ENCOUNTER — Encounter: Payer: Self-pay | Admitting: Family Medicine

## 2016-02-03 VITALS — BP 134/70 | HR 86 | Wt 374.0 lb

## 2016-02-03 DIAGNOSIS — G8929 Other chronic pain: Secondary | ICD-10-CM

## 2016-02-03 MED ORDER — DICLOFENAC SODIUM 75 MG PO TBEC: 75.0000 mg | DELAYED_RELEASE_TABLET | Freq: Two times a day (BID) | ORAL | 1 refills | Status: DC

## 2016-02-03 NOTE — Telephone Encounter (Signed)
Call him and that him know you called it in

## 2016-02-03 NOTE — Telephone Encounter (Signed)
Pt informed

## 2016-02-03 NOTE — Telephone Encounter (Signed)
Called tramadol in 

## 2016-02-03 NOTE — Telephone Encounter (Signed)
I called this in so it should not need to be refilled

## 2016-02-03 NOTE — Patient Instructions (Signed)
Take the Voltaren twice per day and try to back off on taking the tramadol to just twice per day. Call me in about a month and let me know how you're doing

## 2016-02-03 NOTE — Telephone Encounter (Signed)
Pt called stating that Tramadol is not at the pharmacy yet. He would like to be called when this med is sent in so he knows when to go back over to the pharmacy

## 2016-02-03 NOTE — Telephone Encounter (Signed)
Is this okay to refill? 

## 2016-02-03 NOTE — Progress Notes (Signed)
   Subjective:    Patient ID: Aaron SermonsJames E Castro, male    DOB: 03-Mar-1951, 65 y.o.   MRN: [id number]  HPI He is here for consult concerning continued pain. He has a history of chronic pain dating back several years. He has been relatively stable on tramadol and an inside but in the last several months, he has increased his tramadol use.   Review of Systems     Objective:   Physical Exam Alert and in no distress otherwise not examined       Assessment & Plan:  Chronic pain - Plan: diclofenac (VOLTAREN) 75 MG EC tablet I expressed the fact that I was not interested in having him continue on the relatively high dose of the tramadol. I will switch him to bulk Aaron BraunKaren and have him stop the sulindac. He is to contact me in a couple weeks. Expressive fact that we might need to possibly have him seen by pain management.

## 2016-02-03 NOTE — Telephone Encounter (Signed)
I think you took care of this already 

## 2016-02-03 NOTE — Telephone Encounter (Signed)
Pt has appointment 

## 2016-03-08 ENCOUNTER — Telehealth: Payer: Self-pay | Admitting: *Deleted

## 2016-03-08 NOTE — Telephone Encounter (Signed)
Called pt and explained needs Ov- cancelled 9-25 Castro and colon 10-9, OV 11-28 at 9 am with dr Elizebeth Kollerarmbruster   Aaron Castro

## 2016-03-08 NOTE — Telephone Encounter (Signed)
Thanks for catching this, yes office visit would be preferred. Thanks

## 2016-03-08 NOTE — Telephone Encounter (Signed)
Dr Adela LankArmbruster,  This patient is scheduled for a direct colon with you 10-9 Monday  In the [facility].   Reviewing his chart he has a weight of 374.0lb and a BMI of 56.2.   He has a hx of tics, chronic pain, HTN, arthritis and hx of TBI. No GI office visits noted in epic. Do [facility] want an [facility]v or [facility]? Please advise.  Thanks for your time, Elizebeth BrookingMarie PV

## 2016-04-02 ENCOUNTER — Encounter: Payer: Medicare Other | Admitting: Gastroenterology

## 2016-04-14 ENCOUNTER — Other Ambulatory Visit: Payer: Self-pay | Admitting: Family Medicine

## 2016-04-14 DIAGNOSIS — G8929 Other chronic pain: Secondary | ICD-10-CM

## 2016-04-16 NOTE — Telephone Encounter (Signed)
Is this okay to refill? 

## 2016-05-11 ENCOUNTER — Telehealth: Payer: Self-pay | Admitting: Family Medicine

## 2016-05-11 NOTE — Telephone Encounter (Signed)
error 

## 2016-05-21 ENCOUNTER — Other Ambulatory Visit: Payer: Self-pay | Admitting: Family Medicine

## 2016-05-21 NOTE — Telephone Encounter (Signed)
Okay to renew

## 2016-05-21 NOTE — Telephone Encounter (Signed)
Is this okay to refill? 

## 2016-05-21 NOTE — Telephone Encounter (Signed)
Called in Tramadol per jcl 

## 2016-05-22 ENCOUNTER — Ambulatory Visit (INDEPENDENT_AMBULATORY_CARE_PROVIDER_SITE_OTHER): Payer: Medicare Other | Admitting: Gastroenterology

## 2016-05-22 ENCOUNTER — Encounter (INDEPENDENT_AMBULATORY_CARE_PROVIDER_SITE_OTHER): Payer: Self-pay

## 2016-05-22 ENCOUNTER — Encounter: Payer: Self-pay | Admitting: Gastroenterology

## 2016-05-22 VITALS — BP 146/70 | HR 84 | Ht 68.5 in | Wt 384.2 lb

## 2016-05-22 DIAGNOSIS — Z1211 Encounter for screening for malignant neoplasm of colon: Secondary | ICD-10-CM | POA: Diagnosis not present

## 2016-05-22 DIAGNOSIS — K5900 Constipation, unspecified: Secondary | ICD-10-CM

## 2016-05-22 MED ORDER — NA SULFATE-K SULFATE-MG SULF 17.5-3.13-1.6 GM/177ML PO SOLN: 1.0000 | Freq: Once | ORAL | 0 refills | Status: AC

## 2016-05-22 MED ORDER — POLYETHYLENE GLYCOL 3350 17 GM/SCOOP PO POWD: 1.0000 | Freq: Every day | ORAL | 3 refills | Status: DC

## 2016-05-22 NOTE — Patient Instructions (Signed)
If you are age 67 or older, your body mass index should be between 23-30. Your Body mass index is 57.58 kg/m. If this is out of the aforementioned range listed, please consider follow up with your Primary Care Provider.  If you are age 67 or younger, your body mass index should be between 19-25. Your Body mass index is 57.58 kg/m. If this is out of the aformentioned range listed, please consider follow up with your Primary Care Provider.   We have sent the following medications to your pharmacy for you to pick up at your convenience:  Suprep  Miralax. Take daily. May increase to 2-3 times a day as needed.  You have been scheduled for a colonoscopy. Please follow written instructions given to you at your visit today.  Please pick up your prep supplies at the pharmacy within the next 1-3 days. If you use inhalers (even only as needed), please bring them with you on the day of your procedure. Your physician has requested that you go to www.startemmi.com and enter the access code given to you at your visit today. This web site gives a general overview about your procedure. However, you should still follow specific instructions given to you by our office regarding your preparation for the procedure.
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
## 2016-05-28 ENCOUNTER — Other Ambulatory Visit (INDEPENDENT_AMBULATORY_CARE_PROVIDER_SITE_OTHER): Payer: Medicare Other

## 2016-05-28 DIAGNOSIS — Z23 Encounter for immunization: Secondary | ICD-10-CM | POA: Diagnosis not present

## 2016-06-27 ENCOUNTER — Encounter (HOSPITAL_COMMUNITY): Payer: Self-pay | Admitting: *Deleted

## 2016-06-28 NOTE — Anesthesia Preprocedure Evaluation (Addendum)
Anesthesia Evaluation  Patient identified by MRN, date of birth, ID band Patient awake    Reviewed: Allergy & Precautions, NPO status , Patient's Chart, lab work & pertinent test results  History of Anesthesia Complications Negative for: history of anesthetic complications  Airway Mallampati: II  TM Distance: >3 FB Neck ROM: Full    Dental no notable dental hx. (+) Dental Advisory Given   Pulmonary former smoker,    Pulmonary exam normal        Cardiovascular hypertension, Pt. on medications Normal cardiovascular exam     Neuro/Psych negative neurological ROS  negative psych ROS   GI/Hepatic Neg liver ROS,   Endo/Other  Morbid obesity  Renal/GU negative Renal ROS     Musculoskeletal   Abdominal   Peds  Hematology   Anesthesia Other Findings   Reproductive/Obstetrics                            Anesthesia Physical  Anesthesia Plan  ASA: III  Anesthesia Plan: MAC   Post-op Pain Management:    Induction:   Airway Management Planned: Natural Airway and Simple Face Mask  Additional Equipment:   Intra-op Plan:   Post-operative Plan:   Informed Consent: I have reviewed the patients History and Physical, chart, labs and discussed the procedure including the risks, benefits and alternatives for the proposed anesthesia with the patient or authorized representative who has indicated his/her understanding and acceptance.   Dental advisory given  Plan Discussed with: CRNA and Anesthesiologist  Anesthesia Plan Comments:        Anesthesia Quick Evaluation

## 2016-06-29 ENCOUNTER — Ambulatory Visit (HOSPITAL_COMMUNITY): Payer: Medicare Other | Admitting: Anesthesiology

## 2016-06-29 ENCOUNTER — Encounter (HOSPITAL_COMMUNITY)
Admission: RE | Disposition: A | Discharge status: Home / Self Care | Payer: Self-pay | Source: Ambulatory Visit | Attending: Gastroenterology

## 2016-06-29 ENCOUNTER — Ambulatory Visit (HOSPITAL_COMMUNITY)
Admission: RE | Admit: 2016-06-29 | Discharge: 2016-06-29 | Disposition: A | Discharge status: Home / Self Care | Payer: Medicare Other | Source: Ambulatory Visit | Attending: Gastroenterology | Admitting: Gastroenterology

## 2016-06-29 ENCOUNTER — Encounter (HOSPITAL_COMMUNITY): Payer: Self-pay

## 2016-06-29 DIAGNOSIS — Z79899 Other long term (current) drug therapy: Secondary | ICD-10-CM | POA: Diagnosis not present

## 2016-06-29 DIAGNOSIS — I10 Essential (primary) hypertension: Secondary | ICD-10-CM | POA: Insufficient documentation

## 2016-06-29 DIAGNOSIS — Z6841 Body Mass Index (BMI) 40.0 and over, adult: Secondary | ICD-10-CM | POA: Diagnosis not present

## 2016-06-29 DIAGNOSIS — K648 Other hemorrhoids: Secondary | ICD-10-CM | POA: Insufficient documentation

## 2016-06-29 DIAGNOSIS — M1990 Unspecified osteoarthritis, unspecified site: Secondary | ICD-10-CM | POA: Insufficient documentation

## 2016-06-29 DIAGNOSIS — Z1211 Encounter for screening for malignant neoplasm of colon: Secondary | ICD-10-CM

## 2016-06-29 DIAGNOSIS — Z87891 Personal history of nicotine dependence: Secondary | ICD-10-CM | POA: Insufficient documentation

## 2016-06-29 DIAGNOSIS — Z1212 Encounter for screening for malignant neoplasm of rectum: Secondary | ICD-10-CM

## 2016-06-29 DIAGNOSIS — K5730 Diverticulosis of large intestine without perforation or abscess without bleeding: Secondary | ICD-10-CM | POA: Insufficient documentation

## 2016-06-29 HISTORY — DX: Dyspnea, unspecified: R06.00

## 2016-06-29 HISTORY — PX: COLONOSCOPY: SHX5424

## 2016-06-29 SURGERY — COLONOSCOPY
Anesthesia: Monitor Anesthesia Care

## 2016-06-29 MED ORDER — PROPOFOL 10 MG/ML IV BOLUS
INTRAVENOUS | Status: AC
  Filled 2016-06-29: qty 60

## 2016-06-29 MED ORDER — HYDROMORPHONE HCL 1 MG/ML IJ SOLN: 0.2500 mg | INTRAMUSCULAR | Status: DC | PRN

## 2016-06-29 MED ORDER — LIDOCAINE 2% (20 MG/ML) 5 ML SYRINGE
INTRAMUSCULAR | Status: AC
  Filled 2016-06-29: qty 5

## 2016-06-29 MED ORDER — LACTATED RINGERS IV SOLN
INTRAVENOUS | Status: DC
  Administered 2016-06-29: 09:00:00 via INTRAVENOUS

## 2016-06-29 MED ORDER — LIDOCAINE 2% (20 MG/ML) 5 ML SYRINGE
INTRAMUSCULAR | Status: DC | PRN
  Administered 2016-06-29: 100 mg via INTRAVENOUS

## 2016-06-29 MED ORDER — PROMETHAZINE HCL 25 MG/ML IJ SOLN: 6.2500 mg | INTRAMUSCULAR | Status: DC | PRN

## 2016-06-29 MED ORDER — PROPOFOL 10 MG/ML IV BOLUS
INTRAVENOUS | Status: DC | PRN
  Administered 2016-06-29: 10 mg via INTRAVENOUS
  Administered 2016-06-29: 50 mg via INTRAVENOUS
  Administered 2016-06-29 (×3): 10 mg via INTRAVENOUS
  Administered 2016-06-29: 50 mg via INTRAVENOUS

## 2016-06-29 MED ORDER — PROPOFOL 500 MG/50ML IV EMUL
INTRAVENOUS | Status: DC | PRN
  Administered 2016-06-29: 100 ug/kg/min via INTRAVENOUS

## 2016-06-29 NOTE — Transfer of Care (Signed)
Immediate Anesthesia Transfer of Care Note  Patient: Aaron Castro  Procedure(s) Performed: Procedure(s): COLONOSCOPY (N/A)  Patient Location: PACU  Anesthesia Type:MAC  Level of Consciousness: Patient easily awoken, sedated, comfortable, cooperative, following commands, responds to stimulation.   Airway & Oxygen Therapy: Patient spontaneously breathing, ventilating well, oxygen via simple oxygen mask.  Post-op Assessment: Report given to PACU RN, vital signs reviewed and stable, moving all extremities.   Post vital signs: Reviewed and stable.  Complications: No apparent anesthesia complications  Last Vitals:  Vitals:   06/29/16 0827  BP: (!) 151/76  Pulse: 76  Resp: (!) 21  Temp: 36.8 C    Last Pain:  Vitals:   06/29/16 0827  TempSrc: Oral         Complications: No apparent anesthesia complications

## 2016-06-29 NOTE — Interval H&P Note (Signed)
History and Physical Interval Note:  06/29/2016 8:44 AM  Guy SandiferJames E Burkel  has presented today for surgery, with the diagnosis of constipation  The various methods of treatment have been discussed with the patient and family. After consideration of risks, benefits and other options for treatment, the patient has consented to  Procedure(s): COLONOSCOPY (N/A) as a surgical intervention .  The patient's history has been reviewed, patient examined, no change in status, stable for surgery.  I have reviewed the patient's chart and labs.  Questions were answered to the patient's satisfaction.     Reeves ForthSteven Paul Armbruster
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
## 2016-06-29 NOTE — Discharge Instructions (Signed)
YOU HAD AN ENDOSCOPIC PROCEDURE TODAY: Refer to the procedure report and other information in the discharge instructions given to you for any specific questions about what was found during the examination. If this information does not answer your questions, please call [place] office at [phone] to clarify.  ° °YOU SHOULD EXPECT: Some feelings of bloating in the abdomen. Passage of more gas than usual. Walking can help get rid of the air that was put into your GI tract during the procedure and reduce the bloating. If you had a lower endoscopy (such as a colonoscopy or flexible sigmoidoscopy) you may notice spotting of blood in your stool or on the toilet paper. Some abdominal soreness may be present for a day or two, also. ° °DIET: Your first meal following the procedure should be a light meal and then it is ok to progress to your normal diet. A half-sandwich or bowl of soup is an example of a good first meal. Heavy or fried foods are harder to digest and may make you feel nauseous or bloated. Drink plenty of fluids but you should avoid alcoholic beverages for 24 hours. If you had a esophageal dilation, please see attached instructions for diet.   ° °ACTIVITY: Your care partner should take you home directly after the procedure. You should plan to take it easy, moving slowly for the rest of the day. You can resume normal activity the day after the procedure however YOU SHOULD NOT DRIVE, use power tools, machinery or perform tasks that involve climbing or major physical exertion for 24 hours (because of the sedation medicines used during the test).  ° °SYMPTOMS TO REPORT IMMEDIATELY: °A gastroenterologist can be reached at any hour. Please call [phone]  for any of the following symptoms:  °Following lower endoscopy (colonoscopy, flexible sigmoidoscopy) °Excessive amounts of blood in the stool  °Significant tenderness, worsening of abdominal pains  °Swelling of the abdomen that is new, acute  °Fever of 100° or  higher  °Following upper endoscopy (EGD, EUS, ERCP, esophageal dilation) °Vomiting of blood or coffee ground material  °New, significant abdominal pain  °New, significant chest pain or pain under the shoulder blades  °Painful or persistently difficult swallowing  °New shortness of breath  °Black, tarry-looking or red, bloody stools ° °FOLLOW UP:  °If any biopsies were taken you will be contacted by phone or by letter within the next 1-3 weeks. Call [phone]  if you have not heard about the biopsies in 3 weeks.  °Please also call with any specific questions about appointments or follow up tests. ° °

## 2016-06-29 NOTE — Anesthesia Postprocedure Evaluation (Signed)
Anesthesia Post Note  Patient: Aaron Castro  Procedure(s) Performed: Procedure(s) (LRB): COLONOSCOPY (N/A)  Patient location during evaluation: Endoscopy Anesthesia Type: MAC Level of consciousness: awake and alert Pain management: pain level controlled Vital Signs Assessment: post-procedure vital signs reviewed and stable Respiratory status: spontaneous breathing and respiratory function stable Cardiovascular status: stable Anesthetic complications: no       Last Vitals:  Vitals:   06/29/16 1020 06/29/16 1030  BP: (!) 131/58 (!) 121/46  Pulse: 84 70  Resp: 14 13  Temp:      Last Pain:  Vitals:   06/29/16 0827  TempSrc: Oral                 [name],[name]
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
## 2016-07-02 ENCOUNTER — Encounter (HOSPITAL_COMMUNITY): Payer: Self-pay | Admitting: Gastroenterology

## 2016-08-05 ENCOUNTER — Other Ambulatory Visit: Payer: Self-pay | Admitting: Family Medicine

## 2016-09-29 ENCOUNTER — Other Ambulatory Visit: Payer: Self-pay | Admitting: Family Medicine

## 2016-09-29 DIAGNOSIS — G8929 Other chronic pain: Secondary | ICD-10-CM

## 2016-10-01 ENCOUNTER — Other Ambulatory Visit: Payer: Self-pay | Admitting: Family Medicine

## 2016-10-01 NOTE — Telephone Encounter (Signed)
Is this okay to refill? 

## 2016-11-12 ENCOUNTER — Other Ambulatory Visit: Payer: Self-pay | Admitting: Family Medicine

## 2016-11-12 DIAGNOSIS — G8929 Other chronic pain: Secondary | ICD-10-CM

## 2016-11-12 NOTE — Telephone Encounter (Signed)
Is this okay to refill? 

## 2016-12-07 ENCOUNTER — Other Ambulatory Visit: Payer: Self-pay | Admitting: Family Medicine

## 2016-12-07 DIAGNOSIS — G8929 Other chronic pain: Secondary | ICD-10-CM

## 2016-12-07 NOTE — Telephone Encounter (Signed)
Is this okay to refill? 

## 2016-12-10 NOTE — Telephone Encounter (Signed)
Called in tramadol per jcl 

## 2017-01-29 ENCOUNTER — Other Ambulatory Visit: Payer: Self-pay | Admitting: Family Medicine

## 2017-01-29 NOTE — Telephone Encounter (Signed)
Is this okay to refill? 

## 2017-02-02 ENCOUNTER — Other Ambulatory Visit: Payer: Self-pay | Admitting: Family Medicine

## 2017-02-02 DIAGNOSIS — I10 Essential (primary) hypertension: Secondary | ICD-10-CM

## 2017-04-01 ENCOUNTER — Other Ambulatory Visit: Payer: Self-pay | Admitting: Family Medicine

## 2017-04-01 DIAGNOSIS — I10 Essential (primary) hypertension: Secondary | ICD-10-CM

## 2017-04-01 NOTE — Telephone Encounter (Signed)
Pt has scheduled an appt for December 12 for fasting cpe/awv so I will refill med until then

## 2017-05-29 ENCOUNTER — Other Ambulatory Visit: Payer: Self-pay | Admitting: Family Medicine

## 2017-05-29 DIAGNOSIS — I10 Essential (primary) hypertension: Secondary | ICD-10-CM

## 2017-06-05 ENCOUNTER — Encounter: Payer: Self-pay | Admitting: Family Medicine

## 2017-06-05 ENCOUNTER — Ambulatory Visit (INDEPENDENT_AMBULATORY_CARE_PROVIDER_SITE_OTHER): Payer: Medicare Other | Admitting: Family Medicine

## 2017-06-05 VITALS — BP 142/80 | HR 90 | Temp 98.0°F | Ht 72.0 in | Wt 383.0 lb

## 2017-06-05 DIAGNOSIS — N401 Enlarged prostate with lower urinary tract symptoms: Secondary | ICD-10-CM | POA: Diagnosis not present

## 2017-06-05 DIAGNOSIS — Z136 Encounter for screening for cardiovascular disorders: Secondary | ICD-10-CM | POA: Diagnosis not present

## 2017-06-05 DIAGNOSIS — Z0000 Encounter for general adult medical examination without abnormal findings: Secondary | ICD-10-CM

## 2017-06-05 DIAGNOSIS — I10 Essential (primary) hypertension: Secondary | ICD-10-CM

## 2017-06-05 DIAGNOSIS — G894 Chronic pain syndrome: Secondary | ICD-10-CM | POA: Diagnosis not present

## 2017-06-05 DIAGNOSIS — M1990 Unspecified osteoarthritis, unspecified site: Secondary | ICD-10-CM | POA: Diagnosis not present

## 2017-06-05 DIAGNOSIS — R3911 Hesitancy of micturition: Secondary | ICD-10-CM

## 2017-06-05 DIAGNOSIS — N529 Male erectile dysfunction, unspecified: Secondary | ICD-10-CM

## 2017-06-05 DIAGNOSIS — Z87820 Personal history of traumatic brain injury: Secondary | ICD-10-CM

## 2017-06-05 DIAGNOSIS — Z87891 Personal history of nicotine dependence: Secondary | ICD-10-CM

## 2017-06-05 LAB — POCT URINALYSIS DIP (PROADVANTAGE DEVICE)
Bilirubin, UA: NEGATIVE
Blood, UA: NEGATIVE
Glucose, UA: NEGATIVE mg/dL
Ketones, POC UA: NEGATIVE mg/dL
Leukocytes, UA: NEGATIVE
Nitrite, UA: NEGATIVE
Protein Ur, POC: NEGATIVE mg/dL
Specific Gravity, Urine: 1.03
Urobilinogen, Ur: NEGATIVE
pH, UA: 6 (ref 5.0–8.0)

## 2017-06-05 MED ORDER — SILDENAFIL CITRATE 20 MG PO TABS: ORAL_TABLET | ORAL | 2 refills | Status: DC

## 2017-06-05 MED ORDER — AMLODIPINE BESYLATE 5 MG PO TABS: 5.0000 mg | ORAL_TABLET | Freq: Every day | ORAL | 3 refills | Status: DC

## 2017-06-05 MED ORDER — FINASTERIDE 5 MG PO TABS: 5.0000 mg | ORAL_TABLET | Freq: Every day | ORAL | 3 refills | Status: DC

## 2017-06-05 MED ORDER — SILDENAFIL CITRATE 20 MG PO TABS: 20.0000 mg | ORAL_TABLET | Freq: Three times a day (TID) | ORAL | 2 refills | Status: DC

## 2017-06-05 MED ORDER — LISINOPRIL-HYDROCHLOROTHIAZIDE 20-25 MG PO TABS: 1.0000 | ORAL_TABLET | Freq: Every day | ORAL | 3 refills | Status: DC
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
## 2017-06-05 NOTE — Addendum Note (Signed)
Addended by: Darene LamerHOMPSON, Narciso Stoutenburg T on: 06/05/2017 10:38 AM   Modules accepted: Orders

## 2017-06-08 LAB — CBC WITH DIFFERENTIAL/PLATELET
Basophils Absolute: 26 cells/uL (ref 0–200)
Basophils Relative: 0.3 %
Eosinophils Absolute: 129 cells/uL (ref 15–500)
Eosinophils Relative: 1.5 %
HCT: 44.2 % (ref 38.5–50.0)
Hemoglobin: 14.8 g/dL (ref 13.2–17.1)
Lymphs Abs: 2890 cells/uL (ref 850–3900)
MCH: 28.1 pg (ref 27.0–33.0)
MCHC: 33.5 g/dL (ref 32.0–36.0)
MCV: 84 fL (ref 80.0–100.0)
MPV: 9.6 fL (ref 7.5–12.5)
Monocytes Relative: 7.5 %
Neutro Abs: 4911 cells/uL (ref 1500–7800)
Neutrophils Relative %: 57.1 %
Platelets: 261 10*3/uL (ref 140–400)
RBC: 5.26 10*6/uL (ref 4.20–5.80)
RDW: 12.9 % (ref 11.0–15.0)
Total Lymphocyte: 33.6 %
WBC mixed population: 645 cells/uL (ref 200–950)
WBC: 8.6 10*3/uL (ref 3.8–10.8)

## 2017-06-08 LAB — TEST AUTHORIZATION

## 2017-06-08 LAB — COMPREHENSIVE METABOLIC PANEL
AG Ratio: 1.2 (calc) (ref 1.0–2.5)
ALT: 26 U/L (ref 9–46)
AST: 22 U/L (ref 10–35)
Albumin: 3.9 g/dL (ref 3.6–5.1)
Alkaline phosphatase (APISO): 37 U/L — ABNORMAL LOW (ref 40–115)
BUN: 13 mg/dL (ref 7–25)
CO2: 29 mmol/L (ref 20–32)
Calcium: 9.5 mg/dL (ref 8.6–10.3)
Chloride: 103 mmol/L (ref 98–110)
Creat: 0.88 mg/dL (ref 0.70–1.25)
Globulin: 3.3 g/dL (calc) (ref 1.9–3.7)
Glucose, Bld: 116 mg/dL — ABNORMAL HIGH (ref 65–99)
Potassium: 3.7 mmol/L (ref 3.5–5.3)
Sodium: 139 mmol/L (ref 135–146)
Total Bilirubin: 0.3 mg/dL (ref 0.2–1.2)
Total Protein: 7.2 g/dL (ref 6.1–8.1)

## 2017-06-08 LAB — HEMOGLOBIN A1C W/OUT EAG: Hgb A1c MFr Bld: 5.8 % of total Hgb — ABNORMAL HIGH (ref <5.7)

## 2017-06-09 ENCOUNTER — Other Ambulatory Visit: Payer: Self-pay | Admitting: Family Medicine

## 2017-06-10 NOTE — Telephone Encounter (Signed)
Is this okay to refill? 

## 2017-06-27 ENCOUNTER — Encounter (HOSPITAL_COMMUNITY): Payer: Self-pay | Admitting: *Deleted

## 2017-06-27 ENCOUNTER — Emergency Department (HOSPITAL_COMMUNITY): Payer: No Typology Code available for payment source

## 2017-06-27 ENCOUNTER — Emergency Department (HOSPITAL_COMMUNITY)
Admission: EM | Admit: 2017-06-27 | Discharge: 2017-06-27 | Disposition: A | Discharge status: Home / Self Care | Payer: No Typology Code available for payment source | Attending: Emergency Medicine | Admitting: Emergency Medicine

## 2017-06-27 DIAGNOSIS — Z79899 Other long term (current) drug therapy: Secondary | ICD-10-CM | POA: Diagnosis not present

## 2017-06-27 DIAGNOSIS — Y999 Unspecified external cause status: Secondary | ICD-10-CM | POA: Insufficient documentation

## 2017-06-27 DIAGNOSIS — S40012A Contusion of left shoulder, initial encounter: Secondary | ICD-10-CM

## 2017-06-27 DIAGNOSIS — Z87891 Personal history of nicotine dependence: Secondary | ICD-10-CM | POA: Insufficient documentation

## 2017-06-27 DIAGNOSIS — S161XXA Strain of muscle, fascia and tendon at neck level, initial encounter: Secondary | ICD-10-CM | POA: Diagnosis not present

## 2017-06-27 DIAGNOSIS — I10 Essential (primary) hypertension: Secondary | ICD-10-CM | POA: Diagnosis not present

## 2017-06-27 DIAGNOSIS — S4992XA Unspecified injury of left shoulder and upper arm, initial encounter: Secondary | ICD-10-CM | POA: Diagnosis present

## 2017-06-27 DIAGNOSIS — S7002XA Contusion of left hip, initial encounter: Secondary | ICD-10-CM

## 2017-06-27 DIAGNOSIS — Y9389 Activity, other specified: Secondary | ICD-10-CM | POA: Diagnosis not present

## 2017-06-27 DIAGNOSIS — Y92410 Unspecified street and highway as the place of occurrence of the external cause: Secondary | ICD-10-CM | POA: Insufficient documentation
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
## 2017-06-27 MED ORDER — METHOCARBAMOL 500 MG PO TABS: 500.0000 mg | ORAL_TABLET | Freq: Two times a day (BID) | ORAL | 0 refills | Status: DC

## 2017-06-27 NOTE — Discharge Instructions (Signed)
Continue your regular pain medications.  Take Robaxin as prescribed as needed for muscle spasms.  Try heating pads.  Avoid any strenuous activity.  Stretch.  Follow-up with family doctor as needed.
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
## 2017-07-04 ENCOUNTER — Encounter: Payer: Self-pay | Admitting: Family Medicine

## 2017-07-04 ENCOUNTER — Ambulatory Visit (INDEPENDENT_AMBULATORY_CARE_PROVIDER_SITE_OTHER): Payer: Medicare Other | Admitting: Family Medicine

## 2017-07-04 DIAGNOSIS — M25512 Pain in left shoulder: Secondary | ICD-10-CM

## 2017-07-04 DIAGNOSIS — S161XXA Strain of muscle, fascia and tendon at neck level, initial encounter: Secondary | ICD-10-CM
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
## 2017-07-04 NOTE — Patient Instructions (Signed)
To your neck and shoulder for 20 minutes 3 times per day and gentle stretching after that.  You can use your tramadol and Tylenol for pain.

## 2017-08-01 ENCOUNTER — Other Ambulatory Visit: Payer: Self-pay | Admitting: Family Medicine

## 2017-08-01 NOTE — Telephone Encounter (Signed)
Is this okay to refill? 

## 2017-08-27 ENCOUNTER — Other Ambulatory Visit: Payer: Self-pay | Admitting: Family Medicine

## 2017-08-27 DIAGNOSIS — I10 Essential (primary) hypertension: Secondary | ICD-10-CM

## 2017-10-17 ENCOUNTER — Other Ambulatory Visit: Payer: Self-pay | Admitting: Family Medicine

## 2017-10-17 DIAGNOSIS — G8929 Other chronic pain: Secondary | ICD-10-CM

## 2017-10-17 NOTE — Telephone Encounter (Signed)
Is this okay to refill? 

## 2017-11-21 ENCOUNTER — Other Ambulatory Visit: Payer: Self-pay | Admitting: Family Medicine

## 2017-11-21 NOTE — Telephone Encounter (Signed)
[facility] want to fill pt tramadol. Please advise [facility]

## 2018-05-16 ENCOUNTER — Other Ambulatory Visit: Payer: Self-pay | Admitting: Family Medicine

## 2018-05-16 NOTE — Telephone Encounter (Signed)
[facility] is requesting to fill pt tramadol. Please advise [facility] 

## 2018-05-23 ENCOUNTER — Other Ambulatory Visit: Payer: Self-pay | Admitting: Family Medicine

## 2018-05-23 DIAGNOSIS — N401 Enlarged prostate with lower urinary tract symptoms: Secondary | ICD-10-CM

## 2018-05-23 DIAGNOSIS — R3911 Hesitancy of micturition: Principal | ICD-10-CM

## 2018-05-26 ENCOUNTER — Other Ambulatory Visit: Payer: Self-pay | Admitting: Family Medicine

## 2018-05-26 DIAGNOSIS — N401 Enlarged prostate with lower urinary tract symptoms: Secondary | ICD-10-CM

## 2018-05-26 DIAGNOSIS — R3911 Hesitancy of micturition: Principal | ICD-10-CM

## 2018-06-06 ENCOUNTER — Encounter: Payer: Self-pay | Admitting: Family Medicine

## 2018-06-06 ENCOUNTER — Ambulatory Visit (INDEPENDENT_AMBULATORY_CARE_PROVIDER_SITE_OTHER): Payer: Medicare Other | Admitting: Family Medicine

## 2018-06-06 VITALS — BP 134/82 | HR 78 | Temp 97.7°F | Wt 378.4 lb

## 2018-06-06 DIAGNOSIS — Z23 Encounter for immunization: Secondary | ICD-10-CM | POA: Diagnosis not present

## 2018-06-06 DIAGNOSIS — G894 Chronic pain syndrome: Secondary | ICD-10-CM | POA: Diagnosis not present
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
## 2018-06-07 LAB — SEDIMENTATION RATE: Sed Rate: 32 mm/hr — ABNORMAL HIGH (ref 0–30)

## 2018-06-07 LAB — CBC WITH DIFFERENTIAL/PLATELET
Basophils Absolute: 0 10*3/uL (ref 0.0–0.2)
Basos: 1 %
EOS (ABSOLUTE): 0.2 10*3/uL (ref 0.0–0.4)
Eos: 3 %
Hematocrit: 45.2 % (ref 37.5–51.0)
Hemoglobin: 14.8 g/dL (ref 13.0–17.7)
Immature Grans (Abs): 0 10*3/uL (ref 0.0–0.1)
Immature Granulocytes: 0 %
Lymphocytes Absolute: 2.4 10*3/uL (ref 0.7–3.1)
Lymphs: 31 %
MCH: 28.1 pg (ref 26.6–33.0)
MCHC: 32.7 g/dL (ref 31.5–35.7)
MCV: 86 fL (ref 79–97)
Monocytes Absolute: 0.6 10*3/uL (ref 0.1–0.9)
Monocytes: 8 %
Neutrophils Absolute: 4.3 10*3/uL (ref 1.4–7.0)
Neutrophils: 57 %
Platelets: 248 10*3/uL (ref 150–450)
RBC: 5.27 x10E6/uL (ref 4.14–5.80)
RDW: 12.8 % (ref 12.3–15.4)
WBC: 7.6 10*3/uL (ref 3.4–10.8)

## 2018-06-07 LAB — COMPREHENSIVE METABOLIC PANEL
ALT: 27 IU/L (ref 0–44)
AST: 24 IU/L (ref 0–40)
Albumin/Globulin Ratio: 1.2 (ref 1.2–2.2)
Albumin: 3.8 g/dL (ref 3.6–4.8)
Alkaline Phosphatase: 37 IU/L — ABNORMAL LOW (ref 39–117)
BUN/Creatinine Ratio: 19 (ref 10–24)
BUN: 20 mg/dL (ref 8–27)
Bilirubin Total: 0.3 mg/dL (ref 0.0–1.2)
CO2: 26 mmol/L (ref 20–29)
Calcium: 9.6 mg/dL (ref 8.6–10.2)
Chloride: 99 mmol/L (ref 96–106)
Creatinine, Ser: 1.06 mg/dL (ref 0.76–1.27)
GFR calc Af Amer: 82 mL/min/{1.73_m2} (ref >59)
GFR calc non Af Amer: 71 mL/min/{1.73_m2} (ref >59)
Globulin, Total: 3.2 g/dL (ref 1.5–4.5)
Glucose: 110 mg/dL — ABNORMAL HIGH (ref 65–99)
Potassium: 4.3 mmol/L (ref 3.5–5.2)
Sodium: 140 mmol/L (ref 134–144)
Total Protein: 7 g/dL (ref 6.0–8.5)

## 2018-06-07 LAB — TSH: TSH: 3.73 u[IU]/mL (ref 0.450–4.500)

## 2018-06-07 LAB — C-REACTIVE PROTEIN: CRP: 6 mg/L (ref 0–10)

## 2018-06-09 ENCOUNTER — Ambulatory Visit (INDEPENDENT_AMBULATORY_CARE_PROVIDER_SITE_OTHER): Payer: Medicare Other | Admitting: Family Medicine

## 2018-06-09 ENCOUNTER — Ambulatory Visit: Payer: Medicare Other | Admitting: Family Medicine

## 2018-06-09 ENCOUNTER — Encounter: Payer: Self-pay | Admitting: Family Medicine

## 2018-06-09 ENCOUNTER — Other Ambulatory Visit: Payer: Self-pay | Admitting: Family Medicine

## 2018-06-09 VITALS — BP 134/80 | HR 59 | Temp 97.6°F | Ht 68.0 in | Wt 372.4 lb

## 2018-06-09 DIAGNOSIS — G894 Chronic pain syndrome: Secondary | ICD-10-CM

## 2018-06-09 DIAGNOSIS — Z136 Encounter for screening for cardiovascular disorders: Secondary | ICD-10-CM

## 2018-06-09 DIAGNOSIS — N529 Male erectile dysfunction, unspecified: Secondary | ICD-10-CM | POA: Diagnosis not present

## 2018-06-09 MED ORDER — GABAPENTIN 400 MG PO CAPS: 400.0000 mg | ORAL_CAPSULE | Freq: Three times a day (TID) | ORAL | 5 refills | Status: DC

## 2018-06-09 MED ORDER — SILDENAFIL CITRATE 20 MG PO TABS: ORAL_TABLET | ORAL | 2 refills | Status: DC

## 2018-06-09 NOTE — Telephone Encounter (Signed)
[facility] is requesting pt sildenafil to be filled . Please advise. [facility]

## 2018-06-09 NOTE — Progress Notes (Signed)
Aaron SandiferJames E Castro is a 67 y.o. male who presents for annual wellness visit and follow-up on chronic medical conditions.  He has the following concerns:   Immunizations and Health Maintenance Immunization History  Administered Date(s) Administered  . DT 06/25/2005  . Influenza Split 03/04/2012  . Influenza, High Dose Seasonal PF 05/28/2016, 06/06/2018  . Influenza,inj,Quad PF,6+ Mos 04/12/2014  . Pneumococcal Conjugate-13 10/26/2013  . Pneumococcal Polysaccharide-23 12/01/2008, 04/12/2014  . Tdap 04/10/2014  . Zoster 01/22/2014   There are no preventive care reminders to display for this patient.  Last colonoscopy:2018 Last PSA:N/A Dentist: [facility] Ophtho:no Exercise:none  Other doctors caring for patient include:None  Advanced Directives:Yes , copy asked for    Depression screen:  See questionnaire below.     Depression screen [facility] 2[facility] 06/09/2018 06/05/2017 01/18/2016 09/10/2014  Decreased Interest 0 0 0 0  Down, Depressed, Hopeless 0 0 0 0  PHQ - 2 Score 0 0 0 0    Fall Screen: See Questionaire below.   Fall Risk  06/09/2018 06/05/2017 01/18/2016 09/10/2014  Falls in the past year? 0 No No No    ADL screen:  See questionnaire below.  Functional Status Survey: Is the patient deaf or have difficulty hearing?: Yes(people say he does) Does the patient have difficulty seeing, even when wearing glasses/contacts?: No Does the patient have difficulty concentrating, remembering, or making decisions?: Yes(short term memory) Does the patient have difficulty walking or climbing stairs?: No Does the patient have difficulty dressing or bathing?: No Does the patient have difficulty doing errands alone such as visiting a doctor's office or shopping?: No   Review of Systems  Constitutional: -, -unexpected weight change, -anorexia, -fatigue Allergy: -sneezing, -itching, -congestion Dermatology: denies changing moles, rash, lumps ENT: -runny nose, -ear pain, -sore throat,  Cardiology:   -chest pain, -palpitations, -orthopnea, Respiratory: -cough, -shortness of breath, -dyspnea on exertion, -wheezing,  Gastroenterology: -abdominal pain, -nausea, -vomiting, -diarrhea, -constipation, -dysphagia Hematology: -bleeding or bruising problems Musculoskeletal: -arthralgias, -myalgias, -joint swelling, -back pain, - Ophthalmology: -vision changes,  Urology: -dysuria, -difficulty urinating,  -urinary frequency, -urgency, incontinence Neurology: -, -numbness, , -memory loss, -falls, -dizziness    PHYSICAL EXAM:   General Appearance: Alert, cooperative, no distress, appears stated age  ASSESSMENT/PLAN: Screening for AAA (abdominal aortic aneurysm) - Plan: US Aorta  Erectile dysfunction, unspecified erectile dysfunction type - Plan: sildenafil (REVATIO) 20 MG tablet  Chronic pain syndrome - Plan: gabapentin (NEURONTIN) 400 MG capsule I will increase his gabapentin to 400 3 times daily to see if that will help with his chronic pain.  He would also like a refill on his sildenafil which I will do.  He apparently has a new girlfriend.  Encouraged him to be as active as he possibly can. recommended at least 30 minutes of aerobic activity at least 5 days/week;  healthy diet and alcohol recommendations (less than or equal to 2 drinks/day) reviewed;  Immunization recommendations discussed.  Colonoscopy recommendations reviewed.   Medicare Attestation I have personally reviewed: The patient's medical and social history Their use of alcohol, tobacco or illicit drugs Their current medications and supplements The patient's functional ability including ADLs,fall risks, home safety risks, cognitive, and hearing and visual impairment Diet and physical activities Evidence for depression or mood disorders  The patient's weight, height, and BMI have been recorded in the chart.  I have made referrals, counseling, and provided education to the patient based on review of the above and I have  provided the patient with a written personalized care plan  for preventive services.     Sharlot Gowda, MD   06/09/2018

## 2018-06-12 ENCOUNTER — Other Ambulatory Visit: Payer: Self-pay | Admitting: Family Medicine

## 2018-06-12 DIAGNOSIS — Z136 Encounter for screening for cardiovascular disorders: Secondary | ICD-10-CM

## 2018-06-17 ENCOUNTER — Ambulatory Visit
Admission: RE | Admit: 2018-06-17 | Discharge: 2018-06-17 | Disposition: A | Discharge status: Home / Self Care | Payer: Medicare Other | Source: Ambulatory Visit | Attending: Family Medicine | Admitting: Family Medicine

## 2018-06-17 IMAGING — US US ABDOMINAL AORTA SCREENING AAA
1 series · 8 of 8 positions shown · non-contrast
Comparison: None.

CLINICAL DATA: 67-year-old male, 1st time exam with history of
smoking.

EXAM:
US ABDOMINAL AORTA MEDICARE SCREENING
TECHNIQUE: Ultrasound examination of the abdominal aorta was performed as a
screening evaluation for abdominal aortic aneurysm.

[Series 1: us abdominal aorta screening aaa · 0.49mm/px · 8 of 8 slices shown]
[im 1/8]
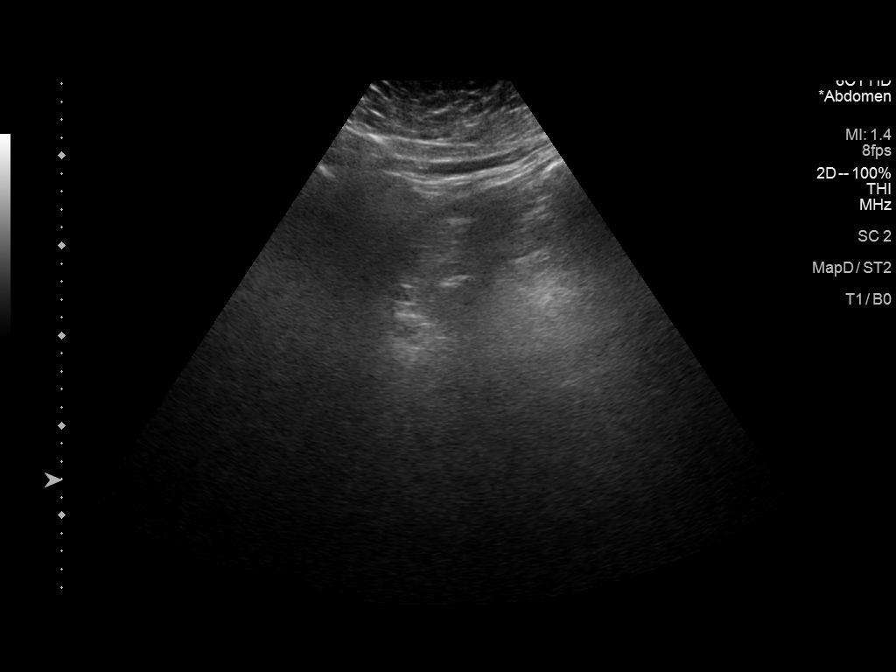
[im 2/8]
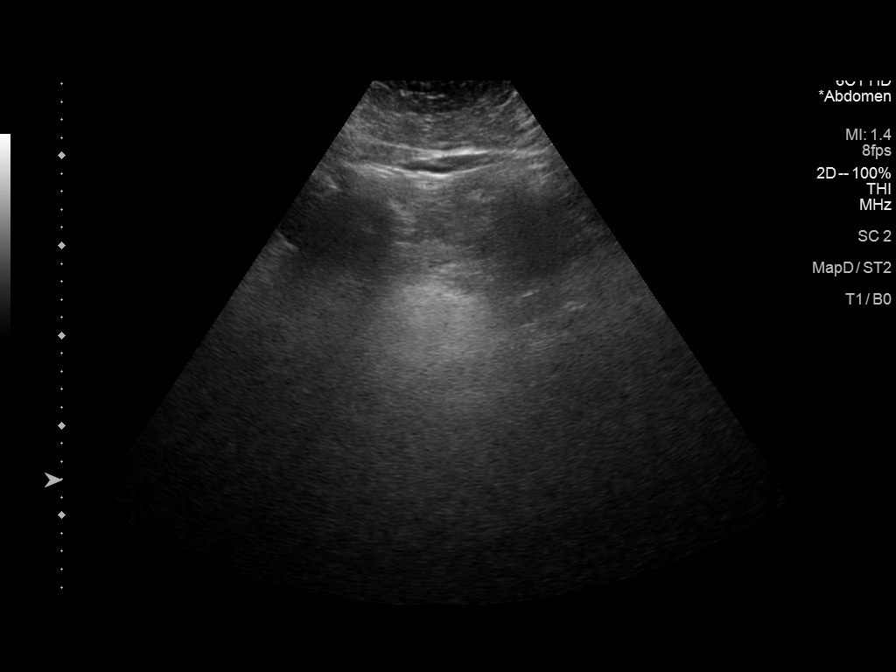
[im 3/8]
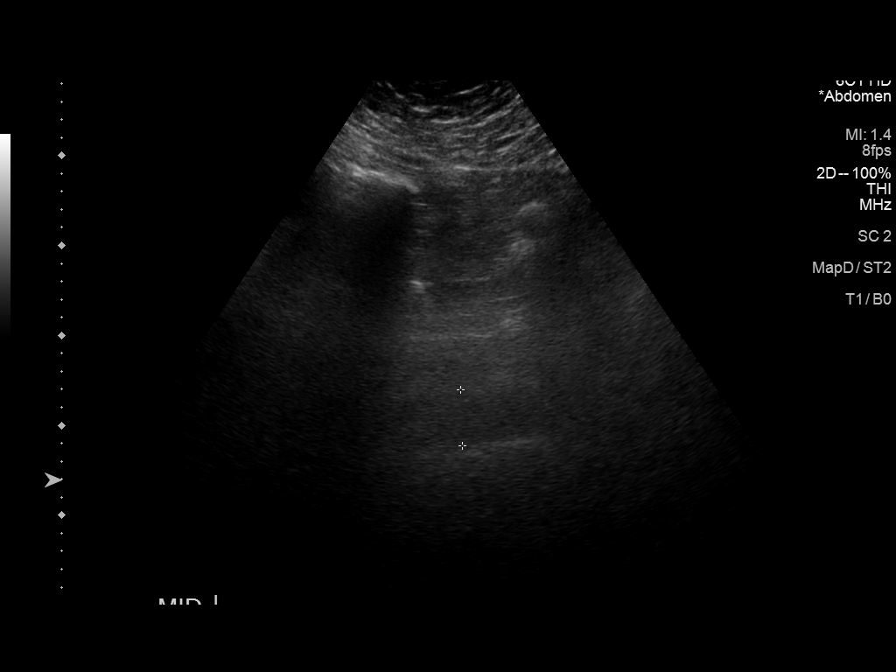
[im 4/8]
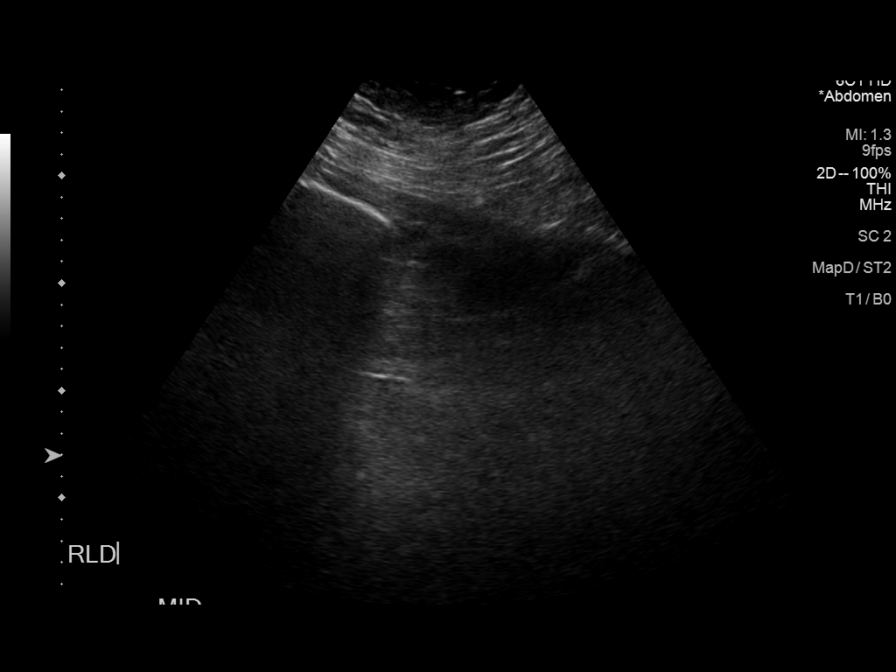
[im 5/8]
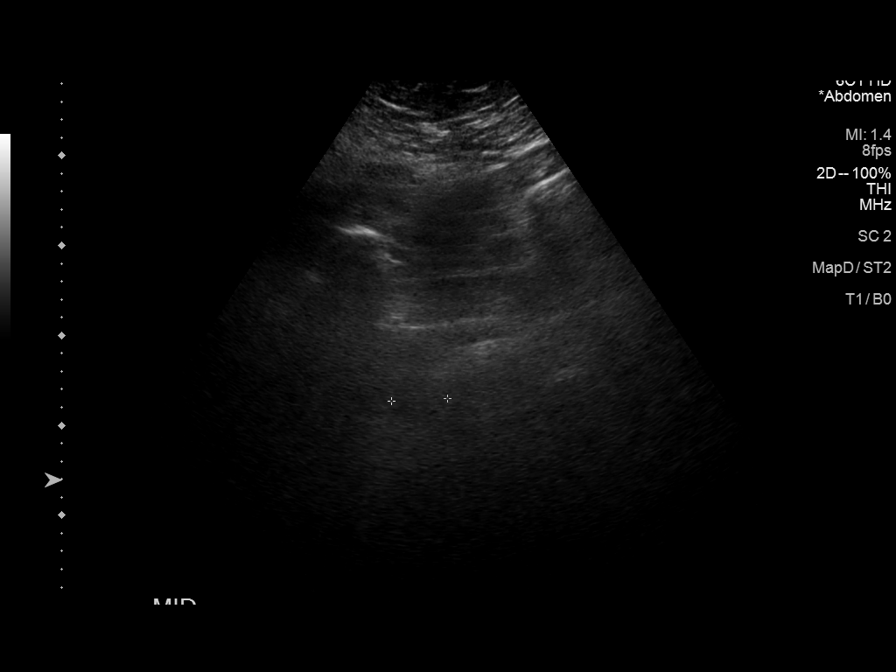
[im 6/8]
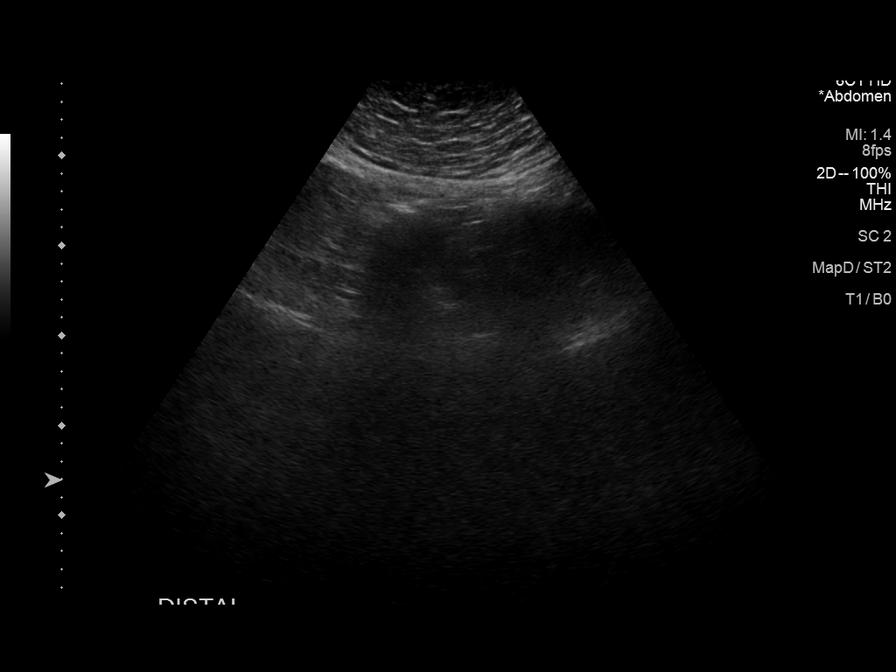
[im 7/8]
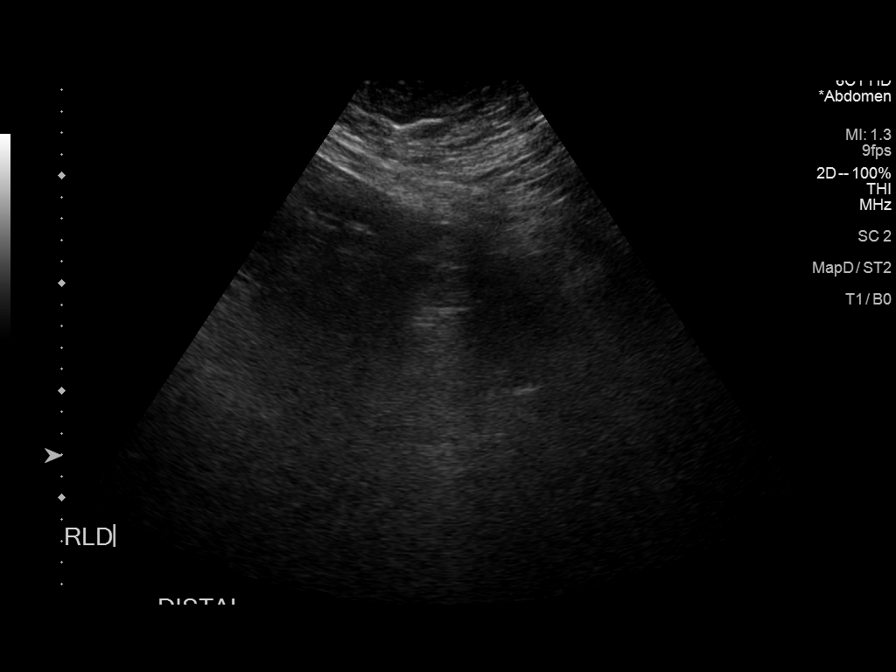
[im 8/8]
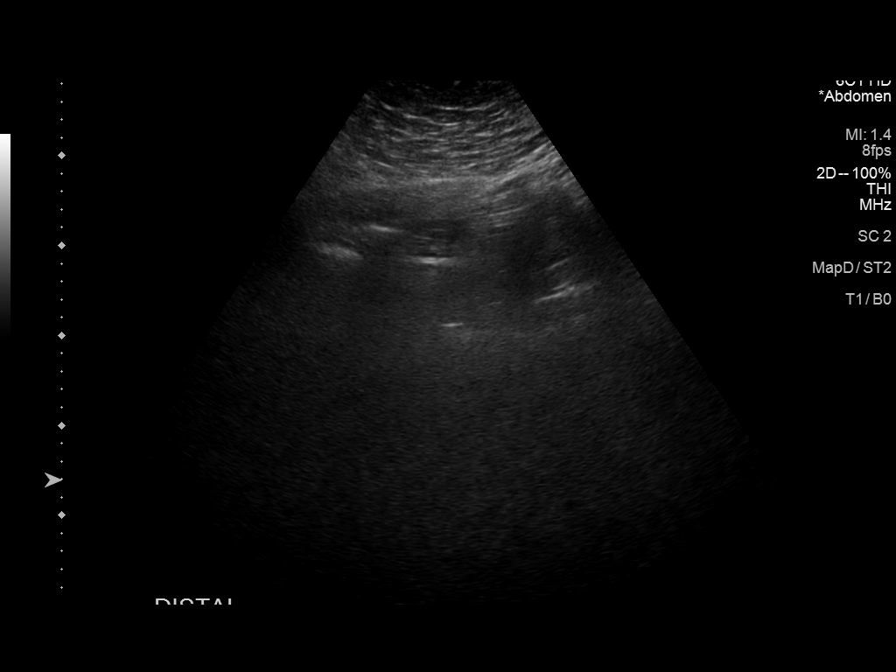

[8 of 8 positions shown; findings below may reference images not displayed]

FINDINGS: Abdominal aortic measurements as follows:

Proximal:  Obscured

Mid:  Difficult to visualize, approximately 3.1 cm

Distal:  Obscured
IMPRESSION: Limited study due to body habitus and bowel gas. Only a small amount
of the mid abdominal aorta can be visualized which measures
maximally 3.1 cm. Recommend followup by ultrasound in 3 years. This
recommendation follows ACR consensus guidelines: White Paper of the
ACR Incidental Findings Committee II on Vascular Findings. [HOSPITAL] [KM]; [DATE]

## 2018-06-18 ENCOUNTER — Other Ambulatory Visit: Payer: Self-pay | Admitting: Family Medicine

## 2018-06-18 DIAGNOSIS — N401 Enlarged prostate with lower urinary tract symptoms: Secondary | ICD-10-CM

## 2018-06-18 DIAGNOSIS — I10 Essential (primary) hypertension: Secondary | ICD-10-CM

## 2018-06-18 DIAGNOSIS — R3911 Hesitancy of micturition: Principal | ICD-10-CM

## 2018-07-10 ENCOUNTER — Ambulatory Visit (INDEPENDENT_AMBULATORY_CARE_PROVIDER_SITE_OTHER): Payer: Medicare Other | Admitting: Family Medicine

## 2018-07-10 ENCOUNTER — Encounter: Payer: Self-pay | Admitting: Family Medicine

## 2018-07-10 VITALS — BP 120/76 | HR 51 | Temp 98.0°F | Wt 373.0 lb

## 2018-07-10 DIAGNOSIS — G894 Chronic pain syndrome: Secondary | ICD-10-CM

## 2018-07-10 MED ORDER — GABAPENTIN 600 MG PO TABS: 600.0000 mg | ORAL_TABLET | Freq: Three times a day (TID) | ORAL | 1 refills | Status: DC

## 2018-07-10 NOTE — Patient Instructions (Signed)
Use of your present gabapentin and then start taking the higher dose.  Then give me a call.

## 2018-07-10 NOTE — Progress Notes (Signed)
   Subjective:    Patient ID: Aaron Castro, male    DOB: 03-08-1951, 68 y.o.   MRN: [id number]  HPI He is here for recheck concerning his neuropathy.  He is been taking 400 mg 3 times daily of Neurontin and states that it has made a an appreciable difference in his pain.  He stated that the pain was at a 10 out of 10 and now goes to a 7 out of 10.    Review of Systems     Objective:   Physical Exam Alert and in no distress otherwise not examined       Assessment & Plan:  Chronic pain syndrome - Plan: gabapentin (NEURONTIN) 600 MG tablet He is to finish out the 400 3 times daily of the Neurontin then increase to 600.  He will call me towards the end of taking the 600 3 times daily and let me know how he is doing.  May need to increase it even further.

## 2018-07-15 ENCOUNTER — Other Ambulatory Visit: Payer: Self-pay | Admitting: Family Medicine

## 2018-07-15 DIAGNOSIS — R3911 Hesitancy of micturition: Principal | ICD-10-CM

## 2018-07-15 DIAGNOSIS — N401 Enlarged prostate with lower urinary tract symptoms: Secondary | ICD-10-CM

## 2018-07-25 ENCOUNTER — Other Ambulatory Visit: Payer: Self-pay | Admitting: Family Medicine

## 2018-07-27 ENCOUNTER — Emergency Department (HOSPITAL_COMMUNITY)
Admission: EM | Admit: 2018-07-27 | Discharge: 2018-07-27 | Disposition: A | Discharge status: Home / Self Care | Payer: Medicare Other | Attending: Emergency Medicine | Admitting: Emergency Medicine

## 2018-07-27 DIAGNOSIS — I10 Essential (primary) hypertension: Secondary | ICD-10-CM | POA: Insufficient documentation

## 2018-07-27 DIAGNOSIS — M25511 Pain in right shoulder: Secondary | ICD-10-CM | POA: Diagnosis not present

## 2018-07-27 DIAGNOSIS — Z87891 Personal history of nicotine dependence: Secondary | ICD-10-CM | POA: Insufficient documentation

## 2018-07-27 DIAGNOSIS — Z79899 Other long term (current) drug therapy: Secondary | ICD-10-CM | POA: Diagnosis not present

## 2018-07-27 MED ORDER — TRAMADOL HCL 50 MG PO TABS: 50.0000 mg | ORAL_TABLET | Freq: Four times a day (QID) | ORAL | 0 refills | Status: DC | PRN

## 2018-07-27 NOTE — ED Triage Notes (Signed)
Pt reports R shoulder pain, chronic in nature. States he  was seen at [facility] sent earlier today and was told to continue with his Gabapentin, Robaxin, and Tramadol but states the pain has continued.

## 2018-07-27 NOTE — ED Notes (Signed)
Pt reports right shoulder muscle spasms.
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
## 2018-07-27 NOTE — ED Notes (Signed)
Patient verbalizes understanding of discharge instructions. Opportunity for questioning and answers were provided. Armband removed by staff, pt discharged from ED home via POV.  

## 2018-07-27 NOTE — Discharge Instructions (Signed)
Your history and exam today are consistent with acute on chronic right shoulder pain that worsened over the last 2 days.  As you are unable to get your medication over the weekend, please fill the prescription to get you to your doctor's appointment.  If any symptoms change or worsen, please return to the nearest emergency department.

## 2018-07-28 NOTE — Telephone Encounter (Signed)
[facility] is requesting to fill pt tramadol. Please advise [facility] 

## 2018-08-09 ENCOUNTER — Other Ambulatory Visit: Payer: Self-pay | Admitting: Family Medicine

## 2018-08-09 DIAGNOSIS — N401 Enlarged prostate with lower urinary tract symptoms: Secondary | ICD-10-CM

## 2018-08-09 DIAGNOSIS — R3911 Hesitancy of micturition: Principal | ICD-10-CM

## 2018-08-13 ENCOUNTER — Other Ambulatory Visit: Payer: Self-pay

## 2018-08-13 DIAGNOSIS — N529 Male erectile dysfunction, unspecified: Secondary | ICD-10-CM

## 2018-08-13 MED ORDER — SILDENAFIL CITRATE 20 MG PO TABS: ORAL_TABLET | ORAL | 5 refills | Status: DC

## 2018-08-13 NOTE — Telephone Encounter (Signed)
Patient came in and requested that Sildenafil be sent to [facility] on [address].

## 2018-08-19 ENCOUNTER — Ambulatory Visit (INDEPENDENT_AMBULATORY_CARE_PROVIDER_SITE_OTHER): Payer: Medicare Other | Admitting: Family Medicine

## 2018-08-19 VITALS — BP 146/86 | HR 77 | Temp 97.8°F | Wt 371.6 lb

## 2018-08-19 DIAGNOSIS — G894 Chronic pain syndrome: Secondary | ICD-10-CM

## 2018-08-19 DIAGNOSIS — N529 Male erectile dysfunction, unspecified: Secondary | ICD-10-CM | POA: Diagnosis not present

## 2018-08-19 MED ORDER — GABAPENTIN 600 MG PO TABS: 600.0000 mg | ORAL_TABLET | Freq: Three times a day (TID) | ORAL | 3 refills | Status: DC

## 2018-08-19 NOTE — Progress Notes (Signed)
   Subjective:    Patient ID: Aaron Castro, male    DOB: Oct 25, 1950, 68 y.o.   MRN: [id number]  HPI He is here for a recheck.  He states that the 600 3 times daily of Neurontin is working very well and he would like to stay on that.  He also tried the sildenafil and found that 3 pills worked adequately for him.   Review of Systems     Objective:   Physical Exam Alert and in no distress otherwise not examined       Assessment & Plan:  Erectile dysfunction, unspecified erectile dysfunction type  Chronic pain syndrome - Plan: gabapentin (NEURONTIN) 600 MG tablet I will give him a 90-day supply of the Neurontin. Also suggested to use the sildenafil on an as-needed basis rather than assuming he is going to need it every time he has sexual activity.

## 2018-09-08 ENCOUNTER — Telehealth: Payer: Self-pay | Admitting: Family Medicine

## 2018-09-08 NOTE — Telephone Encounter (Signed)
Let him know that I cannot call that much medicine in under the circumstances.
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
## 2018-09-09 ENCOUNTER — Telehealth: Payer: Self-pay | Admitting: Family Medicine
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
## 2018-09-09 NOTE — Telephone Encounter (Signed)
He needs to take his Voltaren twice per day and you can also use Tylenol 2 tablets 4 times per day.  That is all we can do at the present time

## 2018-09-09 NOTE — Telephone Encounter (Signed)
Done KH 

## 2018-09-09 NOTE — Telephone Encounter (Signed)
Pt was advised Aaron Castro 

## 2018-09-10 ENCOUNTER — Other Ambulatory Visit: Payer: Self-pay | Admitting: Family Medicine

## 2018-09-10 DIAGNOSIS — N401 Enlarged prostate with lower urinary tract symptoms: Secondary | ICD-10-CM

## 2018-09-10 DIAGNOSIS — R3911 Hesitancy of micturition: Principal | ICD-10-CM

## 2018-10-08 ENCOUNTER — Other Ambulatory Visit: Payer: Self-pay | Admitting: Family Medicine

## 2018-10-08 DIAGNOSIS — R3911 Hesitancy of micturition: Principal | ICD-10-CM

## 2018-10-08 DIAGNOSIS — N401 Enlarged prostate with lower urinary tract symptoms: Secondary | ICD-10-CM

## 2018-10-14 ENCOUNTER — Emergency Department (HOSPITAL_COMMUNITY): Payer: Medicare Other

## 2018-10-14 ENCOUNTER — Other Ambulatory Visit: Payer: Self-pay

## 2018-10-14 ENCOUNTER — Emergency Department (HOSPITAL_COMMUNITY)
Admission: EM | Admit: 2018-10-14 | Discharge: 2018-10-14 | Disposition: A | Discharge status: Home / Self Care | Payer: Medicare Other | Attending: Emergency Medicine | Admitting: Emergency Medicine

## 2018-10-14 ENCOUNTER — Encounter: Payer: Self-pay | Admitting: Family Medicine

## 2018-10-14 ENCOUNTER — Encounter (HOSPITAL_COMMUNITY): Payer: Self-pay | Admitting: Emergency Medicine

## 2018-10-14 ENCOUNTER — Ambulatory Visit: Payer: Medicare Other | Admitting: Family Medicine

## 2018-10-14 VITALS — Wt 370.0 lb

## 2018-10-14 DIAGNOSIS — I10 Essential (primary) hypertension: Secondary | ICD-10-CM | POA: Insufficient documentation

## 2018-10-14 DIAGNOSIS — G6289 Other specified polyneuropathies: Secondary | ICD-10-CM | POA: Diagnosis not present

## 2018-10-14 DIAGNOSIS — Z87891 Personal history of nicotine dependence: Secondary | ICD-10-CM | POA: Diagnosis not present

## 2018-10-14 DIAGNOSIS — M79606 Pain in leg, unspecified: Secondary | ICD-10-CM | POA: Diagnosis present

## 2018-10-14 DIAGNOSIS — Z87820 Personal history of traumatic brain injury: Secondary | ICD-10-CM | POA: Diagnosis not present

## 2018-10-14 DIAGNOSIS — M5136 Other intervertebral disc degeneration, lumbar region: Secondary | ICD-10-CM | POA: Diagnosis not present

## 2018-10-14 DIAGNOSIS — Z79899 Other long term (current) drug therapy: Secondary | ICD-10-CM | POA: Insufficient documentation

## 2018-10-14 DIAGNOSIS — M48061 Spinal stenosis, lumbar region without neurogenic claudication: Secondary | ICD-10-CM | POA: Diagnosis not present

## 2018-10-14 DIAGNOSIS — G629 Polyneuropathy, unspecified: Secondary | ICD-10-CM | POA: Diagnosis not present

## 2018-10-14 DIAGNOSIS — M47816 Spondylosis without myelopathy or radiculopathy, lumbar region: Secondary | ICD-10-CM | POA: Diagnosis not present

## 2018-10-14 LAB — CBC WITH DIFFERENTIAL/PLATELET
Abs Immature Granulocytes: 0.03 10*3/uL (ref 0.00–0.07)
Basophils Absolute: 0 10*3/uL (ref 0.0–0.1)
Basophils Relative: 0 %
Eosinophils Absolute: 0.2 10*3/uL (ref 0.0–0.5)
Eosinophils Relative: 2 %
HCT: 48.4 % (ref 39.0–52.0)
Hemoglobin: 15.1 g/dL (ref 13.0–17.0)
Immature Granulocytes: 0 %
Lymphocytes Relative: 39 %
Lymphs Abs: 3.5 10*3/uL (ref 0.7–4.0)
MCH: 27 pg (ref 26.0–34.0)
MCHC: 31.2 g/dL (ref 30.0–36.0)
MCV: 86.4 fL (ref 80.0–100.0)
Monocytes Absolute: 0.8 10*3/uL (ref 0.1–1.0)
Monocytes Relative: 9 %
Neutro Abs: 4.4 10*3/uL (ref 1.7–7.7)
Neutrophils Relative %: 50 %
Platelets: 244 10*3/uL (ref 150–400)
RBC: 5.6 MIL/uL (ref 4.22–5.81)
RDW: 12.9 % (ref 11.5–15.5)
WBC: 8.9 10*3/uL (ref 4.0–10.5)
nRBC: 0 % (ref 0.0–0.2)

## 2018-10-14 LAB — COMPREHENSIVE METABOLIC PANEL
ALT: 29 U/L (ref 0–44)
AST: 30 U/L (ref 15–41)
Albumin: 3.6 g/dL (ref 3.5–5.0)
Alkaline Phosphatase: 37 U/L — ABNORMAL LOW (ref 38–126)
Anion gap: 10 (ref 5–15)
BUN: 16 mg/dL (ref 8–23)
CO2: 26 mmol/L (ref 22–32)
Calcium: 9.5 mg/dL (ref 8.9–10.3)
Chloride: 102 mmol/L (ref 98–111)
Creatinine, Ser: 0.98 mg/dL (ref 0.61–1.24)
GFR calc Af Amer: >60 mL/min (ref >60)
GFR calc non Af Amer: >60 mL/min (ref >60)
Glucose, Bld: 99 mg/dL (ref 70–99)
Potassium: 3.7 mmol/L (ref 3.5–5.1)
Sodium: 138 mmol/L (ref 135–145)
Total Bilirubin: 0.8 mg/dL (ref 0.3–1.2)
Total Protein: 7.7 g/dL (ref 6.5–8.1)

## 2018-10-14 LAB — URINALYSIS, ROUTINE W REFLEX MICROSCOPIC
Bilirubin Urine: NEGATIVE
Glucose, UA: NEGATIVE mg/dL
Hgb urine dipstick: NEGATIVE
Ketones, ur: NEGATIVE mg/dL
Leukocytes,Ua: NEGATIVE
Nitrite: NEGATIVE
Protein, ur: NEGATIVE mg/dL
Specific Gravity, Urine: 1.017 (ref 1.005–1.030)
pH: 5 (ref 5.0–8.0)

## 2018-10-14 LAB — CBG MONITORING, ED: Glucose-Capillary: 102 mg/dL — ABNORMAL HIGH (ref 70–99)

## 2018-10-14 IMAGING — MR MRI LUMBAR SPINE WITHOUT CONTRAST
4 of 5 series · 25 of 48 positions shown · non-contrast
Comparison: Lumbar spine radiographs [DATE]

CLINICAL DATA: Worsening bilateral leg weakness.

EXAM:
MRI LUMBAR SPINE WITHOUT CONTRAST
TECHNIQUE: Multiplanar, multisequence MR imaging of the lumbar spine was
performed. No intravenous contrast was administered.

[Series 5: T2 · sagittal · 4.0mm · 0.73mm/px · 6 of 16 slices shown (1 of 2)]
[im 1/16]
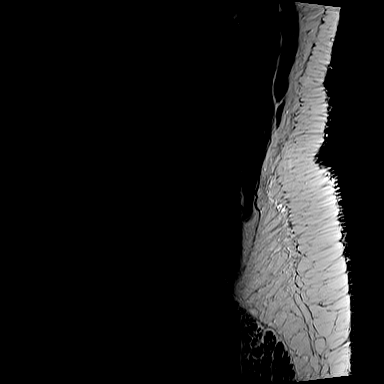
[im 4/16]
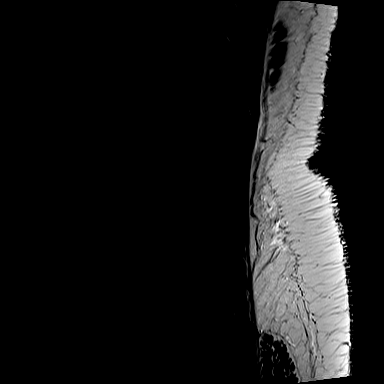
[im 7/16]
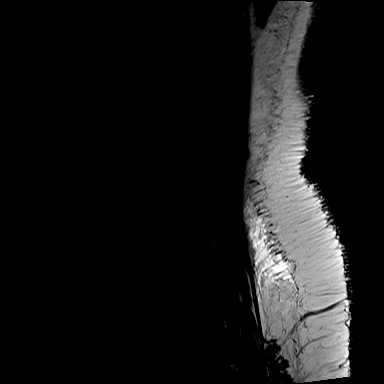
[im 10/16]
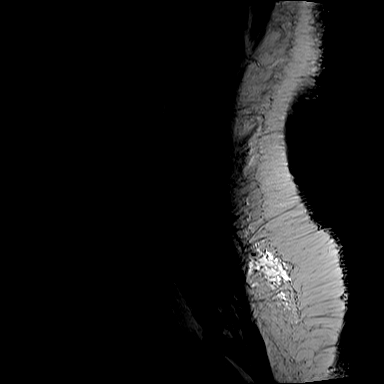
[im 13/16]
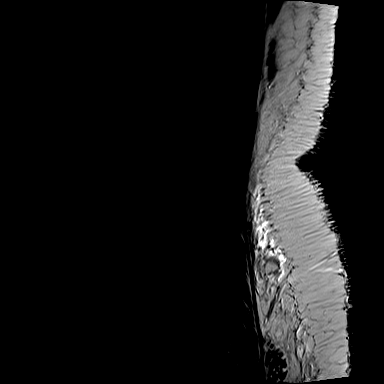
[im 16/16]
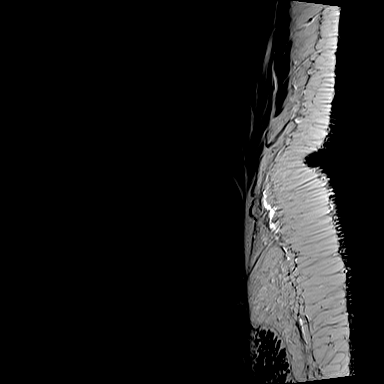

[Series 7: T1 · sagittal · 4.0mm · 0.88mm/px · 6 of 16 slices shown (1 of 2)]
[im 1/16]
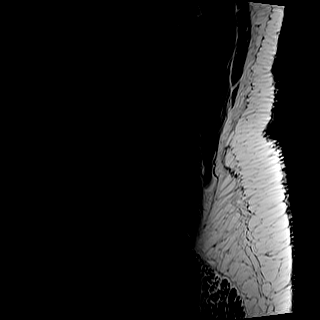
[im 4/16]
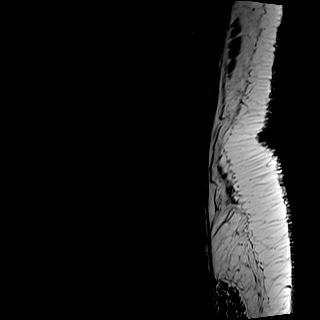
[im 7/16]
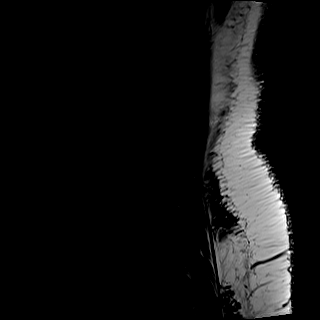
[im 10/16]
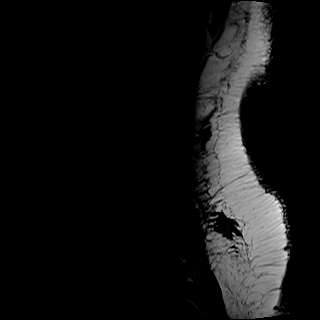
[im 13/16]
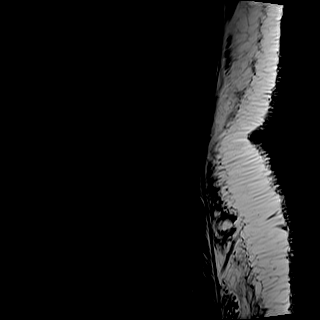
[im 16/16]
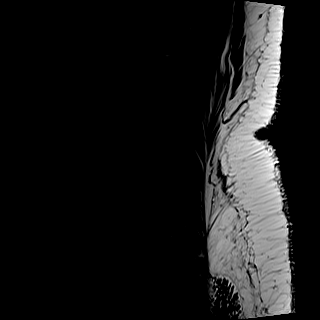

[Series 8: T2 · axial · 4.0mm · 0.57mm/px · z∈[-166,+54]mm · 9 of 37 slices shown (2 of 2)]
[im 1/37]
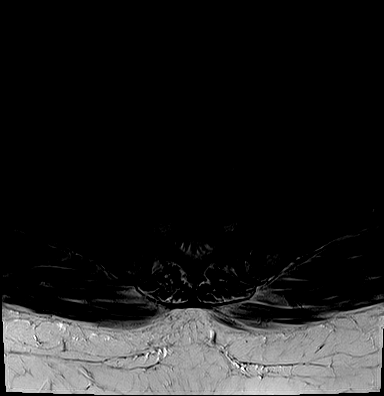
[im 6/37]
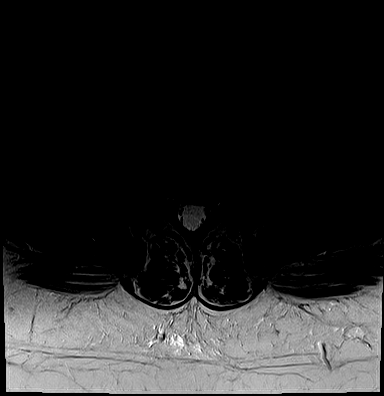
[im 11/37]
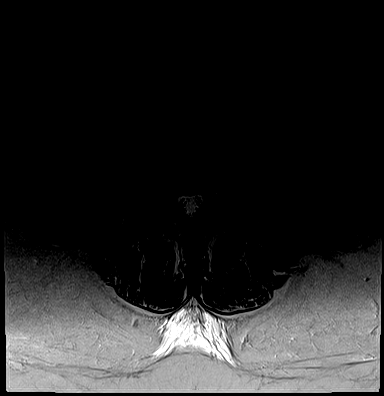
[im 16/37]
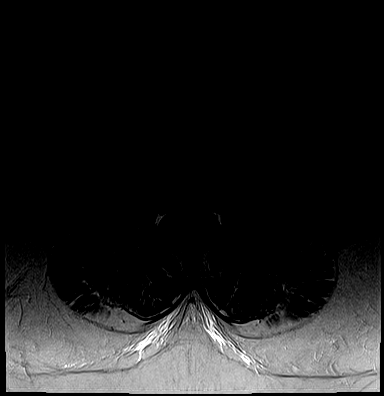
[im 19/37]
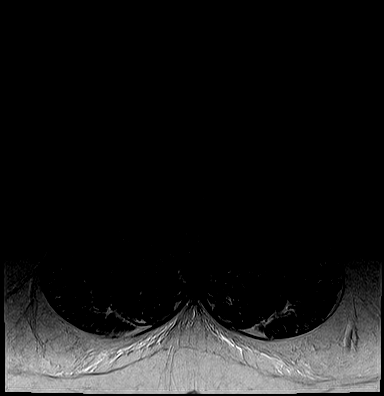
[im 21/37]
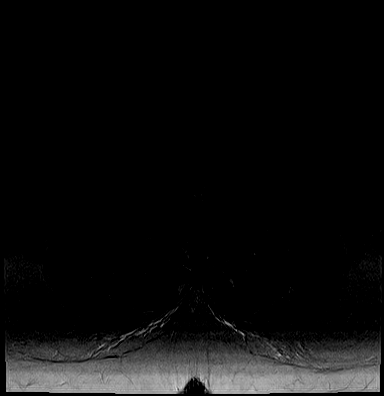
[im 26/37]
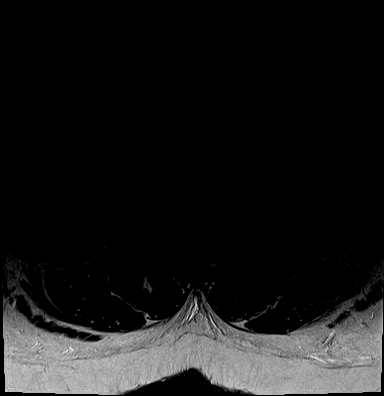
[im 31/37]
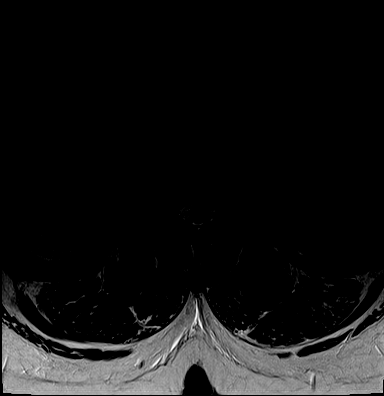
[im 37/37]
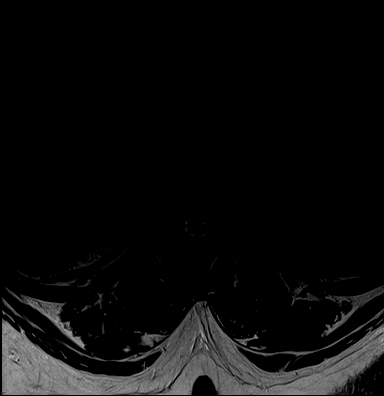

[Series 9: T1 · axial · 4.0mm · 0.34mm/px · z∈[-165,+24]mm · 4 of 37 slices shown (2 of 2)]
[im 1/37]
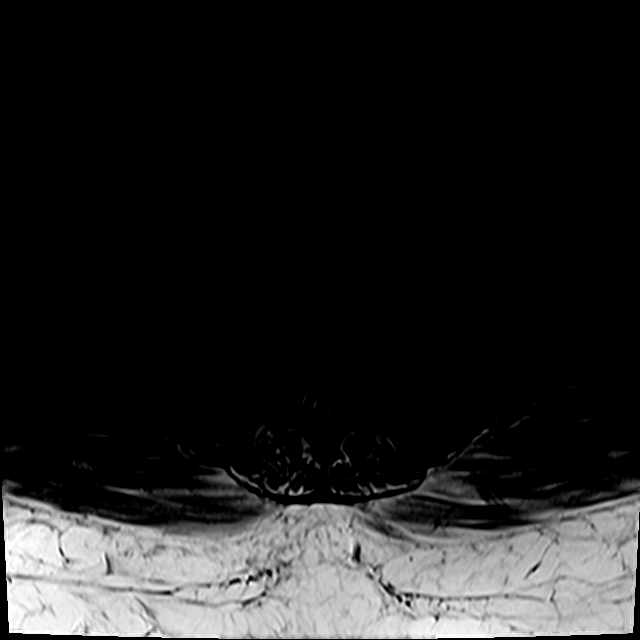
[im 6/37]
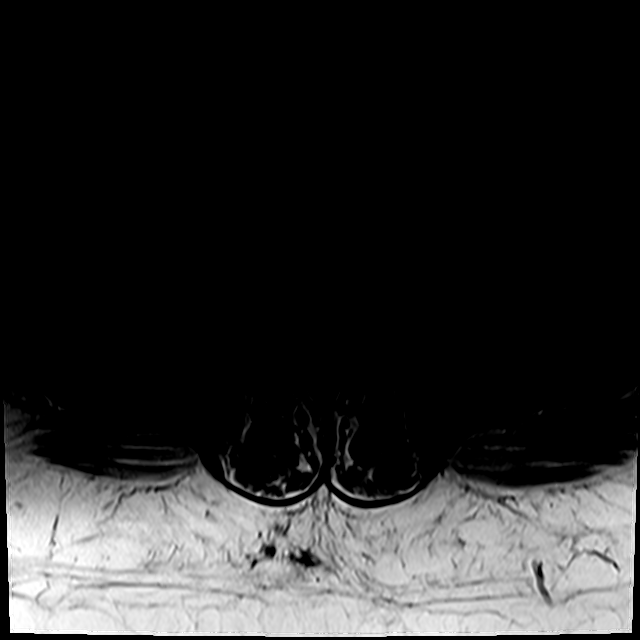
[im 19/37]
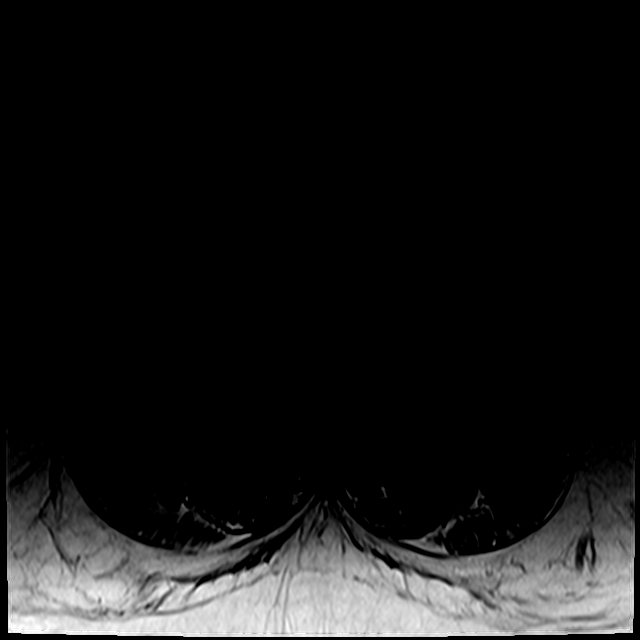
[im 31/37]
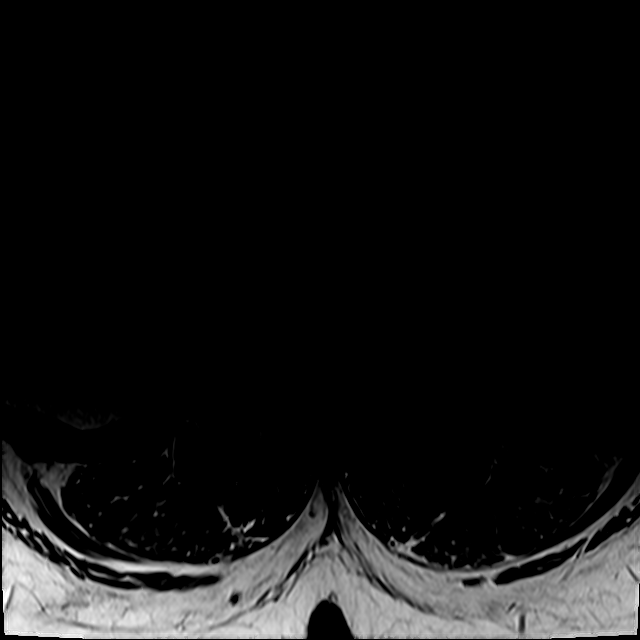

[25 of 48 positions shown; findings below may reference images not displayed]

FINDINGS: Image quality is mildly degraded by motion and patient body habitus.

Segmentation: Standard.

Alignment:  Normal.

Vertebrae: No fracture, suspicious osseous lesion, or significant
marrow edema. Moderate chronic degenerative endplate marrow changes
at L4-5 and L5-S1.

Conus medullaris and cauda equina: Conus extends to the L2 level and
appears normal. There is cauda equina nerve root redundancy related
to spinal stenosis.

Paraspinal and other soft tissues: Partially visualized 2.4 cm T2
hyperintense lesion in the upper pole of the left kidney, likely a
cyst but incompletely evaluated. Subcentimeter T2 hyperintensity in
the right kidney.

Disc levels:

Disc desiccation and disc space narrowing throughout the lumbar and
included lower thoracic spine with exception of L2-3. Disc space
narrowing is severe at L4-5 and moderate at L5-S1. The spinal canal
is diffusely small in caliber due to congenitally short pedicles.

T10-11: Only imaged sagittally. Disc bulging, congenitally short
pedicles, and mild facet hypertrophy result in mild spinal stenosis
and mild bilateral neural foraminal stenosis.

T11-12: Only imaged sagittally. Disc bulging, congenitally short
pedicles, and mild to moderate facet hypertrophy result in
mild-to-moderate spinal stenosis and mild-to-moderate bilateral
neural foraminal stenosis.

T12-L1: Mild disc bulging, small left paracentral disc protrusion,
and mild facet and ligamentum flavum hypertrophy without significant
stenosis.

L1-2: Moderate-sized central to right subarticular disc extrusion,
mild disc bulging, congenitally short pedicles, and mild facet and
ligamentum flavum result in mild spinal stenosis and mild right
lateral recess stenosis. No significant neural foraminal stenosis.

L2-3: Mild facet and ligamentum flavum hypertrophy without disc
herniation or stenosis.

L3-4: Disc bulging, broad central disc protrusion, congenitally
short pedicles, and moderate facet and ligamentum flavum hypertrophy
result in moderate to severe spinal stenosis and moderate to severe
bilateral neural foraminal stenosis. There are bilateral facet joint
effusions.

L4-5: Circumferential disc osteophyte complex, disc space narrowing,
and moderate facet and ligamentum flavum hypertrophy result in mild
spinal stenosis, mild-to-moderate bilateral lateral recess stenosis,
and moderate to severe bilateral neural foraminal stenosis.

L5-S1: Circumferential disc osteophyte complex, disc space
narrowing, and mild facet hypertrophy result in moderate right and
severe left neural foraminal stenosis without spinal stenosis.
IMPRESSION: 1. Congenitally short pedicles with diffuse disc and facet
degeneration.
2. Moderate to severe spinal stenosis and bilateral neural foraminal
stenosis at L3-4.
3. Moderate to severe neural foraminal stenosis at L4-5 and L5-S1.
4. Mild spinal stenosis and right lateral recess stenosis at L1-2
related to a disc extrusion.

## 2018-10-14 NOTE — ED Notes (Signed)
Patient verbalizes understanding of discharge instructions. Opportunity for questioning and answers were provided. Armband removed by staff, pt discharged from ED.  

## 2018-10-14 NOTE — Discharge Instructions (Addendum)
Get help right away if: You have new or worse pain in your neck or upper back. You have severe pain that cannot be controlled with medicines. You are dizzy. You have vision problems, blurred vision, or double vision. You have a severe headache that is worse when you stand. You have nausea or you vomit. You develop new or worse numbness or tingling in your back or legs. You have pain, redness, swelling, or warmth in your arm or leg.

## 2018-10-14 NOTE — ED Triage Notes (Signed)
Pt arrives to ED from home with complaints of bi lateral leg weakness for the last couple of weeks. Pt stated it got worse this morning to the point that he could not walk.
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
## 2018-10-15 ENCOUNTER — Other Ambulatory Visit: Payer: Self-pay

## 2018-10-15 ENCOUNTER — Ambulatory Visit (INDEPENDENT_AMBULATORY_CARE_PROVIDER_SITE_OTHER): Payer: Medicare Other | Admitting: Family Medicine

## 2018-10-15 ENCOUNTER — Encounter: Payer: Self-pay | Admitting: Family Medicine

## 2018-10-15 DIAGNOSIS — G894 Chronic pain syndrome: Secondary | ICD-10-CM

## 2018-10-15 DIAGNOSIS — M48061 Spinal stenosis, lumbar region without neurogenic claudication: Secondary | ICD-10-CM | POA: Diagnosis not present

## 2018-10-15 DIAGNOSIS — M4807 Spinal stenosis, lumbosacral region: Secondary | ICD-10-CM | POA: Diagnosis not present

## 2018-10-15 MED ORDER — HYDROCODONE-ACETAMINOPHEN 5-325 MG PO TABS: 1.0000 | ORAL_TABLET | Freq: Four times a day (QID) | ORAL | 0 refills | Status: DC | PRN
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
## 2018-10-16 DIAGNOSIS — M4712 Other spondylosis with myelopathy, cervical region: Secondary | ICD-10-CM | POA: Diagnosis not present

## 2018-10-20 ENCOUNTER — Other Ambulatory Visit: Payer: Self-pay | Admitting: Neurological Surgery

## 2018-10-20 DIAGNOSIS — M4712 Other spondylosis with myelopathy, cervical region: Secondary | ICD-10-CM

## 2018-10-22 ENCOUNTER — Other Ambulatory Visit: Payer: Self-pay

## 2018-10-22 ENCOUNTER — Ambulatory Visit (HOSPITAL_COMMUNITY)
Admission: RE | Admit: 2018-10-22 | Discharge: 2018-10-22 | Disposition: A | Discharge status: Home / Self Care | Payer: Medicare Other | Source: Ambulatory Visit | Attending: Neurological Surgery | Admitting: Neurological Surgery

## 2018-10-22 DIAGNOSIS — M4712 Other spondylosis with myelopathy, cervical region: Secondary | ICD-10-CM | POA: Diagnosis not present

## 2018-10-22 DIAGNOSIS — G5690 Unspecified mononeuropathy of unspecified upper limb: Secondary | ICD-10-CM | POA: Diagnosis not present

## 2018-10-22 IMAGING — MR MRI CERVICAL SPINE WITHOUT CONTRAST
4 of 5 series · 17 of 48 positions shown · non-contrast
Comparison: None.
COMPARISON: None.

Addendum:
CLINICAL DATA: Upper extremity neuropathy, lower extremity weakness
and pain

EXAM:
MRI CERVICAL SPINE WITHOUT CONTRAST
TECHNIQUE: Multiplanar, multisequence MR imaging of the cervical spine was
performed. No intravenous contrast was administered.

[Series 2: T2 · sagittal · 3.0mm · 0.43mm/px · 6 of 13 slices shown (1 of 2)]
[im 1/13]
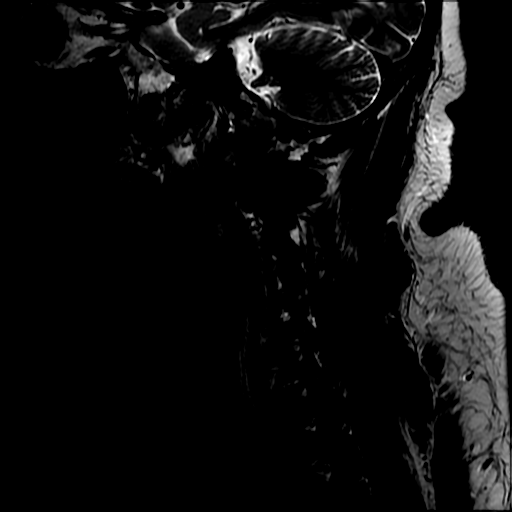
[im 3/13]
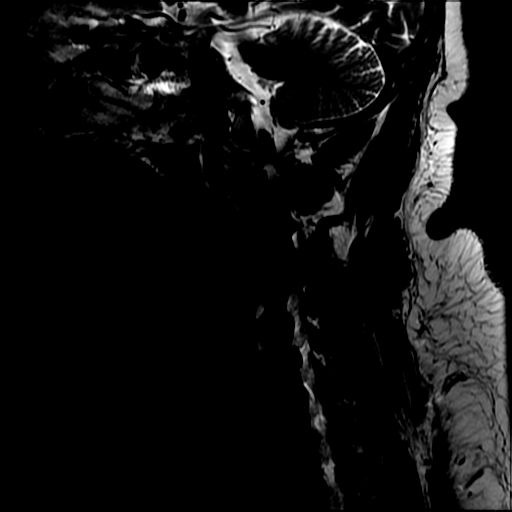
[im 5/13]
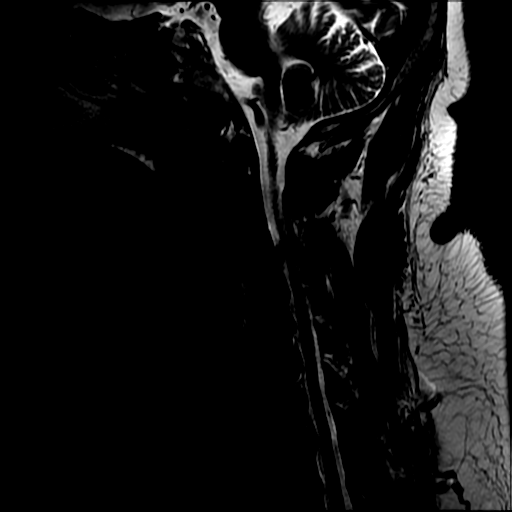
[im 8/13]
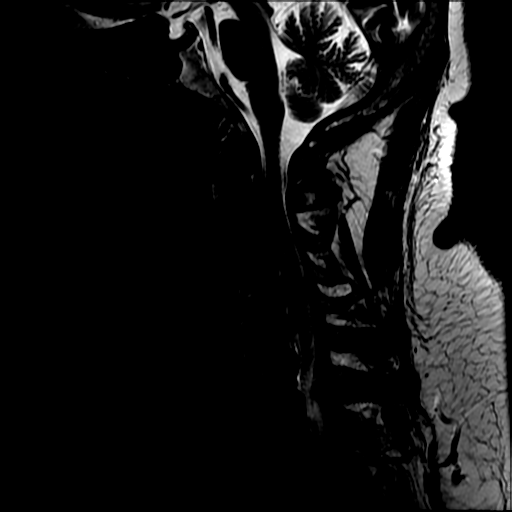
[im 10/13]
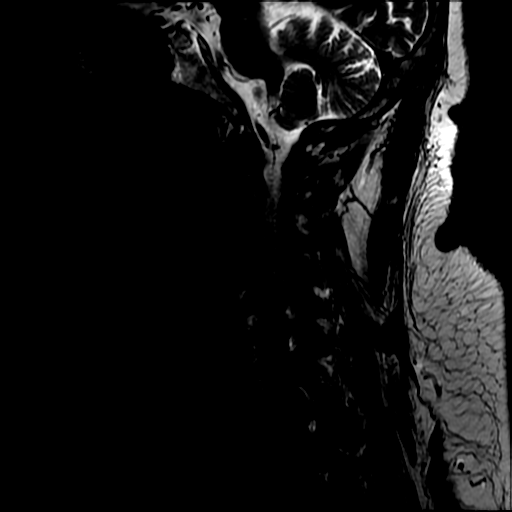
[im 13/13]
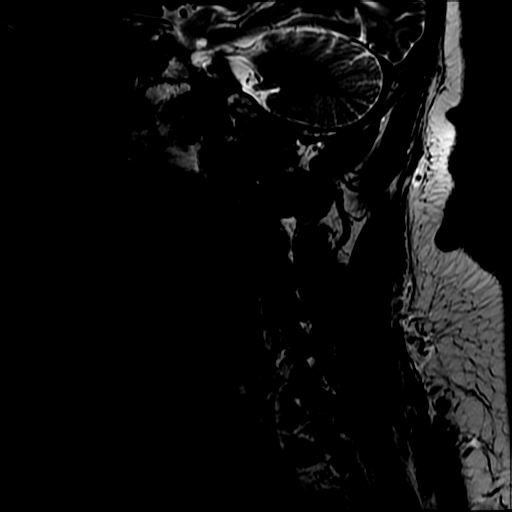

[Series 3: FLAIR · sagittal · 3.0mm · 0.43mm/px · 3 of 13 slices shown]
[im 3/13]
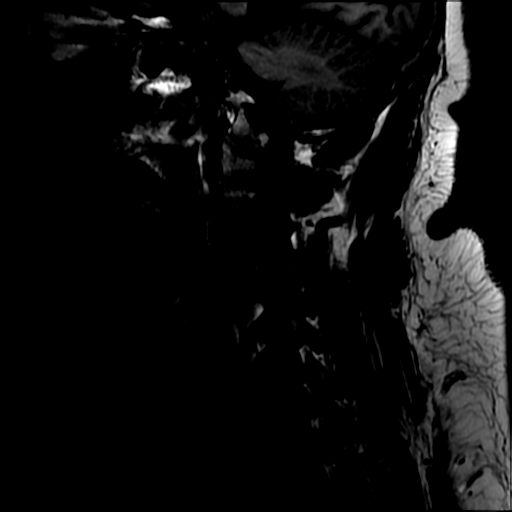
[im 8/13]
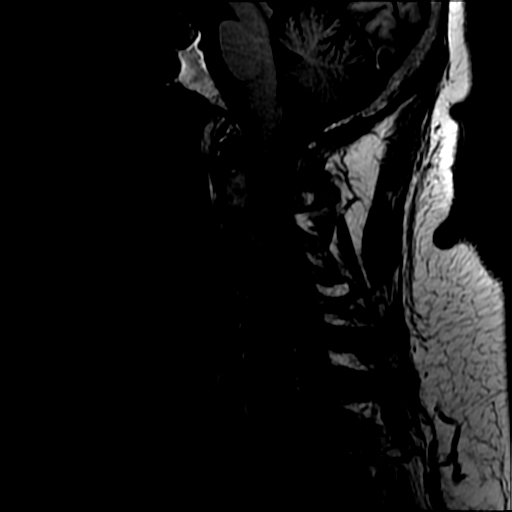
[im 13/13]
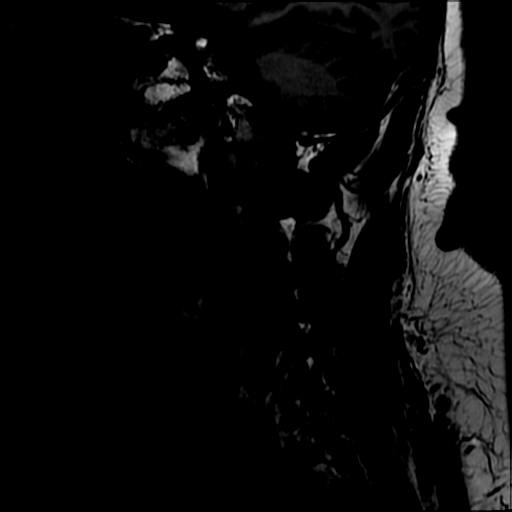

[Series 4: STIR · sagittal · 3.0mm · 0.43mm/px · 3 of 13 slices shown]
[im 3/13]
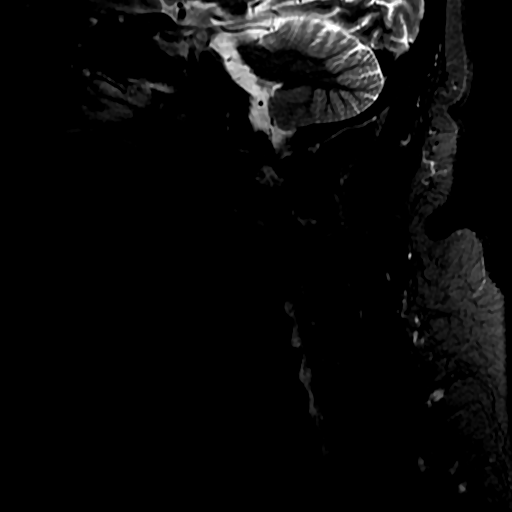
[im 8/13]
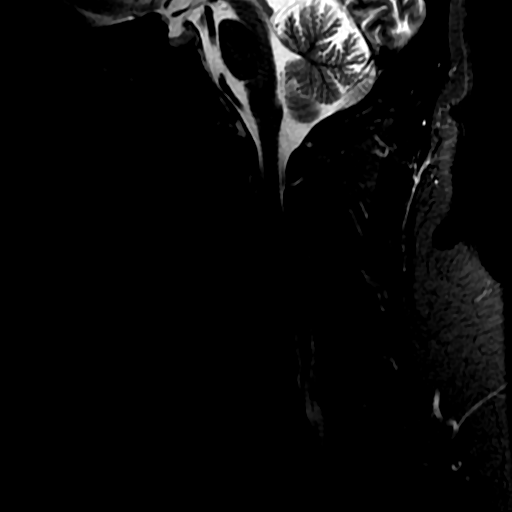
[im 13/13]
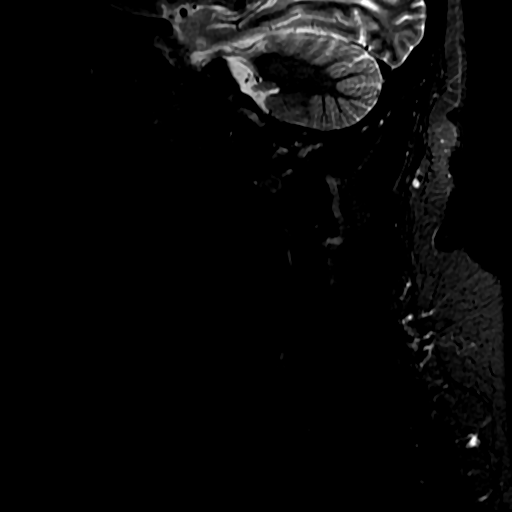

[Series 6: T2 · axial · 3.0mm · 0.35mm/px · z∈[-157,-78]mm · 5 of 29 slices shown (2 of 2)]
[im 1/29]
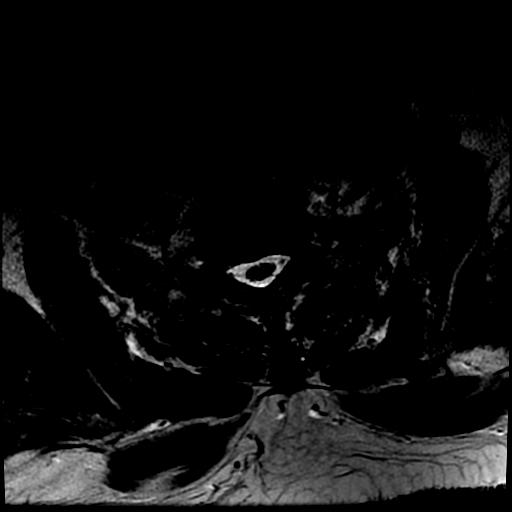
[im 5/29]
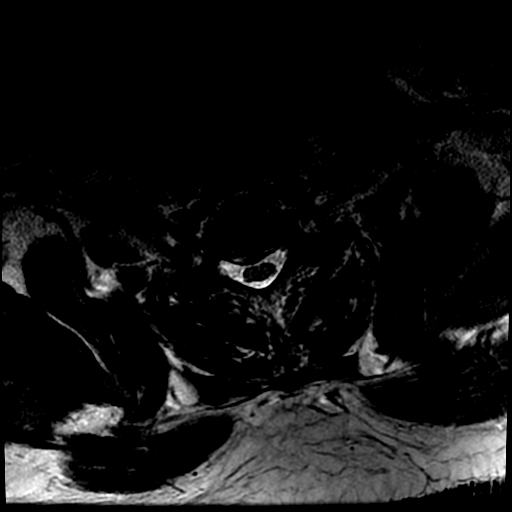
[im 9/29]
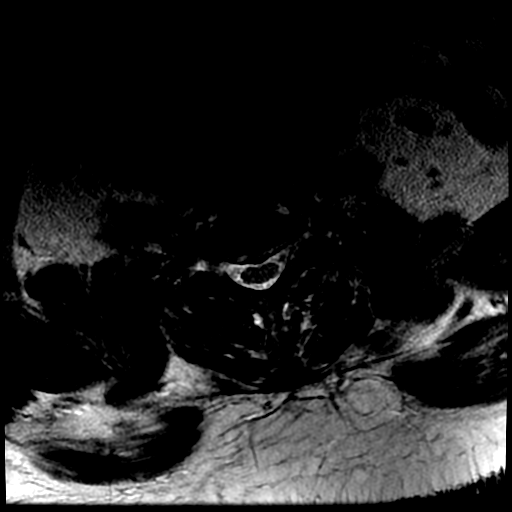
[im 16/29]
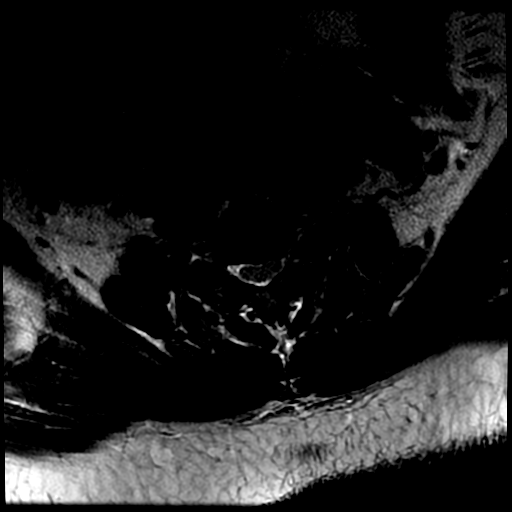
[im 24/29]
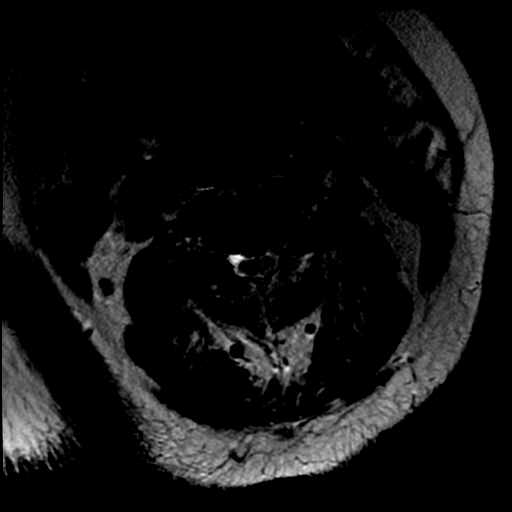

[17 of 48 positions shown; findings below may reference images not displayed]

FINDINGS: Alignment: Physiologic.

Vertebrae: No fracture, evidence of discitis, or bone lesion.

Cord: Normal signal and morphology.

Posterior Fossa, vertebral arteries, paraspinal tissues: Posterior
fossa demonstrates no focal abnormality. Vertebral artery flow voids
are maintained. Paraspinal soft tissues are unremarkable.

Disc levels:

Discs: Degenerative disc disease with disc height loss at C4-5, C5-6
and C6-7. Overall diminished caliber of the cervical spinal canal.

C2-3: No significant disc bulge. Moderate right facet arthropathy
and mild left facet arthropathy. Moderate right foraminal stenosis.
No left foraminal stenosis. No central canal stenosis.

C3-4: Broad central disc protrusion contacting the ventral cervical
spinal cord. Moderate bilateral facet arthropathy. Moderate-severe
right foraminal stenosis. Mild left foraminal stenosis. Moderate
spinal stenosis.

C4-5: Broad-based disc bulge. Moderate-severe bilateral foraminal
stenosis. Mild spinal stenosis.

C5-6: Broad-based disc bulge with a left paracentral disc protrusion
contacting the ventral cervical spinal cord. Moderate bilateral
facet arthropathy. Moderate bilateral foraminal stenosis. Mild
spinal stenosis.

C6-7: Mild broad-based disc bulge. Mild right and moderate-severe
left foraminal stenosis. No central canal stenosis.

C7-T1: Broad-based disc bulge. Moderate bilateral facet arthropathy.
Severe bilateral foraminal stenosis. No central canal stenosis.

T1-2: Mild broad-based disc bulge. No significant foraminal
stenosis.
IMPRESSION: 1. Cervical spine spondylosis as described above.

ADDENDUM:
The original report was by Dr. SOFTA. The following addendum
is by Dr. SOFTA:

I discussed this case by telephone with Dr. SOFTA at [DATE] p.m. on
[DATE]. He called to review the appearance of the cervical cord
in particular, and after discussion and viewing the images, there is
believed to be subtle but abnormal accentuated T2 signal in the
cervical cord at the C3-4 level probably relatable to the central
narrowing of the thecal sac at this level. The appearance is
compatible with gliosis or edema in the cord at C3-4. This
appearance is difficult to discern on the axial images due to the
degree of motion artifact, and is probably best appreciable on
images 6 through 9 of sagittal T2 series 2, using narrow windows to
bring out the signal.

*** End of Addendum ***
FINDINGS: Alignment: Physiologic.

Vertebrae: No fracture, evidence of discitis, or bone lesion.

Cord: Normal signal and morphology.

Posterior Fossa, vertebral arteries, paraspinal tissues: Posterior
fossa demonstrates no focal abnormality. Vertebral artery flow voids
are maintained. Paraspinal soft tissues are unremarkable.

Disc levels:

Discs: Degenerative disc disease with disc height loss at C4-5, C5-6
and C6-7. Overall diminished caliber of the cervical spinal canal.

C2-3: No significant disc bulge. Moderate right facet arthropathy
and mild left facet arthropathy. Moderate right foraminal stenosis.
No left foraminal stenosis. No central canal stenosis.

C3-4: Broad central disc protrusion contacting the ventral cervical
spinal cord. Moderate bilateral facet arthropathy. Moderate-severe
right foraminal stenosis. Mild left foraminal stenosis. Moderate
spinal stenosis.

C4-5: Broad-based disc bulge. Moderate-severe bilateral foraminal
stenosis. Mild spinal stenosis.

C5-6: Broad-based disc bulge with a left paracentral disc protrusion
contacting the ventral cervical spinal cord. Moderate bilateral
facet arthropathy. Moderate bilateral foraminal stenosis. Mild
spinal stenosis.

C6-7: Mild broad-based disc bulge. Mild right and moderate-severe
left foraminal stenosis. No central canal stenosis.

C7-T1: Broad-based disc bulge. Moderate bilateral facet arthropathy.
Severe bilateral foraminal stenosis. No central canal stenosis.

T1-2: Mild broad-based disc bulge. No significant foraminal
stenosis.
IMPRESSION: 1. Cervical spine spondylosis as described above.

## 2018-10-28 DIAGNOSIS — M4712 Other spondylosis with myelopathy, cervical region: Secondary | ICD-10-CM | POA: Diagnosis not present

## 2018-10-28 DIAGNOSIS — G959 Disease of spinal cord, unspecified: Secondary | ICD-10-CM | POA: Diagnosis not present

## 2018-10-28 DIAGNOSIS — I10 Essential (primary) hypertension: Secondary | ICD-10-CM | POA: Diagnosis not present

## 2018-10-29 ENCOUNTER — Other Ambulatory Visit: Payer: Self-pay | Admitting: Neurological Surgery

## 2018-10-30 ENCOUNTER — Other Ambulatory Visit: Payer: Self-pay | Admitting: Neurological Surgery

## 2018-10-30 NOTE — Progress Notes (Signed)
Called [facility], left message for Jessica for an order for COVID testing.  

## 2018-10-30 NOTE — Pre-Procedure Instructions (Signed)
Aaron Castro  10/30/2018      [id number] [place], [place] - [address] AT [facility] OF [address] [place] [address] Phone: [phone] Fax: [phone]    Your procedure is scheduled on Tues., Nov 04, 2018 from 1:48PM-3:25PM  Report to [facility] Entrance "A" at 11:45AM  Call this number if you have problems the morning of surgery:  [phone]   Remember:  Do not eat or drink after midnight on May 11th    Take these medicines the morning of surgery with A SIP OF WATER: AmLODipine (NORVASC), Finasteride (PROSCAR), Gabapentin (NEURONTIN), and Methocarbamol (ROBAXIN)    If needed: Acetaminophen (TYLENOL) and TraMADol (ULTRAM)  As of today, stop taking all Other Aspirin Products, Vitamins, Fish oils, and Herbal medications. Also stop all NSAIDS i.e. Advil, Ibuprofen, Motrin, Aleve, Anaprox, Naproxen, BC, Goody Powders, and all Supplements. Including: Diclofenac (VOLTAREN)   Special instructions:    [place]- Preparing For Surgery  Before surgery, you can play an important role. Because skin is not sterile, your skin needs to be as free of germs as possible. You can reduce the number of germs on your skin by washing with CHG (chlorahexidine gluconate) Soap before surgery.  CHG is an antiseptic cleaner which kills germs and bonds with the skin to continue killing germs even after washing.    Please do not use if you have an allergy to CHG or antibacterial soaps. If your skin becomes reddened/irritated stop using the CHG.  Do not shave (including legs and underarms) for at least 48 hours prior to first CHG shower. It is OK to shave your face.  Please follow these instructions carefully.   1. Shower the NIGHT BEFORE SURGERY and the MORNING OF SURGERY with CHG.   2. If you chose to wash your hair, wash your hair first as usual with your normal shampoo.  3. After you shampoo, rinse your hair and body  thoroughly to remove the shampoo.  4. Use CHG as you would any other liquid soap. You can apply CHG directly to the skin and wash gently with a scrungie or a clean washcloth.   5. Apply the CHG Soap to your body ONLY FROM THE NECK DOWN.  Do not use on open wounds or open sores. Avoid contact with your eyes, ears, mouth and genitals (private parts). Wash Face and genitals (private parts)  with your normal soap.  6. Wash thoroughly, paying special attention to the area where your surgery will be performed.  7. Thoroughly rinse your body with warm water from the neck down.  8. DO NOT shower/wash with your normal soap after using and rinsing off the CHG Soap.  9. Pat yourself dry with a CLEAN TOWEL.  10. Wear CLEAN PAJAMAS to bed the night before surgery, wear comfortable clothes the morning of surgery  11. Place CLEAN SHEETS on your bed the night of your first shower and DO NOT SLEEP WITH PETS.  Day of Surgery:  Oral Hygiene is also important to reduce your risk of infection.  Remember - BRUSH YOUR TEETH THE MORNING OF SURGERY WITH YOUR REGULAR TOOTHPASTE   Do not wear jewelry.  Do not wear lotions, powders, colognes, or deodorant.  Do not shave 48 hours prior to surgery.  Men may shave face.  Do not bring valuables to the hospital.  [facility] is not responsible for any belongings or valuables.  Contacts, dentures  or bridgework may not be worn into surgery.   For patients admitted to the hospital, discharge time will be determined by your treatment team.  Patients discharged the day of surgery will not be allowed to drive home.    Please wear clean clothes to the hospital/surgery center.     Please read over the following fact sheets that you were given. Pain Booklet, Coughing and Deep Breathing, MRSA Information and Surgical Site Infection Prevention

## 2018-10-31 ENCOUNTER — Other Ambulatory Visit (HOSPITAL_COMMUNITY)
Admission: RE | Admit: 2018-10-31 | Discharge: 2018-10-31 | Disposition: A | Discharge status: Home / Self Care | Payer: Medicare Other | Source: Ambulatory Visit | Attending: Neurological Surgery | Admitting: Neurological Surgery

## 2018-10-31 ENCOUNTER — Other Ambulatory Visit: Payer: Self-pay

## 2018-10-31 ENCOUNTER — Encounter (HOSPITAL_COMMUNITY): Payer: Self-pay

## 2018-10-31 ENCOUNTER — Encounter (HOSPITAL_COMMUNITY)
Admission: RE | Admit: 2018-10-31 | Discharge: 2018-10-31 | Disposition: A | Discharge status: Home / Self Care | Payer: Medicare Other | Source: Ambulatory Visit | Attending: Neurological Surgery | Admitting: Neurological Surgery

## 2018-10-31 DIAGNOSIS — Z01818 Encounter for other preprocedural examination: Secondary | ICD-10-CM | POA: Insufficient documentation

## 2018-10-31 DIAGNOSIS — Z1159 Encounter for screening for other viral diseases: Secondary | ICD-10-CM | POA: Diagnosis not present

## 2018-10-31 DIAGNOSIS — I10 Essential (primary) hypertension: Secondary | ICD-10-CM | POA: Insufficient documentation

## 2018-10-31 DIAGNOSIS — R9431 Abnormal electrocardiogram [ECG] [EKG]: Secondary | ICD-10-CM | POA: Insufficient documentation

## 2018-10-31 LAB — TYPE AND SCREEN
ABO/RH(D): A POS
Antibody Screen: NEGATIVE

## 2018-10-31 LAB — BASIC METABOLIC PANEL
Anion gap: 11 (ref 5–15)
BUN: 17 mg/dL (ref 8–23)
CO2: 25 mmol/L (ref 22–32)
Calcium: 9.5 mg/dL (ref 8.9–10.3)
Chloride: 102 mmol/L (ref 98–111)
Creatinine, Ser: 0.88 mg/dL (ref 0.61–1.24)
GFR calc Af Amer: >60 mL/min (ref >60)
GFR calc non Af Amer: >60 mL/min (ref >60)
Glucose, Bld: 104 mg/dL — ABNORMAL HIGH (ref 70–99)
Potassium: 3.7 mmol/L (ref 3.5–5.1)
Sodium: 138 mmol/L (ref 135–145)

## 2018-10-31 LAB — CBC
HCT: 46.9 % (ref 39.0–52.0)
Hemoglobin: 15.1 g/dL (ref 13.0–17.0)
MCH: 27.9 pg (ref 26.0–34.0)
MCHC: 32.2 g/dL (ref 30.0–36.0)
MCV: 86.7 fL (ref 80.0–100.0)
Platelets: 214 10*3/uL (ref 150–400)
RBC: 5.41 MIL/uL (ref 4.22–5.81)
RDW: 13.4 % (ref 11.5–15.5)
WBC: 11.2 10*3/uL — ABNORMAL HIGH (ref 4.0–10.5)
nRBC: 0 % (ref 0.0–0.2)

## 2018-10-31 LAB — SURGICAL PCR SCREEN
MRSA, PCR: NEGATIVE
Staphylococcus aureus: POSITIVE — AB

## 2018-10-31 LAB — ABO/RH: ABO/RH(D): A POS

## 2018-10-31 NOTE — Progress Notes (Addendum)
PCP - [place]   Chest x-ray - N/A EKG - 10-31-18 ECHO - 2007   Anesthesia review: no  Patient denies fever, cough and chest pain at PAT appointment Pt is SOB upon exertion - not a new finding   Patient verbalized understanding of instructions that were given to them at the PAT appointment. Patient was also instructed that they will need to review over the PAT instructions again at home before surgery.

## 2018-11-01 LAB — NOVEL CORONAVIRUS, NAA (HOSP ORDER, SEND-OUT TO REF LAB; TAT 18-24 HRS): SARS-CoV-2, NAA: NOT DETECTED

## 2018-11-02 ENCOUNTER — Other Ambulatory Visit: Payer: Self-pay

## 2018-11-02 ENCOUNTER — Encounter (HOSPITAL_COMMUNITY): Payer: Self-pay | Admitting: Emergency Medicine

## 2018-11-02 ENCOUNTER — Other Ambulatory Visit: Payer: Self-pay | Admitting: Family Medicine

## 2018-11-02 ENCOUNTER — Emergency Department (HOSPITAL_COMMUNITY)
Admission: EM | Admit: 2018-11-02 | Discharge: 2018-11-02 | Disposition: A | Discharge status: Home / Self Care | Payer: Medicare Other | Attending: Emergency Medicine | Admitting: Emergency Medicine

## 2018-11-02 DIAGNOSIS — Z87891 Personal history of nicotine dependence: Secondary | ICD-10-CM | POA: Insufficient documentation

## 2018-11-02 DIAGNOSIS — Z79899 Other long term (current) drug therapy: Secondary | ICD-10-CM | POA: Diagnosis not present

## 2018-11-02 DIAGNOSIS — I10 Essential (primary) hypertension: Secondary | ICD-10-CM | POA: Diagnosis not present

## 2018-11-02 DIAGNOSIS — M542 Cervicalgia: Secondary | ICD-10-CM

## 2018-11-02 MED ORDER — METHOCARBAMOL 500 MG PO TABS: 500.0000 mg | ORAL_TABLET | Freq: Two times a day (BID) | ORAL | 0 refills | Status: DC | PRN

## 2018-11-02 MED ORDER — HYDROMORPHONE HCL 1 MG/ML IJ SOLN
1.0000 mg | Freq: Once | INTRAMUSCULAR | Status: AC
  Administered 2018-11-02: 10:00:00 1 mg via INTRAMUSCULAR
  Filled 2018-11-02: qty 1

## 2018-11-02 NOTE — ED Triage Notes (Signed)
Pt arrives to ED from home complaining of neck pain and weakness. Pt states that the pain got worse last night and that is reason for visit today. Pt states that he is supposed to have surgery this Tuesday on his spine.

## 2018-11-02 NOTE — Discharge Instructions (Signed)
Please come back to the ER for worsening pain / numbness or weakness. See your doctor for your planned surgery on Tuesday
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
## 2018-11-03 ENCOUNTER — Telehealth: Payer: Self-pay | Admitting: Family Medicine

## 2018-11-03 DIAGNOSIS — M48061 Spinal stenosis, lumbar region without neurogenic claudication: Secondary | ICD-10-CM

## 2018-11-03 DIAGNOSIS — M4807 Spinal stenosis, lumbosacral region: Secondary | ICD-10-CM

## 2018-11-03 MED ORDER — HYDROCODONE-ACETAMINOPHEN 5-325 MG PO TABS: 1.0000 | ORAL_TABLET | Freq: Four times a day (QID) | ORAL | 0 refills | Status: DC | PRN

## 2018-11-03 MED ORDER — DEXTROSE 5 % IV SOLN
3.0000 g | INTRAVENOUS | Status: AC
  Administered 2018-11-04: 3 g via INTRAVENOUS
  Filled 2018-11-03: qty 3000

## 2018-11-03 NOTE — Telephone Encounter (Signed)
  Patient came by and wants refill on hydrocodone-acetaminophen To [facility]

## 2018-11-04 ENCOUNTER — Ambulatory Visit (HOSPITAL_COMMUNITY): Payer: Medicare Other

## 2018-11-04 ENCOUNTER — Observation Stay (HOSPITAL_COMMUNITY)
Admission: RE | Admit: 2018-11-04 | Discharge: 2018-11-05 | Disposition: A | Discharge status: Home / Self Care | Payer: Medicare Other | Attending: Neurological Surgery | Admitting: Neurological Surgery

## 2018-11-04 ENCOUNTER — Encounter (HOSPITAL_COMMUNITY): Payer: Self-pay

## 2018-11-04 ENCOUNTER — Ambulatory Visit (HOSPITAL_COMMUNITY): Payer: Medicare Other | Admitting: Vascular Surgery

## 2018-11-04 ENCOUNTER — Encounter (HOSPITAL_COMMUNITY)
Admission: RE | Disposition: A | Discharge status: Home / Self Care | Payer: Self-pay | Source: Home / Self Care | Attending: Neurological Surgery

## 2018-11-04 ENCOUNTER — Ambulatory Visit (HOSPITAL_COMMUNITY): Payer: Medicare Other | Admitting: Anesthesiology

## 2018-11-04 ENCOUNTER — Other Ambulatory Visit: Payer: Self-pay

## 2018-11-04 DIAGNOSIS — K5730 Diverticulosis of large intestine without perforation or abscess without bleeding: Secondary | ICD-10-CM | POA: Diagnosis not present

## 2018-11-04 DIAGNOSIS — Z87891 Personal history of nicotine dependence: Secondary | ICD-10-CM | POA: Insufficient documentation

## 2018-11-04 DIAGNOSIS — Z6841 Body Mass Index (BMI) 40.0 and over, adult: Secondary | ICD-10-CM | POA: Insufficient documentation

## 2018-11-04 DIAGNOSIS — G959 Disease of spinal cord, unspecified: Secondary | ICD-10-CM | POA: Diagnosis present

## 2018-11-04 DIAGNOSIS — Z419 Encounter for procedure for purposes other than remedying health state, unspecified: Secondary | ICD-10-CM

## 2018-11-04 DIAGNOSIS — R531 Weakness: Secondary | ICD-10-CM | POA: Diagnosis present

## 2018-11-04 DIAGNOSIS — I10 Essential (primary) hypertension: Secondary | ICD-10-CM | POA: Diagnosis not present

## 2018-11-04 DIAGNOSIS — E785 Hyperlipidemia, unspecified: Secondary | ICD-10-CM | POA: Diagnosis not present

## 2018-11-04 DIAGNOSIS — M4712 Other spondylosis with myelopathy, cervical region: Principal | ICD-10-CM | POA: Insufficient documentation

## 2018-11-04 DIAGNOSIS — M5001 Cervical disc disorder with myelopathy,  high cervical region: Secondary | ICD-10-CM | POA: Insufficient documentation

## 2018-11-04 DIAGNOSIS — M4802 Spinal stenosis, cervical region: Secondary | ICD-10-CM | POA: Diagnosis not present

## 2018-11-04 DIAGNOSIS — M4322 Fusion of spine, cervical region: Secondary | ICD-10-CM | POA: Diagnosis not present

## 2018-11-04 HISTORY — PX: ANTERIOR CERVICAL DECOMP/DISCECTOMY FUSION: SHX1161

## 2018-11-04 IMAGING — CR CERVICAL SPINE - COMPLETE 4+ VIEW
3 series · 3 of 3 positions shown · non-contrast
Comparison: [DATE]

CLINICAL DATA: Cervical fusion.

EXAM:
CERVICAL SPINE - COMPLETE 4+ VIEW

[xtable lateral (1 of 3)]
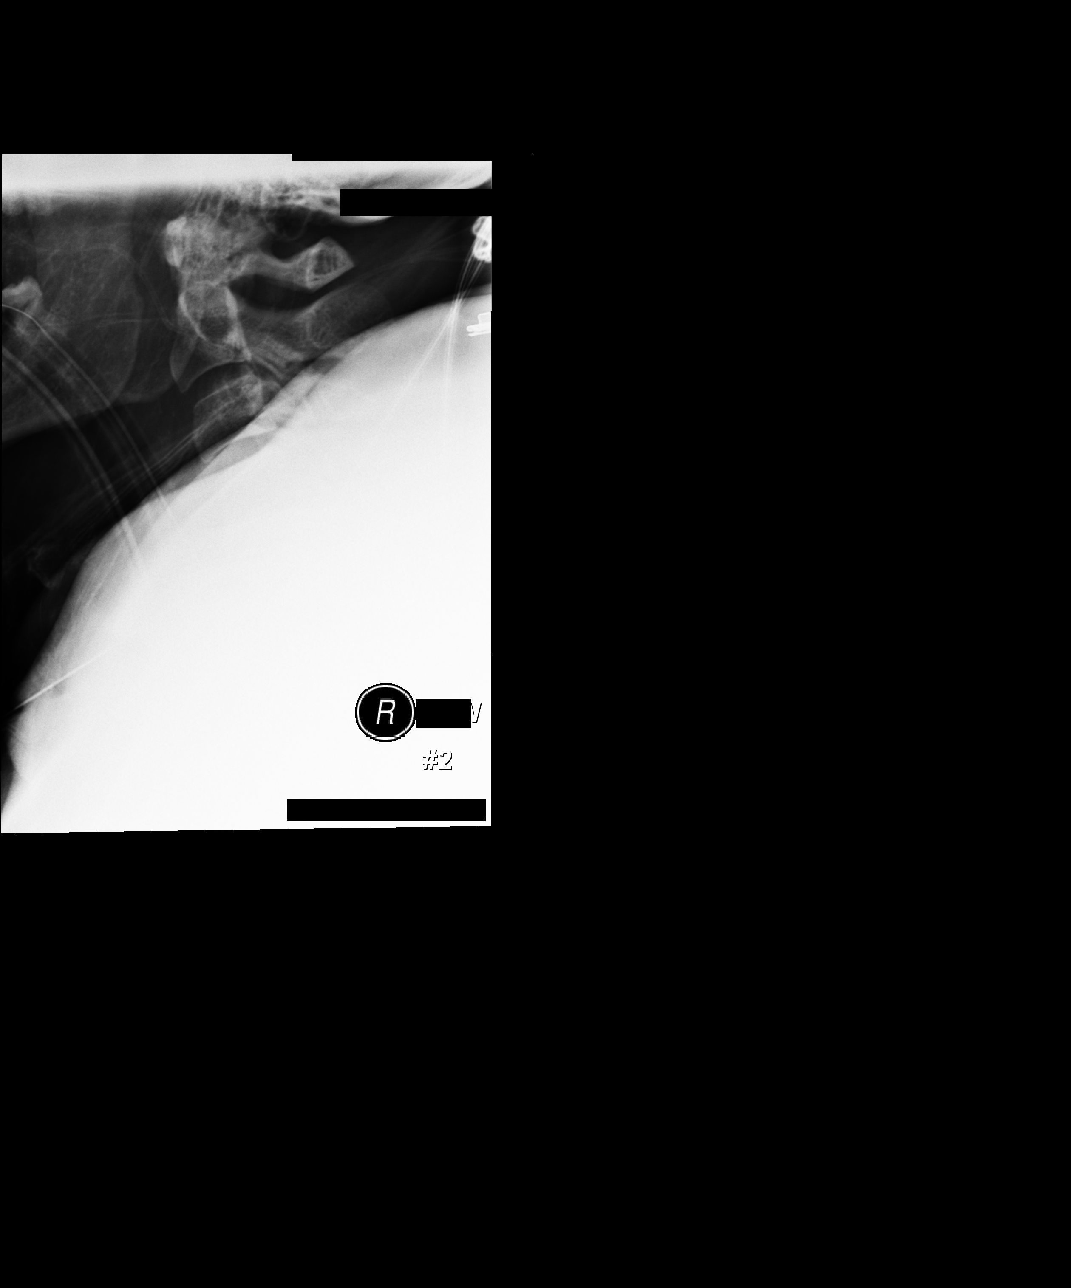

[xtable lateral (2 of 3)]
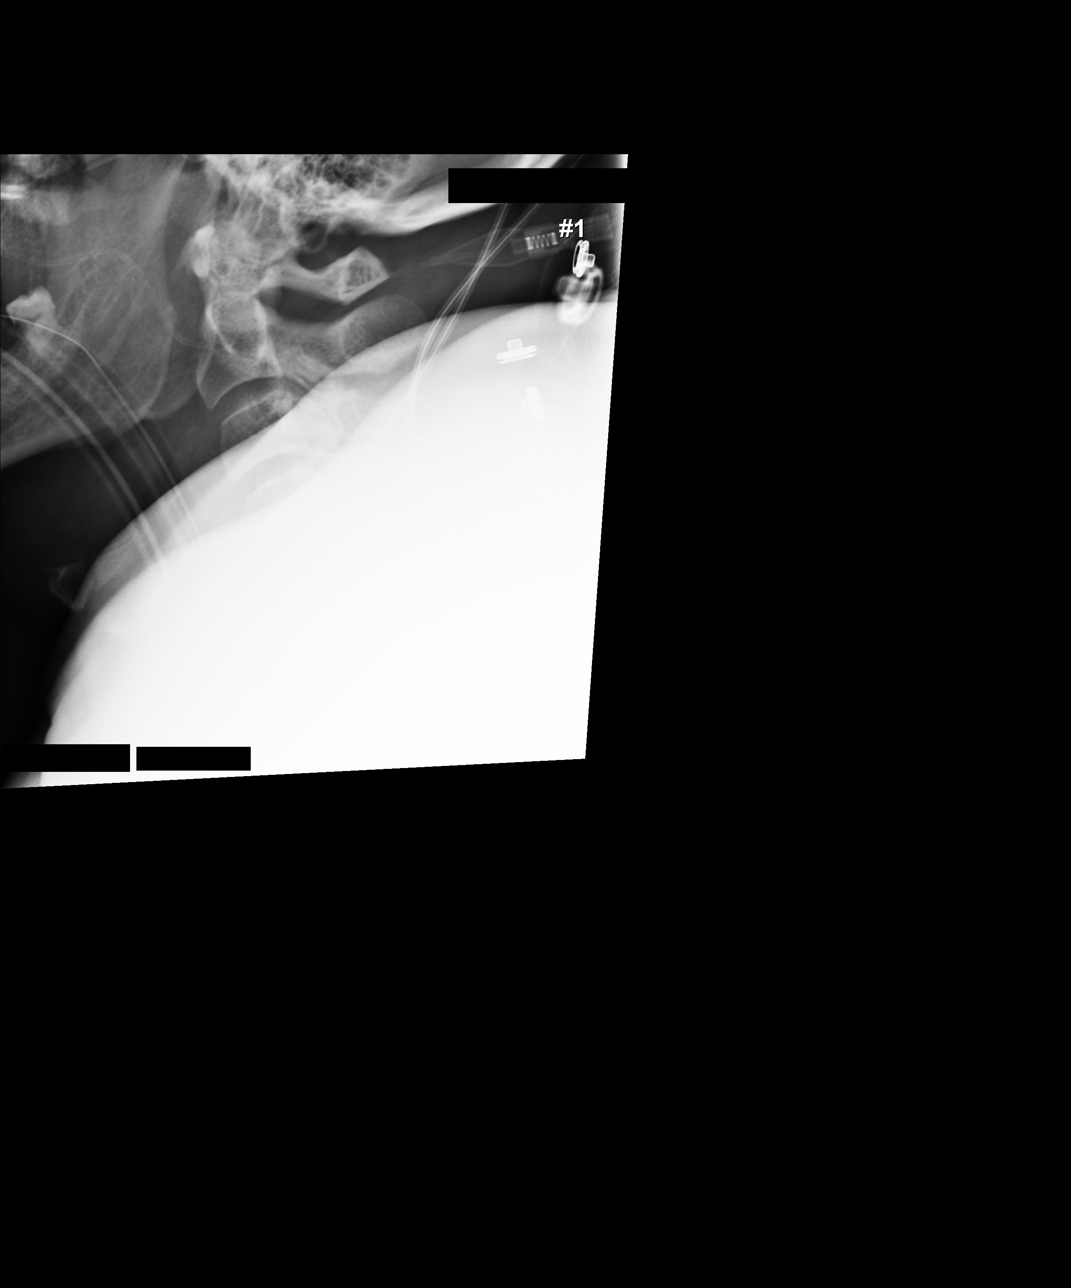

[xtable lateral (3 of 3)]
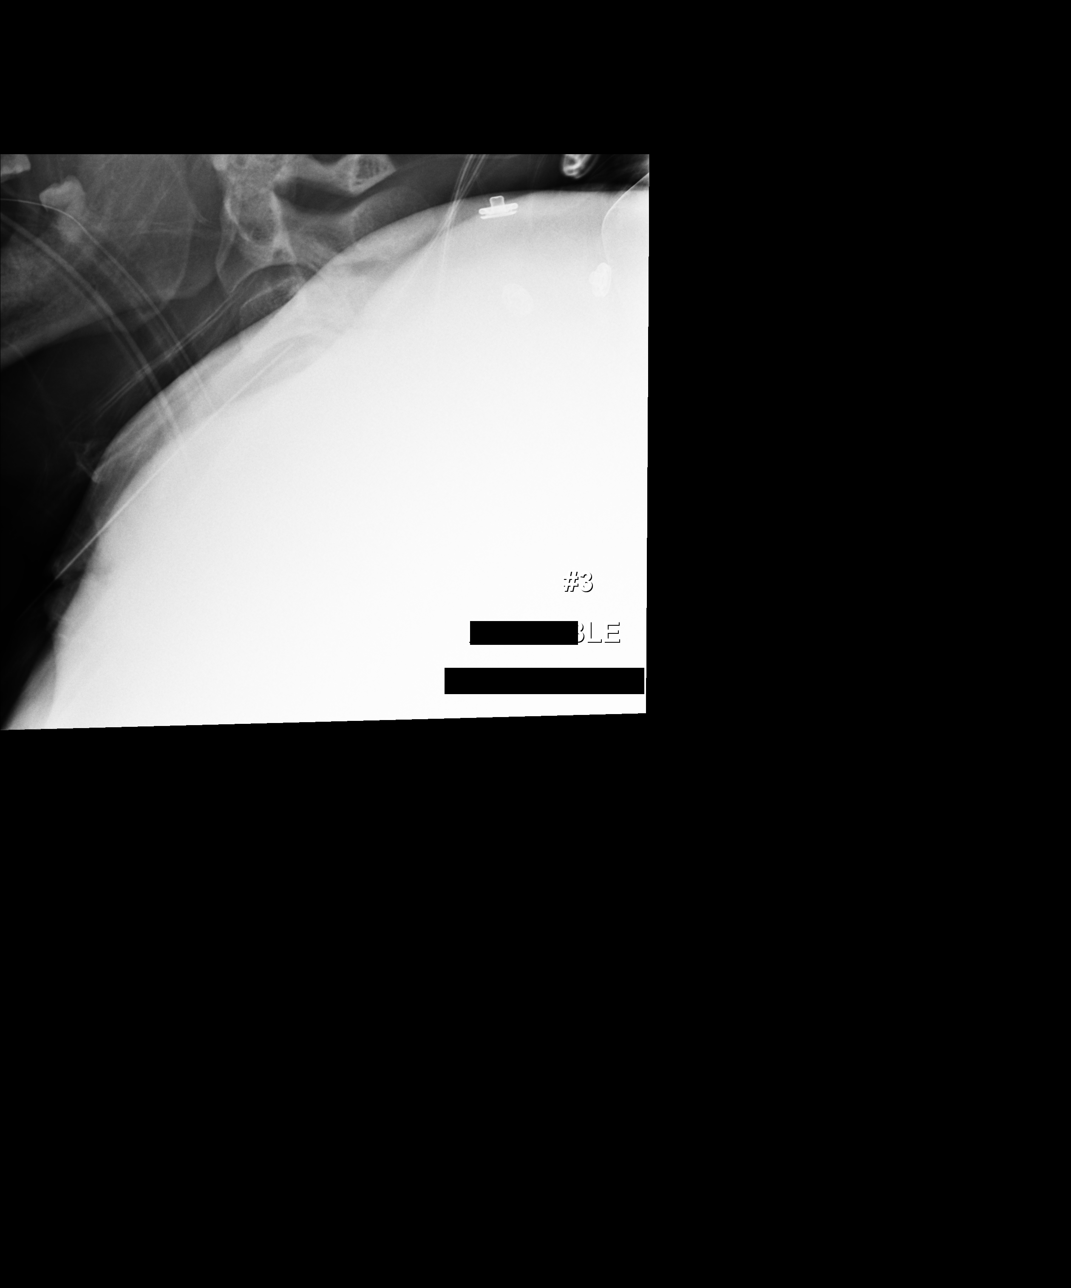

[3 of 3 positions shown; findings below may reference images not displayed]

FINDINGS: Four cross-table lateral intraoperative radiographs of the cervical
spine are provided. The patient's shoulders obscure the cervical
spine from C4 and below. A needle tip on the second radiograph is
suboptimally visualized but appears to be at the C4-5 disc space
level. The tip of a needle on the third radiograph projects over the
C3-4 disc space. The fourth radiograph demonstrates an anterior
fusion plate and screws at C3-4 as well as an interbody implants. An
endotracheal tube is partially visualized.
IMPRESSION: Intraoperative images during C3-4 ACDF.

## 2018-11-04 IMAGING — CR CERVICAL SPINE - COMPLETE 4+ VIEW
1 series · 1 of 1 positions shown · non-contrast
Comparison: [DATE]

CLINICAL DATA: Cervical fusion.

EXAM:
CERVICAL SPINE - COMPLETE 4+ VIEW

[lateral]
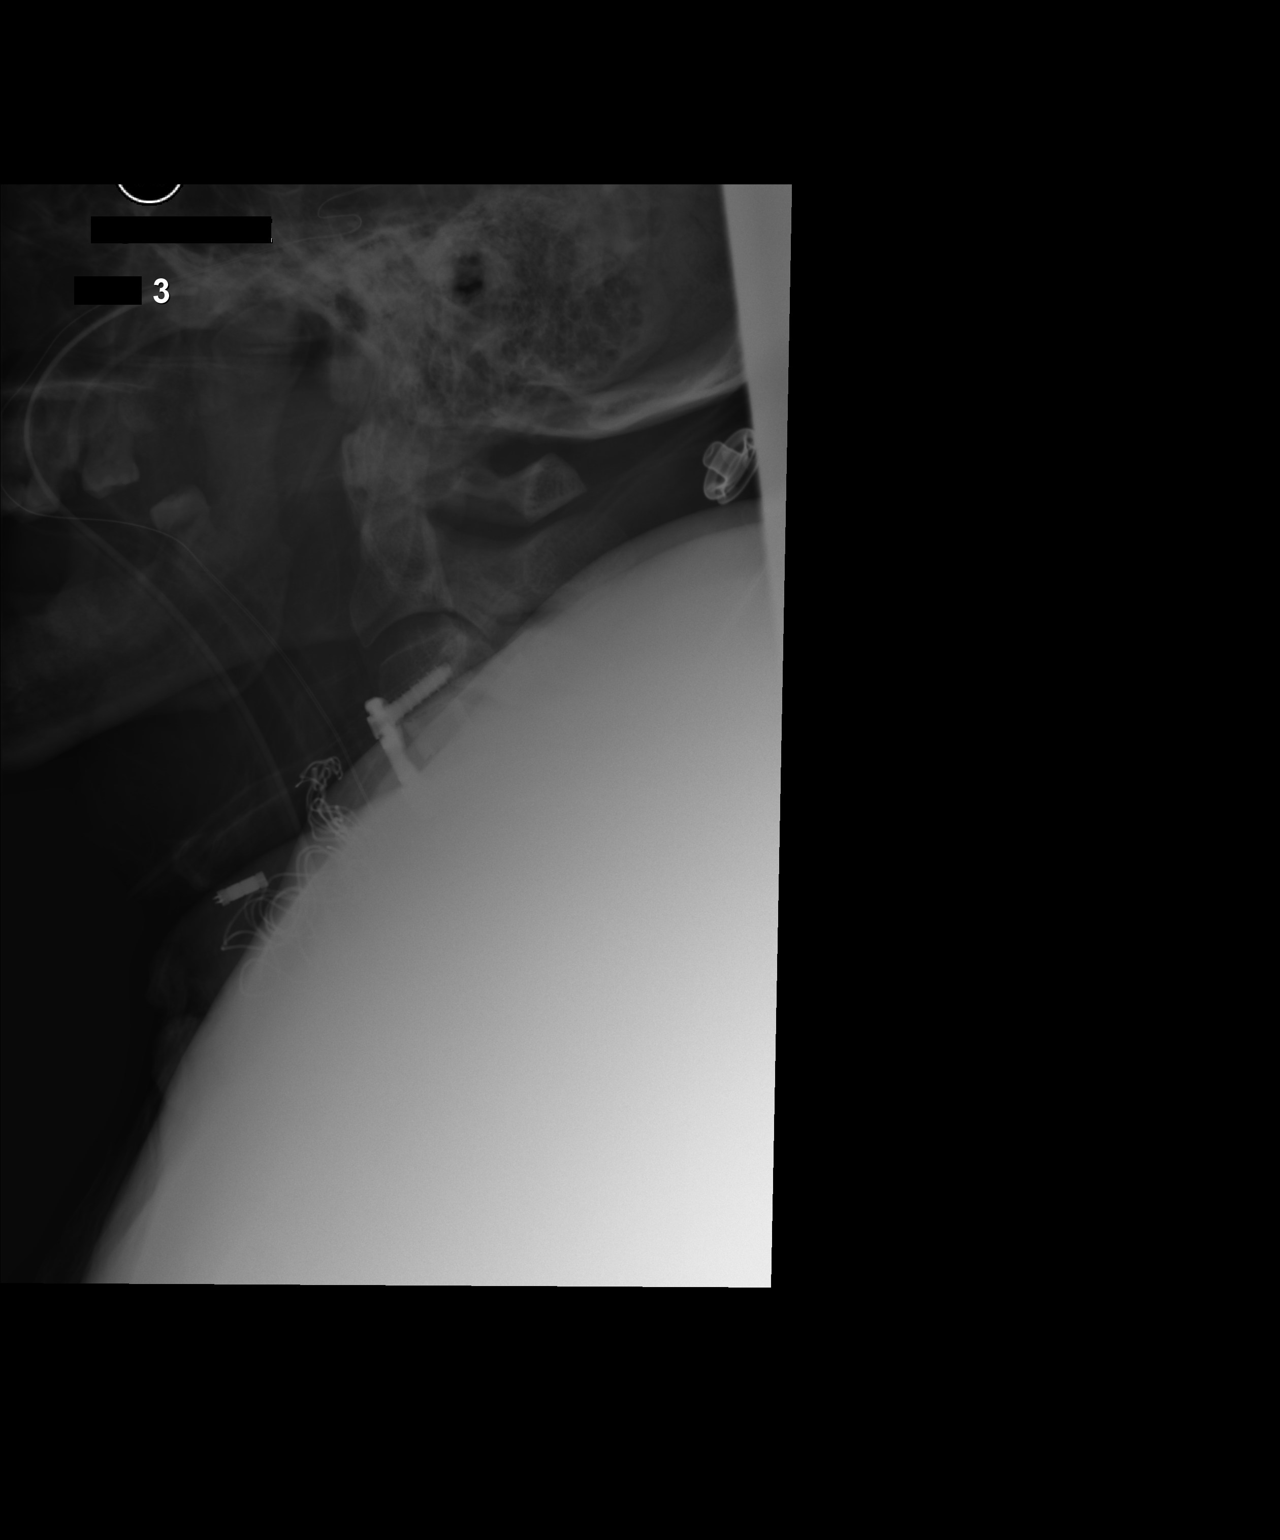

[1 of 1 positions shown; findings below may reference images not displayed]

FINDINGS: Four cross-table lateral intraoperative radiographs of the cervical
spine are provided. The patient's shoulders obscure the cervical
spine from C4 and below. A needle tip on the second radiograph is
suboptimally visualized but appears to be at the C4-5 disc space
level. The tip of a needle on the third radiograph projects over the
C3-4 disc space. The fourth radiograph demonstrates an anterior
fusion plate and screws at C3-4 as well as an interbody implants. An
endotracheal tube is partially visualized.
IMPRESSION: Intraoperative images during C3-4 ACDF.

## 2018-11-04 SURGERY — ANTERIOR CERVICAL DECOMPRESSION/DISCECTOMY FUSION 1 LEVEL
Anesthesia: General | Site: Spine Cervical

## 2018-11-04 MED ORDER — ACETAMINOPHEN 325 MG PO TABS: 650.0000 mg | ORAL_TABLET | ORAL | Status: DC | PRN

## 2018-11-04 MED ORDER — FINASTERIDE 5 MG PO TABS
5.0000 mg | ORAL_TABLET | Freq: Every day | ORAL | Status: DC
  Administered 2018-11-05: 10:00:00 5 mg via ORAL
  Filled 2018-11-04: qty 1

## 2018-11-04 MED ORDER — THROMBIN 5000 UNITS EX SOLR
OROMUCOSAL | Status: DC | PRN
  Administered 2018-11-04: 16:00:00 via TOPICAL

## 2018-11-04 MED ORDER — ONDANSETRON HCL 4 MG/2ML IJ SOLN: 4.0000 mg | Freq: Four times a day (QID) | INTRAMUSCULAR | Status: DC | PRN

## 2018-11-04 MED ORDER — SENNA 8.6 MG PO TABS
1.0000 | ORAL_TABLET | Freq: Two times a day (BID) | ORAL | Status: DC
  Administered 2018-11-04 – 2018-11-05 (×2): 8.6 mg via ORAL
  Filled 2018-11-04 (×2): qty 1

## 2018-11-04 MED ORDER — MIDAZOLAM HCL 2 MG/2ML IJ SOLN
INTRAMUSCULAR | Status: DC | PRN
  Administered 2018-11-04: 2 mg via INTRAVENOUS

## 2018-11-04 MED ORDER — METHOCARBAMOL 1000 MG/10ML IJ SOLN
500.0000 mg | Freq: Four times a day (QID) | INTRAVENOUS | Status: DC | PRN
  Administered 2018-11-04: 23:00:00 500 mg via INTRAVENOUS
  Filled 2018-11-04 (×2): qty 5

## 2018-11-04 MED ORDER — CHLORHEXIDINE GLUCONATE CLOTH 2 % EX PADS: 6.0000 | MEDICATED_PAD | Freq: Once | CUTANEOUS | Status: DC

## 2018-11-04 MED ORDER — FENTANYL CITRATE (PF) 250 MCG/5ML IJ SOLN
INTRAMUSCULAR | Status: DC | PRN
  Administered 2018-11-04: 50 ug via INTRAVENOUS
  Administered 2018-11-04 (×2): 100 ug via INTRAVENOUS
  Administered 2018-11-04: 50 ug via INTRAVENOUS
  Administered 2018-11-04 (×2): 100 ug via INTRAVENOUS

## 2018-11-04 MED ORDER — SODIUM CHLORIDE 0.9% FLUSH: 3.0000 mL | INTRAVENOUS | Status: DC | PRN

## 2018-11-04 MED ORDER — PROPOFOL 10 MG/ML IV BOLUS
INTRAVENOUS | Status: AC
  Filled 2018-11-04: qty 20

## 2018-11-04 MED ORDER — POLYETHYLENE GLYCOL 3350 17 G PO PACK: 17.0000 g | PACK | Freq: Every day | ORAL | Status: DC | PRN

## 2018-11-04 MED ORDER — ACETAMINOPHEN 10 MG/ML IV SOLN
INTRAVENOUS | Status: DC | PRN
  Administered 2018-11-04: 1000 mg via INTRAVENOUS

## 2018-11-04 MED ORDER — 0.9 % SODIUM CHLORIDE (POUR BTL) OPTIME
TOPICAL | Status: DC | PRN
  Administered 2018-11-04: 1000 mL

## 2018-11-04 MED ORDER — HYDROCODONE-ACETAMINOPHEN 7.5-325 MG PO TABS
1.0000 | ORAL_TABLET | Freq: Once | ORAL | Status: AC | PRN
  Administered 2018-11-04: 18:00:00 1 via ORAL

## 2018-11-04 MED ORDER — BISACODYL 10 MG RE SUPP: 10.0000 mg | Freq: Every day | RECTAL | Status: DC | PRN

## 2018-11-04 MED ORDER — LISINOPRIL-HYDROCHLOROTHIAZIDE 20-25 MG PO TABS: 1.0000 | ORAL_TABLET | Freq: Every day | ORAL | Status: DC

## 2018-11-04 MED ORDER — SODIUM CHLORIDE 0.9% FLUSH
3.0000 mL | Freq: Two times a day (BID) | INTRAVENOUS | Status: DC
  Administered 2018-11-04 – 2018-11-05 (×2): 3 mL via INTRAVENOUS

## 2018-11-04 MED ORDER — ROCURONIUM BROMIDE 50 MG/5ML IV SOSY
PREFILLED_SYRINGE | INTRAVENOUS | Status: DC | PRN
  Administered 2018-11-04: 50 mg via INTRAVENOUS

## 2018-11-04 MED ORDER — HYDROCHLOROTHIAZIDE 25 MG PO TABS
25.0000 mg | ORAL_TABLET | Freq: Every day | ORAL | Status: DC
  Administered 2018-11-05: 25 mg via ORAL
  Filled 2018-11-04: qty 1

## 2018-11-04 MED ORDER — LACTATED RINGERS IV SOLN
INTRAVENOUS | Status: DC | PRN
  Administered 2018-11-04: 15:00:00 via INTRAVENOUS

## 2018-11-04 MED ORDER — SUCCINYLCHOLINE CHLORIDE 200 MG/10ML IV SOSY
PREFILLED_SYRINGE | INTRAVENOUS | Status: AC
  Filled 2018-11-04: qty 10

## 2018-11-04 MED ORDER — FENTANYL CITRATE (PF) 100 MCG/2ML IJ SOLN
100.0000 ug | Freq: Once | INTRAMUSCULAR | Status: AC
  Administered 2018-11-04: 100 ug via INTRAVENOUS

## 2018-11-04 MED ORDER — GABAPENTIN 600 MG PO TABS
600.0000 mg | ORAL_TABLET | Freq: Three times a day (TID) | ORAL | Status: DC
  Administered 2018-11-04 – 2018-11-05 (×2): 600 mg via ORAL
  Filled 2018-11-04 (×2): qty 1

## 2018-11-04 MED ORDER — LACTATED RINGERS IV SOLN
INTRAVENOUS | Status: DC
  Administered 2018-11-04: 12:00:00 via INTRAVENOUS

## 2018-11-04 MED ORDER — DEXAMETHASONE SODIUM PHOSPHATE 10 MG/ML IJ SOLN
INTRAMUSCULAR | Status: AC
  Filled 2018-11-04: qty 5

## 2018-11-04 MED ORDER — MEPERIDINE HCL 25 MG/ML IJ SOLN: 6.2500 mg | INTRAMUSCULAR | Status: DC | PRN

## 2018-11-04 MED ORDER — SODIUM CHLORIDE 0.9 % IV SOLN
INTRAVENOUS | Status: DC | PRN
  Administered 2018-11-04: 16:00:00

## 2018-11-04 MED ORDER — TRAMADOL HCL 50 MG PO TABS
100.0000 mg | ORAL_TABLET | Freq: Three times a day (TID) | ORAL | Status: DC | PRN
  Administered 2018-11-05: 100 mg via ORAL
  Filled 2018-11-04: qty 2

## 2018-11-04 MED ORDER — MENTHOL 3 MG MT LOZG: 1.0000 | LOZENGE | OROMUCOSAL | Status: DC | PRN

## 2018-11-04 MED ORDER — ACETAMINOPHEN 650 MG RE SUPP: 650.0000 mg | RECTAL | Status: DC | PRN

## 2018-11-04 MED ORDER — THROMBIN 5000 UNITS EX SOLR
CUTANEOUS | Status: AC
  Filled 2018-11-04: qty 5000

## 2018-11-04 MED ORDER — SODIUM CHLORIDE 0.9 % IV SOLN
250.0000 mL | INTRAVENOUS | Status: DC
  Administered 2018-11-04: 21:00:00 250 mL via INTRAVENOUS

## 2018-11-04 MED ORDER — ACETAMINOPHEN 10 MG/ML IV SOLN
INTRAVENOUS | Status: AC
  Filled 2018-11-04: qty 100

## 2018-11-04 MED ORDER — SUCCINYLCHOLINE CHLORIDE 200 MG/10ML IV SOSY
PREFILLED_SYRINGE | INTRAVENOUS | Status: DC | PRN
  Administered 2018-11-04: 160 mg via INTRAVENOUS

## 2018-11-04 MED ORDER — ESMOLOL HCL 100 MG/10ML IV SOLN
INTRAVENOUS | Status: AC
  Filled 2018-11-04: qty 10

## 2018-11-04 MED ORDER — ACETAMINOPHEN 500 MG PO TABS: 500.0000 mg | ORAL_TABLET | Freq: Four times a day (QID) | ORAL | Status: DC | PRN

## 2018-11-04 MED ORDER — ALUM & MAG HYDROXIDE-SIMETH 200-200-20 MG/5ML PO SUSP: 30.0000 mL | Freq: Four times a day (QID) | ORAL | Status: DC | PRN

## 2018-11-04 MED ORDER — FENTANYL CITRATE (PF) 100 MCG/2ML IJ SOLN
INTRAMUSCULAR | Status: AC
  Administered 2018-11-04: 15:00:00 100 ug via INTRAVENOUS
  Filled 2018-11-04: qty 2

## 2018-11-04 MED ORDER — SUGAMMADEX SODIUM 200 MG/2ML IV SOLN
INTRAVENOUS | Status: DC | PRN
  Administered 2018-11-04: 360 mg via INTRAVENOUS

## 2018-11-04 MED ORDER — FENTANYL CITRATE (PF) 250 MCG/5ML IJ SOLN
INTRAMUSCULAR | Status: AC
  Filled 2018-11-04: qty 5

## 2018-11-04 MED ORDER — LIDOCAINE 2% (20 MG/ML) 5 ML SYRINGE
INTRAMUSCULAR | Status: DC | PRN
  Administered 2018-11-04: 100 mg via INTRAVENOUS

## 2018-11-04 MED ORDER — MORPHINE SULFATE (PF) 2 MG/ML IV SOLN
2.0000 mg | INTRAVENOUS | Status: DC | PRN
  Administered 2018-11-04: 21:00:00 4 mg via INTRAVENOUS
  Filled 2018-11-04: qty 2

## 2018-11-04 MED ORDER — ONDANSETRON HCL 4 MG/2ML IJ SOLN: 4.0000 mg | Freq: Once | INTRAMUSCULAR | Status: DC | PRN

## 2018-11-04 MED ORDER — METHOCARBAMOL 500 MG PO TABS
500.0000 mg | ORAL_TABLET | Freq: Four times a day (QID) | ORAL | Status: DC | PRN
  Administered 2018-11-05: 10:00:00 500 mg via ORAL
  Filled 2018-11-04: qty 1

## 2018-11-04 MED ORDER — HYDROMORPHONE HCL 1 MG/ML IJ SOLN
INTRAMUSCULAR | Status: AC
  Filled 2018-11-04: qty 1

## 2018-11-04 MED ORDER — LIDOCAINE 2% (20 MG/ML) 5 ML SYRINGE
INTRAMUSCULAR | Status: AC
  Filled 2018-11-04: qty 5

## 2018-11-04 MED ORDER — ONDANSETRON HCL 4 MG/2ML IJ SOLN
INTRAMUSCULAR | Status: DC | PRN
  Administered 2018-11-04: 4 mg via INTRAVENOUS

## 2018-11-04 MED ORDER — PHENYLEPHRINE 40 MCG/ML (10ML) SYRINGE FOR IV PUSH (FOR BLOOD PRESSURE SUPPORT)
PREFILLED_SYRINGE | INTRAVENOUS | Status: AC
  Filled 2018-11-04: qty 20

## 2018-11-04 MED ORDER — DEXAMETHASONE SODIUM PHOSPHATE 10 MG/ML IJ SOLN
INTRAMUSCULAR | Status: DC | PRN
  Administered 2018-11-04: 10 mg via INTRAVENOUS

## 2018-11-04 MED ORDER — ESMOLOL HCL 100 MG/10ML IV SOLN
INTRAVENOUS | Status: DC | PRN
  Administered 2018-11-04: 30 mg via INTRAVENOUS

## 2018-11-04 MED ORDER — PROPOFOL 10 MG/ML IV BOLUS
INTRAVENOUS | Status: DC | PRN
  Administered 2018-11-04: 50 mg via INTRAVENOUS

## 2018-11-04 MED ORDER — LISINOPRIL 20 MG PO TABS
20.0000 mg | ORAL_TABLET | Freq: Every day | ORAL | Status: DC
  Administered 2018-11-05: 10:00:00 20 mg via ORAL
  Filled 2018-11-04: qty 1

## 2018-11-04 MED ORDER — DOCUSATE SODIUM 100 MG PO CAPS
100.0000 mg | ORAL_CAPSULE | Freq: Two times a day (BID) | ORAL | Status: DC
  Administered 2018-11-04 – 2018-11-05 (×2): 100 mg via ORAL
  Filled 2018-11-04 (×2): qty 1

## 2018-11-04 MED ORDER — LIDOCAINE-EPINEPHRINE 1 %-1:100000 IJ SOLN
INTRAMUSCULAR | Status: AC
  Filled 2018-11-04: qty 1

## 2018-11-04 MED ORDER — HYDROMORPHONE HCL 1 MG/ML IJ SOLN
0.2500 mg | INTRAMUSCULAR | Status: DC | PRN
  Administered 2018-11-04 (×4): 0.5 mg via INTRAVENOUS

## 2018-11-04 MED ORDER — BUPIVACAINE HCL (PF) 0.5 % IJ SOLN
INTRAMUSCULAR | Status: DC | PRN
  Administered 2018-11-04: 5 mL

## 2018-11-04 MED ORDER — AMLODIPINE BESYLATE 5 MG PO TABS
5.0000 mg | ORAL_TABLET | Freq: Every day | ORAL | Status: DC
  Administered 2018-11-05: 5 mg via ORAL
  Filled 2018-11-04: qty 1

## 2018-11-04 MED ORDER — FLEET ENEMA 7-19 GM/118ML RE ENEM: 1.0000 | ENEMA | Freq: Once | RECTAL | Status: DC | PRN

## 2018-11-04 MED ORDER — LIDOCAINE-EPINEPHRINE 1 %-1:100000 IJ SOLN
INTRAMUSCULAR | Status: DC | PRN
  Administered 2018-11-04: 5 mL

## 2018-11-04 MED ORDER — ROCURONIUM BROMIDE 10 MG/ML (PF) SYRINGE
PREFILLED_SYRINGE | INTRAVENOUS | Status: AC
  Filled 2018-11-04: qty 10

## 2018-11-04 MED ORDER — HYDROCODONE-ACETAMINOPHEN 5-325 MG PO TABS
1.0000 | ORAL_TABLET | ORAL | Status: DC | PRN
  Administered 2018-11-04: 23:00:00 2 via ORAL
  Administered 2018-11-05: 10:00:00 1 via ORAL
  Filled 2018-11-04: qty 1
  Filled 2018-11-04: qty 2

## 2018-11-04 MED ORDER — HYDROCODONE-ACETAMINOPHEN 7.5-325 MG PO TABS
ORAL_TABLET | ORAL | Status: AC
  Filled 2018-11-04: qty 1

## 2018-11-04 MED ORDER — PHENOL 1.4 % MT LIQD: 1.0000 | OROMUCOSAL | Status: DC | PRN

## 2018-11-04 MED ORDER — MIDAZOLAM HCL 2 MG/2ML IJ SOLN
INTRAMUSCULAR | Status: AC
  Filled 2018-11-04: qty 2

## 2018-11-04 MED ORDER — ONDANSETRON HCL 4 MG PO TABS: 4.0000 mg | ORAL_TABLET | Freq: Four times a day (QID) | ORAL | Status: DC | PRN

## 2018-11-04 MED ORDER — ONDANSETRON HCL 4 MG/2ML IJ SOLN
INTRAMUSCULAR | Status: AC
  Filled 2018-11-04: qty 6

## 2018-11-04 MED ORDER — BUPIVACAINE HCL (PF) 0.5 % IJ SOLN
INTRAMUSCULAR | Status: AC
  Filled 2018-11-04: qty 30

## 2018-11-04 MED ORDER — CEFAZOLIN SODIUM-DEXTROSE 2-4 GM/100ML-% IV SOLN
2.0000 g | Freq: Three times a day (TID) | INTRAVENOUS | Status: AC
  Administered 2018-11-04 – 2018-11-05 (×2): 2 g via INTRAVENOUS
  Filled 2018-11-04 (×2): qty 100

## 2018-11-04 SURGICAL SUPPLY — 48 items
ALLOGRAFT LORDOTIC 8X11X14 (Bone Implant) ×2 IMPLANT
BAG DECANTER FOR FLEXI CONT (MISCELLANEOUS) ×2 IMPLANT
BIT DRILL NEURO 2X3.1 SFT TUCH (MISCELLANEOUS) ×1 IMPLANT
BIT DRILL POWER (BIT) ×1 IMPLANT
BNDG GAUZE ELAST 4 BULKY (GAUZE/BANDAGES/DRESSINGS) IMPLANT
BUR BARREL STRAIGHT FLUTE 4.0 (BURR) IMPLANT
CANISTER SUCT 3000ML PPV (MISCELLANEOUS) ×2 IMPLANT
COVER WAND RF STERILE (DRAPES) ×2 IMPLANT
DECANTER SPIKE VIAL GLASS SM (MISCELLANEOUS) ×2 IMPLANT
DERMABOND ADVANCED (GAUZE/BANDAGES/DRESSINGS) ×1
DERMABOND ADVANCED .7 DNX12 (GAUZE/BANDAGES/DRESSINGS) ×1 IMPLANT
DRAPE LAPAROTOMY 100X72 PEDS (DRAPES) ×2 IMPLANT
DRAPE MICROSCOPE LEICA (MISCELLANEOUS) IMPLANT
DRILL BIT POWER (BIT) ×1
DRILL NEURO 2X3.1 SOFT TOUCH (MISCELLANEOUS) ×2
DURAPREP 6ML APPLICATOR 50/CS (WOUND CARE) ×2 IMPLANT
ELECT BLADE 4.0 EZ CLEAN MEGAD (MISCELLANEOUS) ×2
ELECT REM PT RETURN 9FT ADLT (ELECTROSURGICAL) ×2
ELECTRODE BLDE 4.0 EZ CLN MEGD (MISCELLANEOUS) ×1 IMPLANT
ELECTRODE REM PT RTRN 9FT ADLT (ELECTROSURGICAL) ×1 IMPLANT
GAUZE 4X4 16PLY RFD (DISPOSABLE) IMPLANT
GLOVE BIOGEL PI IND STRL 7.5 (GLOVE) ×4 IMPLANT
GLOVE BIOGEL PI IND STRL 8.5 (GLOVE) ×1 IMPLANT
GLOVE BIOGEL PI INDICATOR 7.5 (GLOVE) ×4
GLOVE BIOGEL PI INDICATOR 8.5 (GLOVE) ×1
GLOVE ECLIPSE 8.5 STRL (GLOVE) ×2 IMPLANT
GLOVE SS N UNI LF 7.0 STRL (GLOVE) ×6 IMPLANT
GOWN STRL REUS W/ TWL LRG LVL3 (GOWN DISPOSABLE) IMPLANT
GOWN STRL REUS W/ TWL XL LVL3 (GOWN DISPOSABLE) ×1 IMPLANT
GOWN STRL REUS W/TWL 2XL LVL3 (GOWN DISPOSABLE) ×2 IMPLANT
GOWN STRL REUS W/TWL LRG LVL3 (GOWN DISPOSABLE)
GOWN STRL REUS W/TWL XL LVL3 (GOWN DISPOSABLE) ×1
HALTER HD/CHIN CERV TRACTION D (MISCELLANEOUS) ×2 IMPLANT
HEMOSTAT POWDER KIT SURGIFOAM (HEMOSTASIS) ×2 IMPLANT
KIT BASIN OR (CUSTOM PROCEDURE TRAY) ×2 IMPLANT
NEEDLE HYPO 22GX1.5 SAFETY (NEEDLE) ×2 IMPLANT
NEEDLE SPNL 22GX3.5 QUINCKE BK (NEEDLE) ×2 IMPLANT
NS IRRIG 1000ML POUR BTL (IV SOLUTION) ×2 IMPLANT
PACK LAMINECTOMY NEURO (CUSTOM PROCEDURE TRAY) ×2 IMPLANT
PAD ARMBOARD 7.5X6 YLW CONV (MISCELLANEOUS) ×6 IMPLANT
PLATE ARCHON 24MM 1LVL (Plate) ×2 IMPLANT
RUBBERBAND STERILE (MISCELLANEOUS) IMPLANT
SCREW ARCHON SELFTAP 4.0X13 (Screw) ×8 IMPLANT
SPONGE INTESTINAL PEANUT (DISPOSABLE) ×2 IMPLANT
SUT VIC AB 4-0 RB1 18 (SUTURE) ×2 IMPLANT
TOWEL GREEN STERILE (TOWEL DISPOSABLE) ×2 IMPLANT
TOWEL GREEN STERILE FF (TOWEL DISPOSABLE) ×2 IMPLANT
WATER STERILE IRR 1000ML POUR (IV SOLUTION) ×2 IMPLANT

## 2018-11-04 NOTE — Anesthesia Postprocedure Evaluation (Signed)
Anesthesia Post Note  Patient: Aaron Castro  Procedure(s) Performed: Cervical Three-Four Anterior Cervical Decompression/Discectomy/Fusion (N/A Spine Cervical)     Patient location during evaluation: PACU Anesthesia Type: General Level of consciousness: awake and alert Pain management: pain level controlled Vital Signs Assessment: post-procedure vital signs reviewed and stable Respiratory status: spontaneous breathing, nonlabored ventilation and respiratory function stable Cardiovascular status: blood pressure returned to baseline and stable Postop Assessment: no apparent nausea or vomiting Anesthetic complications: no    Last Vitals:  Vitals:   11/04/18 1735 11/04/18 1740  BP: (!) 156/73 (!) 147/57  Pulse: 89 91  Resp: 19 17  Temp:    SpO2: (!) 87% 100%    Last Pain:  Vitals:   11/04/18 1452  TempSrc:   PainSc: 6                  Tremane Spurgeon,W. EDMOND

## 2018-11-04 NOTE — Anesthesia Preprocedure Evaluation (Signed)
Anesthesia Evaluation  Patient identified by MRN, date of birth, ID band Patient awake    Reviewed: Allergy & Precautions, NPO status , Patient's Chart, lab work & pertinent test results  Airway Mallampati: III  TM Distance: >3 FB Neck ROM: Full    Dental  (+) Teeth Intact, Missing   Pulmonary shortness of breath, former smoker,    Pulmonary exam normal breath sounds clear to auscultation       Cardiovascular hypertension, Pt. on medications Normal cardiovascular exam Rhythm:Regular Rate:Normal     Neuro/Psych Cervical myelopathy negative psych ROS   GI/Hepatic negative GI ROS, Neg liver ROS,   Endo/Other  Morbid obesityHyperlipidemia  Renal/GU negative Renal ROS   ED    Musculoskeletal  (+) Arthritis , Osteoarthritis,  Cervical myelopathy   Abdominal (+) + obese,   Peds  Hematology negative hematology ROS (+)   Anesthesia Other Findings   Reproductive/Obstetrics                             Anesthesia Physical Anesthesia Plan  ASA: III  Anesthesia Plan: General   Post-op Pain Management:    Induction: Intravenous  PONV Risk Score and Plan: 3 and Ondansetron, Treatment may vary due to age or medical condition and Midazolam  Airway Management Planned: Oral ETT  Additional Equipment:   Intra-op Plan:   Post-operative Plan: Extubation in OR  Informed Consent: I have reviewed the patients History and Physical, chart, labs and discussed the procedure including the risks, benefits and alternatives for the proposed anesthesia with the patient or authorized representative who has indicated his/her understanding and acceptance.     Dental advisory given  Plan Discussed with: CRNA and Surgeon  Anesthesia Plan Comments:         Anesthesia Quick Evaluation

## 2018-11-04 NOTE — Anesthesia Procedure Notes (Addendum)
Procedure Name: Intubation Date/Time: 11/04/2018 3:32 PM Performed by: Modena Morrow, CRNA Pre-anesthesia Checklist: Patient identified, Emergency Drugs available, Suction available, Patient being monitored and Timeout performed Patient Re-evaluated:Patient Re-evaluated prior to induction Oxygen Delivery Method: Circle system utilized Preoxygenation: Pre-oxygenation with 100% oxygen Induction Type: IV induction, Rapid sequence and Cricoid Pressure applied Laryngoscope Size: Miller and 2 Grade View: Grade I Tube type: Oral Tube size: 7.5 mm Number of attempts: 1 Airway Equipment and Method: Stylet Placement Confirmation: ETT inserted through vocal cords under direct vision,  positive ETCO2 and breath sounds checked- equal and bilateral Secured at: 22 cm Tube secured with: Tape Dental Injury: Teeth and Oropharynx as per pre-operative assessment
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
## 2018-11-04 NOTE — Transfer of Care (Signed)
Immediate Anesthesia Transfer of Care Note  Patient: Aaron Castro  Procedure(s) Performed: Cervical Three-Four Anterior Cervical Decompression/Discectomy/Fusion (N/A Spine Cervical)  Patient Location: PACU  Anesthesia Type:General  Level of Consciousness: awake, alert , oriented and patient cooperative  Airway & Oxygen Therapy: Patient Spontanous Breathing and Patient connected to face mask oxygen  Post-op Assessment: Report given to RN and Post -op Vital signs reviewed and stable  Post vital signs: Reviewed and stable  Last Vitals:  Vitals Value Taken Time  BP 152/122 11/04/2018  5:11 PM  Temp    Pulse 88 11/04/2018  5:12 PM  Resp 21 11/04/2018  5:12 PM  SpO2 99 % 11/04/2018  5:12 PM  Vitals shown include unvalidated device data.  Last Pain:  Vitals:   11/04/18 1452  TempSrc:   PainSc: 6       Patients Stated Pain Goal: 3 (11/04/18 1159)  Complications: No apparent anesthesia complications

## 2018-11-04 NOTE — Op Note (Signed)
Date of surgery: 11/04/2018  Preoperative diagnosis: Herniated nucleus pulposus C3-C4 with myelopathy  Postoperative diagnosis: Cervical goal herniated nucleus pulposus C3-C4 with myelopathy  Procedure: C3-4 anterior cervical decompression with the arthrodesis using structural  allograft with demineralized bone matrix. Anterior fixation using NuVasive arc on 24 mm plate Triad CC 8 x 14 x 11 7 degree lordotic bone graft 15 mm variable angle screws Surgeon: Barnett Abu  Anesthesia: Gen. endotracheal  Estimated blood loss: 50 mL  Drains: None  Complications: None  Indications: Patient is a 68 year old individual who had developed rather sudden weakness in all 4 extremities he had a fall at home and work-up in the emergency room demonstrated a severe stenosis at L3-L4 however he noted that he had weakness in his arms also and a subsequent MRI of the cervical spine demonstrates a severe stenosis at C3-C4 with myelopathic cord signal changes.  He is now taken to the operating room to undergo surgical decompression.  Procedure: The patient was brought to the operating room on a stretcher was placed on the table in the supine position. General endotracheal anesthesia was induced the anterior border of the neck was inspected prepped with alcohol and DuraPrep and draped in a sterile fashion. Transverse incision was created in the anterior border of the neck and carried down through the platysma. The plane between the sternocleidomastoid and strap muscles dissected bluntly until the first prevertebral space was reached. This space was identified as C3-4 on the radiograph. The dissection was then completed undermining longus coli muscle to allow placement of a self-retaining Caspar retractor. The anterior longitudinal ligament was then opened with a #15 blade and ventral osteophytes removed using a Financial planner. The disc space was evacuated of substantial quantity of the degenerated and  desiccated disc material. Region of the posterior longitudinal ligament was reached and a self-retaining disc space spreader was placed into the interspace. A high-speed drill was used to remove bony osteophytes from the inferior margin of the superior endplate and also to remove overgrown lateral osteophytes and the uncinate processes. The nerve roots were decompressed using a 1 and 2 mm Kerrison punch. Hemostasis was established using small pledgets of Gelfoam soaked in thrombin and cottonoid patties. These were later irrigated away. The endplates were drilled smoothed with a 4 mm barrel bit.  An 8 mm cortical cancellous triad allograft measuring 8 x 14 x 11 mm with a 7 degrees lordosis was  placed in the interspace. The anterior border of the vertebrae was then prepared for plating. A 24 mm arcon plate was secured to the vertebral bodies with 15 millimeters screws. The vertebral hemostasis was then checked and secured with the bipolar cautery and Gelfoam soaked pledgets. These were later irrigated away. A final radiograph was obtained confirming position of the hardware. When hemostasis was secured the platysma was closed with 3-0 Vicryl and 3-0 Vicryl is used to close subcuticular skin.

## 2018-11-05 ENCOUNTER — Encounter (HOSPITAL_COMMUNITY): Payer: Self-pay | Admitting: Neurological Surgery

## 2018-11-05 DIAGNOSIS — M4712 Other spondylosis with myelopathy, cervical region: Secondary | ICD-10-CM | POA: Diagnosis not present

## 2018-11-05 DIAGNOSIS — Z87891 Personal history of nicotine dependence: Secondary | ICD-10-CM | POA: Diagnosis not present

## 2018-11-05 DIAGNOSIS — I10 Essential (primary) hypertension: Secondary | ICD-10-CM | POA: Diagnosis not present

## 2018-11-05 DIAGNOSIS — M5001 Cervical disc disorder with myelopathy,  high cervical region: Secondary | ICD-10-CM | POA: Diagnosis not present

## 2018-11-05 MED ORDER — HYDROCODONE-ACETAMINOPHEN 5-325 MG PO TABS: 1.0000 | ORAL_TABLET | ORAL | 0 refills | Status: DC | PRN

## 2018-11-05 MED ORDER — DIAZEPAM 5 MG PO TABS: 5.0000 mg | ORAL_TABLET | Freq: Four times a day (QID) | ORAL | 0 refills | Status: DC | PRN

## 2018-11-05 NOTE — Discharge Summary (Signed)
Physician Discharge Summary  Patient ID: Aaron Castro MRN: [id number] DOB/AGE: 70/28/1952 70 y.o.  Admit date: 11/04/2018 Discharge date: 11/05/2018  Admission Diagnoses: Cervical myelopathy C3-C4, morbid obesity  Discharge Diagnoses: Cervical myelopathy C3-C4, morbid obesity Active Problems:   Cervical myelopathy [facility])   Discharged Condition: fair  Hospital Course: Patient was admitted to undergo surgical decompression at C3-C4 for spinal cord compression with weakness in all 4 extremities.  He feels better postoperatively.  He is ambulatory with assistance.  He is discharged home  Consults: None  Significant Diagnostic Studies: None  Treatments: surgery: Anterior cervical decompression and fusion C3-C4  Discharge Exam: Blood pressure (!) 152/91, pulse 85, temperature 97.8 F (36.6 C), temperature source Oral, resp. rate 16, height 5\' 11"  (1.803 m), weight (!) 170 kg, SpO2 95 %. Incision is clean and dry.  Motor function is revealing 4 out of 5 strength in the proximal upper extremities deltoids biceps triceps grips and intrinsics 4+ out of 5 strength in the lower extremities.  Is brisk hyper reflexive with positive Babinski's in the lower extremities.  Disposition: Discharge disposition: 01-Home or Self Care       Discharge Instructions    Call MD for:  redness, tenderness, or signs of infection (pain, swelling, redness, odor or green/yellow discharge around incision site)   Complete by:  As directed    Call MD for:  severe uncontrolled pain   Complete by:  As directed    Call MD for:  temperature >100.4   Complete by:  As directed    Diet - low sodium heart healthy   Complete by:  As directed    Incentive spirometry RT   Complete by:  As directed    Increase activity slowly   Complete by:  As directed      Allergies as of 11/05/2018   No Known Allergies     Medication List    TAKE these medications   acetaminophen 500 MG tablet Commonly known as:   TYLENOL Take 500 mg by mouth every 6 (six) hours as needed (pain).   amLODipine 5 MG tablet Commonly known as:  NORVASC TAKE 1 TABLET(5 MG) BY MOUTH DAILY What changed:  See the new instructions.   diazepam 5 MG tablet Commonly known as:  Valium Take 1 tablet (5 mg total) by mouth every 6 (six) hours as needed for anxiety or muscle spasms.   diclofenac 75 MG EC tablet Commonly known as:  VOLTAREN TAKE 1 TABLET(75 MG) BY MOUTH TWICE DAILY What changed:  See the new instructions.   finasteride 5 MG tablet Commonly known as:  PROSCAR TAKE 1 TABLET(5 MG) BY MOUTH DAILY What changed:  See the new instructions.   gabapentin 600 MG tablet Commonly known as:  Neurontin Take 1 tablet (600 mg total) by mouth 3 (three) times daily.   HYDROcodone-acetaminophen 5-325 MG tablet Commonly known as:  NORCO/VICODIN Take 1-2 tablets by mouth every 4 (four) hours as needed for moderate pain or severe pain ((score 7 to 10)). What changed:    how much to take  when to take this  reasons to take this   lisinopril-hydrochlorothiazide 20-25 MG tablet Commonly known as:  ZESTORETIC TAKE 1 TABLET BY MOUTH DAILY   methocarbamol 500 MG tablet Commonly known as:  ROBAXIN Take 1 tablet (500 mg total) by mouth 2 (two) times daily as needed for muscle spasms.   polyethylene glycol 17 g packet Commonly known as:  MIRALAX / GLYCOLAX Take 17 g by mouth  daily as needed (constipation).   sildenafil 20 MG tablet Commonly known as:  REVATIO TAKE UP TO 5 TABLETS BY MOUTH ONCE TO HELP WITH ERECTIONS What changed:    how much to take  how to take this  when to take this  additional instructions   traMADol 100 MG 24 hr tablet Commonly known as:  ULTRAM-ER Take 100 mg by mouth 3 (three) times daily as needed for pain.   traMADol 50 MG tablet Commonly known as:  ULTRAM Take 1 tablet (50 mg total) by mouth every 6 (six) hours as needed.   traMADol 50 MG tablet Commonly known as:  ULTRAM Take  2 tablets (100 mg total) by mouth 3 (three) times daily as needed (pain).        Signed: Shary KeyHenry J Jovanni Eckhart 11/05/2018, 9:32 AM

## 2018-11-05 NOTE — Social Work (Signed)
CSW acknowledging consult for SNF placement. Will follow for therapy recommendations.   Brylei Pedley, MSW, LCSWA  Clinical Social Work [phone]   

## 2018-11-05 NOTE — TOC Transition Note (Signed)
Transition of Care [facility]) - CM/SW Discharge Note   Patient Details  Name: Aaron Castro MRN: [id number] Date of Birth: 06-24-1951  Transition of Care [facility] CM/SW Contact:  Glennon Mac, RN Phone Number: 11/05/2018, 5:09 PM   Clinical Narrative:   Pt is a 68 y.o. M with significant PMH of previous TBI, obesity, HTN, who presents s/p surgical decompression at C3-4 for spinal cord compression.  PTA, pt needs assistance with ADLS; has friend and brother who assist him intermittently.  PT/OT recommending HH follow up, and pt agreeable to services.  Referral to [facility] per pt choice; start of care 24-48h post dc date.  No DME needs, per pt.     Final next level of care: Home w Home Health Services Barriers to Discharge: No Barriers Identified   Patient Goals and CMS Choice Patient states their goals for this hospitalization and ongoing recovery are:: To get rid of this pain CMS Medicare.gov Compare Post Acute Care list provided to:: Patient Choice offered to / list presented to : Patient                        Discharge Plan and Services   Discharge Planning Services: CM Consult Post Acute Care Choice: Home Health                    HH Arranged: PT, OT [facility] Agency: [facility] Date [facility] Agency Contacted: 11/05/18 Time HH Agency Contacted: 1014 Representative spoke with at [facility] Agency: Becky Sax   Readmission Risk Interventions Readmission Risk Prevention Plan 11/05/2018  Post Dischage Appt Complete  Medication Screening Complete  Transportation Screening Complete  Some recent data might be hidden   Quintella Baton, RN, BSN  Trauma/Neuro ICU Case Manager [phone]

## 2018-11-05 NOTE — Evaluation (Signed)
Physical Therapy Evaluation Patient Details Name: Aaron SermonsJames E Castro MRN: [id number] DOB: 1951/02/23 Today's Date: 11/05/2018   History of Present Illness  Pt is a 68 y.o. M with significant PMH of previous TBI, obesity, HTN, who presents s/p surgical decompression at C3-4 for spinal cord compression.  Clinical Impression  Patient is s/p above surgery resulting in the deficits listed below (see PT Problem List). Prior to admission, pt is a limited household ambulator using a walker. On PT evaluation, pt reporting good pain control and ambulating x 100 feet with walker and min assist. Presents with gait abnormalities, balance impairments, and decreased activity tolerance. Education re: cervical precautions. Patient will benefit from skilled PT to increase their independence and safety with mobility (while adhering to their precautions) to allow discharge to the venue listed below.     Follow Up Recommendations Home health PT;Supervision for mobility/OOB    Equipment Recommendations  None recommended by PT    Recommendations for Other Services       Precautions / Restrictions Precautions Precautions: Cervical;Fall Precaution Booklet Issued: Yes (comment) Restrictions Weight Bearing Restrictions: No      Mobility  Bed Mobility Overal bed mobility: Needs Assistance Bed Mobility: Sidelying to Sit;Sit to Sidelying   Sidelying to sit: Supervision     Sit to sidelying: Supervision General bed mobility comments: able to perform log roll technique. increased time and effort with use of bed rail  Transfers Overall transfer level: Needs assistance Equipment used: Rolling walker (2 wheeled) Transfers: Sit to/from Stand Sit to Stand: Min guard         General transfer comment: cues for hand placement  Ambulation/Gait Ambulation/Gait assistance: Min assist Gait Distance (Feet): 100 Feet Assistive device: Rolling walker (2 wheeled) Gait Pattern/deviations: Step-through  pattern;Decreased stride length;Wide base of support;Trunk flexed Gait velocity: decreased Gait velocity interpretation: <1.8 ft/sec, indicate of risk for recurrent falls General Gait Details: Trunk flexed posture throughout, able to correct somewhat with cues. Fatigues easily. One episode of lateral LOB requiring min assist to correct  Stairs            Wheelchair Mobility    Modified Rankin (Stroke Patients Only)       Balance Overall balance assessment: Needs assistance Sitting-balance support: Feet supported Sitting balance-Leahy Scale: Good     Standing balance support: Bilateral upper extremity supported;During functional activity Standing balance-Leahy Scale: Poor Standing balance comment: reliant on external support                             Pertinent Vitals/Pain Pain Assessment: No/denies pain    Home Living Family/patient expects to be discharged to:: Private residence Living Arrangements: Alone Available Help at Discharge: Family;Available PRN/intermittently(brother) Type of Home: House Home Access: Level entry     Home Layout: One level Home Equipment: Walker - 2 wheels;Cane - single point      Prior Function Level of Independence: Needs assistance   Gait / Transfers Assistance Needed: uses walker  ADL's / Homemaking Assistance Needed: brother assists with IADL's        Hand Dominance   Dominant Hand: Right    Extremity/Trunk Assessment   Upper Extremity Assessment Upper Extremity Assessment: RUE deficits/detail;LUE deficits/detail RUE Deficits / Details: Shoulder AROM limited to ~90 LUE Deficits / Details: Shoulder AROM limited to ~90    Lower Extremity Assessment Lower Extremity Assessment: RLE deficits/detail;LLE deficits/detail RLE Deficits / Details: Strength grossly 5/5 except hip flexion 4/5 LLE Deficits /  Details: Strength grossly 5/5 except hip flexion 4/5    Cervical / Trunk Assessment Cervical / Trunk  Assessment: Other exceptions Cervical / Trunk Exceptions: increased body habitus  Communication   Communication: No difficulties  Cognition Arousal/Alertness: Awake/alert Behavior During Therapy: WFL for tasks assessed/performed Overall Cognitive Status: Within Functional Limits for tasks assessed                                        General Comments      Exercises     Assessment/Plan    PT Assessment Patient needs continued PT services  PT Problem List Decreased strength;Decreased activity tolerance;Decreased balance;Decreased mobility;Obesity       PT Treatment Interventions DME instruction;Gait training;Functional mobility training;Therapeutic activities;Therapeutic exercise;Balance training;Patient/family education    PT Goals (Current goals can be found in the Care Plan section)  Acute Rehab PT Goals Patient Stated Goal: none stated; agreeable to therapy PT Goal Formulation: With patient Time For Goal Achievement: 11/19/18 Potential to Achieve Goals: Good    Frequency Min 5X/week   Barriers to discharge        Co-evaluation               AM-PAC PT "6 Clicks" Mobility  Outcome Measure Help needed turning from your back to your side while in a flat bed without using bedrails?: None Help needed moving from lying on your back to sitting on the side of a flat bed without using bedrails?: None Help needed moving to and from a bed to a chair (including a wheelchair)?: A Little Help needed standing up from a chair using your arms (e.g., wheelchair or bedside chair)?: A Little Help needed to walk in hospital room?: A Little Help needed climbing 3-5 steps with a railing? : A Lot 6 Click Score: 19    End of Session   Activity Tolerance: Patient tolerated treatment well Patient left: in bed;with call bell/phone within reach Nurse Communication: Mobility status PT Visit Diagnosis: Unsteadiness on feet (R26.81);Difficulty in walking, not elsewhere  classified (R26.2)    Time: 9038-3338 PT Time Calculation (min) (ACUTE ONLY): 21 min   Charges:   PT Evaluation $PT Eval Moderate Complexity: 1 Mod         Laurina Bustle, [place], DPT Acute Rehabilitation Services Pager [phone] Office [phone]   Vanetta Mulders 11/05/2018, 10:15 AM

## 2018-11-05 NOTE — Care Management CC44 (Signed)
Condition Code 44 Documentation Completed  Patient Details  Name: Aaron Castro MRN: [id number] Date of Birth: [birth year]   Condition Code 44 given:  Yes Patient signature on Condition Code 44 notice:  Yes Documentation of 2 MD's agreement:  Yes Code 44 added to claim:  Yes    Glennon Mac, RN 11/05/2018, 10:00 AM

## 2018-11-05 NOTE — Evaluation (Addendum)
Occupational Therapy Evaluation Patient Details Name: Aaron SermonsJames E Castro MRN: [id number] DOB: [birth year] Today's Date: 11/05/2018    History of Present Illness Pt is a 68 y.o. M with significant PMH of previous TBI, obesity, HTN, who presents s/p surgical decompression at C3-4 for spinal cord compression.   Clinical Impression   PTA patient reports using RW for mobility, needing supervision for bathing in shower and assist for socks/shoes but otherwise able to complete ADLs without assist, and required assist with IADLs from his brother/friend.  Patient educated on precautions, ADL compensatory techniques and recommendations.  Albe to recall precautions, but required cueing to adhere to throughout functional tasks. He requires min assist for UB ADLs, mod assist for LB ADLs, min guard for toilet transfers and mod assist for toileting.  He will benefit from continued OT services while [facility] to maximize independence and safety with ADLs/mobility.     Follow Up Recommendations  Home health OT;Supervision - Intermittent    Equipment Recommendations  3 in 1 bedside commode    Recommendations for Other Services       Precautions / Restrictions Precautions Precautions: Cervical;Fall Precaution Booklet Issued: Yes (comment) Precaution Comments: reviewed precautions with pt, able to recall but requires cueing to adhere to during funcitonal tasks  Restrictions Weight Bearing Restrictions: No      Mobility Bed Mobility Overal bed mobility: Needs Assistance Bed Mobility: Rolling;Sidelying to Sit;Sit to Sidelying Rolling: Supervision Sidelying to sit: Supervision     Sit to sidelying: Supervision General bed mobility comments: cueing for correct log roll technique with increased time and effort  Transfers Overall transfer level: Needs assistance Equipment used: Rolling walker (2 wheeled) Transfers: Sit to/from Stand Sit to Stand: Min guard          General transfer comment: cues for hand placement    Balance Overall balance assessment: Needs assistance Sitting-balance support: Feet supported Sitting balance-Leahy Scale: Good     Standing balance support: Bilateral upper extremity supported;During functional activity Standing balance-Leahy Scale: Poor Standing balance comment: reliant on external support                           ADL either performed or assessed with clinical judgement   ADL Overall ADL's : Needs assistance/impaired     Grooming: Set up;Sitting   Upper Body Bathing: Sitting;Minimal assistance   Lower Body Bathing: Sit to/from stand;Minimal assistance   Upper Body Dressing : Minimal assistance;Sitting   Lower Body Dressing: Moderate assistance;Sit to/from stand Lower Body Dressing Details (indicate cue type and reason): unable to complete figure 4 technique, but has assist with donning socks at home  Toilet Transfer: Min guard;Ambulation;RW   Toileting- Clothing Manipulation and Hygiene: Moderate assistance;Sit to/from stand       Functional mobility during ADLs: Rolling walker;Min guard General ADL Comments: pt limited by body habitus, impaired balance and precautions      Vision Baseline Vision/History: Wears glasses Wears Glasses: At all times Patient Visual Report: No change from baseline Vision Assessment?: No apparent visual deficits     Perception     Praxis      Pertinent Vitals/Pain Pain Assessment: Faces Faces Pain Scale: Hurts a little bit Pain Location: thorat, when swallowing  Pain Descriptors / Indicators: Discomfort Pain Intervention(s): Limited activity within patient's tolerance     Hand Dominance Right   Extremity/Trunk Assessment Upper Extremity Assessment Upper Extremity Assessment: LUE deficits/detail;RUE deficits/detail RUE Deficits /  Details: Shoulder AROM limited to ~90 LUE Deficits / Details: Shoulder AROM limited to ~90   Lower Extremity  Assessment Lower Extremity Assessment: Defer to PT evaluation RLE Deficits / Details: Strength grossly 5/5 except hip flexion 4/5 LLE Deficits / Details: Strength grossly 5/5 except hip flexion 4/5   Cervical / Trunk Assessment Cervical / Trunk Assessment: Other exceptions Cervical / Trunk Exceptions: increased body habitus, s/p ACDF   Communication Communication Communication: No difficulties   Cognition Arousal/Alertness: Awake/alert Behavior During Therapy: WFL for tasks assessed/performed Overall Cognitive Status: Within Functional Limits for tasks assessed                                     General Comments       Exercises     Shoulder Instructions      Home Living Family/patient expects to be discharged to:: Private residence Living Arrangements: Alone Available Help at Discharge: Family;Available PRN/intermittently(brother, friend  ) Type of Home: House Home Access: Level entry     Home Layout: One level     Bathroom Shower/Tub: Chief Strategy Officer: Standard     Home Equipment: Environmental consultant - 2 wheels;Cane - single point;Bedside commode          Prior Functioning/Environment Level of Independence: Needs assistance  Gait / Transfers Assistance Needed: uses walker ADL's / Homemaking Assistance Needed: some assist ADLs (A to don socks, supervision for shower), brother assists with IADls    Comments: + driving         OT Problem List: Decreased strength;Decreased activity tolerance;Decreased range of motion;Decreased safety awareness;Decreased knowledge of use of DME or AE;Decreased knowledge of precautions;Pain      OT Treatment/Interventions: Self-care/ADL training;Therapeutic exercise;DME and/or AE instruction;Therapeutic activities;Patient/family education;Balance training    OT Goals(Current goals can be found in the care plan section) Acute Rehab OT Goals Patient Stated Goal: to get home today OT Goal Formulation: With  patient Time For Goal Achievement: 11/19/18 Potential to Achieve Goals: Good  OT Frequency: Min 2X/week   Barriers to D/C:            Co-evaluation              AM-PAC OT "6 Clicks" Daily Activity     Outcome Measure Help from another person eating meals?: None Help from another person taking care of personal grooming?: A Little Help from another person toileting, which includes using toliet, bedpan, or urinal?: Total Help from another person bathing (including washing, rinsing, drying)?: A Lot Help from another person to put on and taking off regular upper body clothing?: A Little Help from another person to put on and taking off regular lower body clothing?: A Lot 6 Click Score: 15   End of Session Equipment Utilized During Treatment: Rolling walker Nurse Communication: Mobility status  Activity Tolerance: Patient tolerated treatment well Patient left: in bed;with call bell/phone within reach  OT Visit Diagnosis: Other abnormalities of gait and mobility (R26.89);Muscle weakness (generalized) (M62.81);Pain Pain - part of body: (throat)                Time: 6314-9702 OT Time Calculation (min): 21 min Charges:  OT General Charges $OT Visit: 1 Visit OT Evaluation $OT Eval Low Complexity: 1 Low  Chancy Milroy, OT Acute Rehabilitation Services Pager [phone] Office [phone]   Chancy Milroy 11/05/2018, 12:09 PM

## 2018-11-05 NOTE — Care Management Obs Status (Signed)
MEDICARE OBSERVATION STATUS NOTIFICATION   Patient Details  Name: Aaron Castro MRN: [id number] Date of Birth: February 27, 1951   Medicare Observation Status Notification Given:  Yes    Glennon Mac, RN 11/05/2018, 10:00 AM

## 2018-11-05 NOTE — Progress Notes (Signed)
Patient ID: Aaron Castro, male   DOB: October 22, 1950, 68 y.o.   MRN: [id number] Vital signs are stable Motor function is good He is ambulatory with use of a walker We will discharge home today

## 2018-11-06 DIAGNOSIS — Z4789 Encounter for other orthopedic aftercare: Secondary | ICD-10-CM | POA: Diagnosis not present

## 2018-11-06 DIAGNOSIS — Z87891 Personal history of nicotine dependence: Secondary | ICD-10-CM | POA: Diagnosis not present

## 2018-11-06 DIAGNOSIS — M48061 Spinal stenosis, lumbar region without neurogenic claudication: Secondary | ICD-10-CM | POA: Diagnosis not present

## 2018-11-06 DIAGNOSIS — I10 Essential (primary) hypertension: Secondary | ICD-10-CM | POA: Diagnosis not present

## 2018-11-06 DIAGNOSIS — K5790 Diverticulosis of intestine, part unspecified, without perforation or abscess without bleeding: Secondary | ICD-10-CM | POA: Diagnosis not present

## 2018-11-06 DIAGNOSIS — M4712 Other spondylosis with myelopathy, cervical region: Secondary | ICD-10-CM | POA: Diagnosis not present

## 2018-11-06 DIAGNOSIS — M1990 Unspecified osteoarthritis, unspecified site: Secondary | ICD-10-CM | POA: Diagnosis not present

## 2018-11-10 DIAGNOSIS — M1990 Unspecified osteoarthritis, unspecified site: Secondary | ICD-10-CM | POA: Diagnosis not present

## 2018-11-10 DIAGNOSIS — I10 Essential (primary) hypertension: Secondary | ICD-10-CM | POA: Diagnosis not present

## 2018-11-10 DIAGNOSIS — M48061 Spinal stenosis, lumbar region without neurogenic claudication: Secondary | ICD-10-CM | POA: Diagnosis not present

## 2018-11-10 DIAGNOSIS — Z87891 Personal history of nicotine dependence: Secondary | ICD-10-CM | POA: Diagnosis not present

## 2018-11-10 DIAGNOSIS — Z4789 Encounter for other orthopedic aftercare: Secondary | ICD-10-CM | POA: Diagnosis not present

## 2018-11-10 DIAGNOSIS — K5790 Diverticulosis of intestine, part unspecified, without perforation or abscess without bleeding: Secondary | ICD-10-CM | POA: Diagnosis not present

## 2018-11-10 DIAGNOSIS — M4712 Other spondylosis with myelopathy, cervical region: Secondary | ICD-10-CM | POA: Diagnosis not present

## 2018-11-12 ENCOUNTER — Telehealth: Payer: Self-pay

## 2018-11-12 NOTE — Telephone Encounter (Signed)
Pt is receiving out side care and liz needed DX of hypertention conformation. KH

## 2018-11-13 DIAGNOSIS — Z87891 Personal history of nicotine dependence: Secondary | ICD-10-CM | POA: Diagnosis not present

## 2018-11-13 DIAGNOSIS — I10 Essential (primary) hypertension: Secondary | ICD-10-CM | POA: Diagnosis not present

## 2018-11-13 DIAGNOSIS — M1990 Unspecified osteoarthritis, unspecified site: Secondary | ICD-10-CM | POA: Diagnosis not present

## 2018-11-13 DIAGNOSIS — K5790 Diverticulosis of intestine, part unspecified, without perforation or abscess without bleeding: Secondary | ICD-10-CM | POA: Diagnosis not present

## 2018-11-13 DIAGNOSIS — M48061 Spinal stenosis, lumbar region without neurogenic claudication: Secondary | ICD-10-CM | POA: Diagnosis not present

## 2018-11-13 DIAGNOSIS — Z4789 Encounter for other orthopedic aftercare: Secondary | ICD-10-CM | POA: Diagnosis not present

## 2018-11-13 DIAGNOSIS — M4712 Other spondylosis with myelopathy, cervical region: Secondary | ICD-10-CM | POA: Diagnosis not present

## 2018-11-19 DIAGNOSIS — G959 Disease of spinal cord, unspecified: Secondary | ICD-10-CM | POA: Diagnosis not present

## 2018-11-20 ENCOUNTER — Telehealth: Payer: Self-pay | Admitting: Family Medicine

## 2018-11-20 ENCOUNTER — Other Ambulatory Visit: Payer: Self-pay | Admitting: Neurological Surgery

## 2018-11-20 MED ORDER — HYDROCODONE-ACETAMINOPHEN 5-325 MG PO TABS: 1.0000 | ORAL_TABLET | ORAL | 0 refills | Status: DC | PRN

## 2018-11-20 NOTE — Telephone Encounter (Signed)
Pt called requesting a refill on his Norco/Vicodin pt would like it sent to the [id number] - [place], [place] - [address] AT [facility] OF [address] pt has back surgery for June the 8th  Pt can be reached at [phone] with any questions

## 2018-11-21 DIAGNOSIS — Z4789 Encounter for other orthopedic aftercare: Secondary | ICD-10-CM | POA: Diagnosis not present

## 2018-11-21 DIAGNOSIS — K5790 Diverticulosis of intestine, part unspecified, without perforation or abscess without bleeding: Secondary | ICD-10-CM | POA: Diagnosis not present

## 2018-11-21 DIAGNOSIS — M4712 Other spondylosis with myelopathy, cervical region: Secondary | ICD-10-CM | POA: Diagnosis not present

## 2018-11-21 DIAGNOSIS — M1990 Unspecified osteoarthritis, unspecified site: Secondary | ICD-10-CM | POA: Diagnosis not present

## 2018-11-21 DIAGNOSIS — M48061 Spinal stenosis, lumbar region without neurogenic claudication: Secondary | ICD-10-CM | POA: Diagnosis not present

## 2018-11-21 DIAGNOSIS — Z87891 Personal history of nicotine dependence: Secondary | ICD-10-CM | POA: Diagnosis not present

## 2018-11-21 DIAGNOSIS — I10 Essential (primary) hypertension: Secondary | ICD-10-CM | POA: Diagnosis not present

## 2018-11-25 DIAGNOSIS — I10 Essential (primary) hypertension: Secondary | ICD-10-CM | POA: Diagnosis not present

## 2018-11-25 DIAGNOSIS — M4712 Other spondylosis with myelopathy, cervical region: Secondary | ICD-10-CM | POA: Diagnosis not present

## 2018-11-25 DIAGNOSIS — K5790 Diverticulosis of intestine, part unspecified, without perforation or abscess without bleeding: Secondary | ICD-10-CM | POA: Diagnosis not present

## 2018-11-25 DIAGNOSIS — M48061 Spinal stenosis, lumbar region without neurogenic claudication: Secondary | ICD-10-CM | POA: Diagnosis not present

## 2018-11-25 DIAGNOSIS — Z4789 Encounter for other orthopedic aftercare: Secondary | ICD-10-CM | POA: Diagnosis not present

## 2018-11-25 DIAGNOSIS — Z87891 Personal history of nicotine dependence: Secondary | ICD-10-CM | POA: Diagnosis not present

## 2018-11-25 DIAGNOSIS — M1990 Unspecified osteoarthritis, unspecified site: Secondary | ICD-10-CM | POA: Diagnosis not present

## 2018-11-27 DIAGNOSIS — Z87891 Personal history of nicotine dependence: Secondary | ICD-10-CM | POA: Diagnosis not present

## 2018-11-27 DIAGNOSIS — K5790 Diverticulosis of intestine, part unspecified, without perforation or abscess without bleeding: Secondary | ICD-10-CM | POA: Diagnosis not present

## 2018-11-27 DIAGNOSIS — Z4789 Encounter for other orthopedic aftercare: Secondary | ICD-10-CM | POA: Diagnosis not present

## 2018-11-27 DIAGNOSIS — M1990 Unspecified osteoarthritis, unspecified site: Secondary | ICD-10-CM | POA: Diagnosis not present

## 2018-11-27 DIAGNOSIS — M48061 Spinal stenosis, lumbar region without neurogenic claudication: Secondary | ICD-10-CM | POA: Diagnosis not present

## 2018-11-27 DIAGNOSIS — I10 Essential (primary) hypertension: Secondary | ICD-10-CM | POA: Diagnosis not present

## 2018-11-27 DIAGNOSIS — M4712 Other spondylosis with myelopathy, cervical region: Secondary | ICD-10-CM | POA: Diagnosis not present

## 2018-11-28 ENCOUNTER — Other Ambulatory Visit (HOSPITAL_COMMUNITY)
Admission: RE | Admit: 2018-11-28 | Discharge: 2018-11-28 | Disposition: A | Discharge status: Home / Self Care | Payer: Medicare Other | Source: Ambulatory Visit | Attending: Neurological Surgery | Admitting: Neurological Surgery

## 2018-11-28 ENCOUNTER — Encounter (HOSPITAL_COMMUNITY): Payer: Self-pay | Admitting: *Deleted

## 2018-11-28 ENCOUNTER — Other Ambulatory Visit: Payer: Self-pay

## 2018-11-28 DIAGNOSIS — Z01812 Encounter for preprocedural laboratory examination: Secondary | ICD-10-CM | POA: Insufficient documentation

## 2018-11-28 DIAGNOSIS — Z1159 Encounter for screening for other viral diseases: Secondary | ICD-10-CM | POA: Insufficient documentation

## 2018-11-28 MED ORDER — DEXTROSE 5 % IV SOLN
3.0000 g | INTRAVENOUS | Status: AC
  Administered 2018-12-01: 3 g via INTRAVENOUS
  Filled 2018-11-28 (×3): qty 3000

## 2018-11-28 NOTE — Progress Notes (Signed)
Patient informed of the Hospital Visitation Restriction Policy that is currently in effect.  Patient verbalized understanding.  Patient denies shortness of breath, fever, cough and chest pain.  PCP - Dr Susann Givens Cardiologist - Denies  Chest x-ray - 10/15 1 view EKG - 11/02/18 Stress Test - years ago- unknown location/doctor ECHO - denies Cardiac Cath - denies   Sleep Study - denies CPAP - n/a  Anesthesia review: Yes BMI 52.3    Coronavirus Screening Have you or your brother Aaron Castro experienced the following symptoms:  Cough yes/no: No Fever (>100.28F)  yes/no: No Runny nose yes/no: No Sore throat yes/no: No Difficulty breathing/shortness of breath  yes/no: No  Have you or a family member traveled in the last 14 days and where? yes/no: No  STOP now taking any Aspirin (unless otherwise instructed by your surgeon), Aleve, Naproxen, Ibuprofen, Diclofenac, Motrin, Advil, Goody's, BC's, all herbal medications, fish oil, and all vitamins.

## 2018-11-28 NOTE — Progress Notes (Signed)
Aaron Huger, PA chart for review.  BMI 52.3 and HTN.  SDW for Monday, 12/01/18.

## 2018-11-29 LAB — NOVEL CORONAVIRUS, NAA (HOSP ORDER, SEND-OUT TO REF LAB; TAT 18-24 HRS): SARS-CoV-2, NAA: NOT DETECTED

## 2018-12-01 ENCOUNTER — Encounter (HOSPITAL_COMMUNITY)
Admission: RE | Disposition: A | Discharge status: Home Health | Payer: Self-pay | Source: Home / Self Care | Attending: Neurological Surgery

## 2018-12-01 ENCOUNTER — Inpatient Hospital Stay (HOSPITAL_COMMUNITY): Payer: Medicare Other | Admitting: Physician Assistant

## 2018-12-01 ENCOUNTER — Encounter (HOSPITAL_COMMUNITY): Payer: Self-pay | Admitting: *Deleted

## 2018-12-01 ENCOUNTER — Inpatient Hospital Stay (HOSPITAL_COMMUNITY): Payer: Medicare Other

## 2018-12-01 ENCOUNTER — Other Ambulatory Visit: Payer: Self-pay

## 2018-12-01 ENCOUNTER — Inpatient Hospital Stay (HOSPITAL_COMMUNITY)
Admission: RE | Admit: 2018-12-01 | Discharge: 2018-12-03 | DRG: 460 | Disposition: A | Discharge status: Home Health | Payer: Medicare Other | Attending: Neurological Surgery | Admitting: Neurological Surgery

## 2018-12-01 DIAGNOSIS — Z841 Family history of disorders of kidney and ureter: Secondary | ICD-10-CM | POA: Diagnosis not present

## 2018-12-01 DIAGNOSIS — Z6841 Body Mass Index (BMI) 40.0 and over, adult: Secondary | ICD-10-CM

## 2018-12-01 DIAGNOSIS — G834 Cauda equina syndrome: Secondary | ICD-10-CM | POA: Diagnosis present

## 2018-12-01 DIAGNOSIS — M5416 Radiculopathy, lumbar region: Secondary | ICD-10-CM | POA: Diagnosis not present

## 2018-12-01 DIAGNOSIS — G959 Disease of spinal cord, unspecified: Secondary | ICD-10-CM | POA: Diagnosis not present

## 2018-12-01 DIAGNOSIS — Z419 Encounter for procedure for purposes other than remedying health state, unspecified: Secondary | ICD-10-CM

## 2018-12-01 DIAGNOSIS — K5790 Diverticulosis of intestine, part unspecified, without perforation or abscess without bleeding: Secondary | ICD-10-CM | POA: Diagnosis present

## 2018-12-01 DIAGNOSIS — Z811 Family history of alcohol abuse and dependence: Secondary | ICD-10-CM | POA: Diagnosis not present

## 2018-12-01 DIAGNOSIS — M1990 Unspecified osteoarthritis, unspecified site: Secondary | ICD-10-CM | POA: Diagnosis present

## 2018-12-01 DIAGNOSIS — M549 Dorsalgia, unspecified: Secondary | ICD-10-CM | POA: Diagnosis present

## 2018-12-01 DIAGNOSIS — M48062 Spinal stenosis, lumbar region with neurogenic claudication: Secondary | ICD-10-CM | POA: Diagnosis not present

## 2018-12-01 DIAGNOSIS — Z87891 Personal history of nicotine dependence: Secondary | ICD-10-CM | POA: Diagnosis not present

## 2018-12-01 DIAGNOSIS — I10 Essential (primary) hypertension: Secondary | ICD-10-CM | POA: Diagnosis present

## 2018-12-01 DIAGNOSIS — M4326 Fusion of spine, lumbar region: Secondary | ICD-10-CM | POA: Diagnosis not present

## 2018-12-01 DIAGNOSIS — Z981 Arthrodesis status: Secondary | ICD-10-CM | POA: Diagnosis not present

## 2018-12-01 HISTORY — PX: ANTERIOR LAT LUMBAR FUSION: SHX1168

## 2018-12-01 HISTORY — DX: Myoneural disorder, unspecified: G70.9

## 2018-12-01 HISTORY — DX: Other seasonal allergic rhinitis: J30.2

## 2018-12-01 HISTORY — DX: Presence of spectacles and contact lenses: Z97.3

## 2018-12-01 LAB — CBC
HCT: 47.6 % (ref 39.0–52.0)
Hemoglobin: 15.2 g/dL (ref 13.0–17.0)
MCH: 27.8 pg (ref 26.0–34.0)
MCHC: 31.9 g/dL (ref 30.0–36.0)
MCV: 87 fL (ref 80.0–100.0)
Platelets: 231 10*3/uL (ref 150–400)
RBC: 5.47 MIL/uL (ref 4.22–5.81)
RDW: 13.2 % (ref 11.5–15.5)
WBC: 8 10*3/uL (ref 4.0–10.5)
nRBC: 0 % (ref 0.0–0.2)

## 2018-12-01 LAB — TYPE AND SCREEN
ABO/RH(D): A POS
Antibody Screen: NEGATIVE

## 2018-12-01 LAB — BASIC METABOLIC PANEL
Anion gap: 11 (ref 5–15)
BUN: 9 mg/dL (ref 8–23)
CO2: 23 mmol/L (ref 22–32)
Calcium: 9.4 mg/dL (ref 8.9–10.3)
Chloride: 105 mmol/L (ref 98–111)
Creatinine, Ser: 0.89 mg/dL (ref 0.61–1.24)
GFR calc Af Amer: >60 mL/min (ref >60)
GFR calc non Af Amer: >60 mL/min (ref >60)
Glucose, Bld: 94 mg/dL (ref 70–99)
Potassium: 3.7 mmol/L (ref 3.5–5.1)
Sodium: 139 mmol/L (ref 135–145)

## 2018-12-01 IMAGING — RF DG C-ARM 61-120 MIN
1 series · 2 of 2 positions shown · non-contrast
Comparison: [DATE].

CLINICAL DATA: Back pain.  L3-L4 fusion.

EXAM:
DG C-ARM 61-120 MIN

[Series 1: run · 2 of 2 slices shown]
[im 1/2]
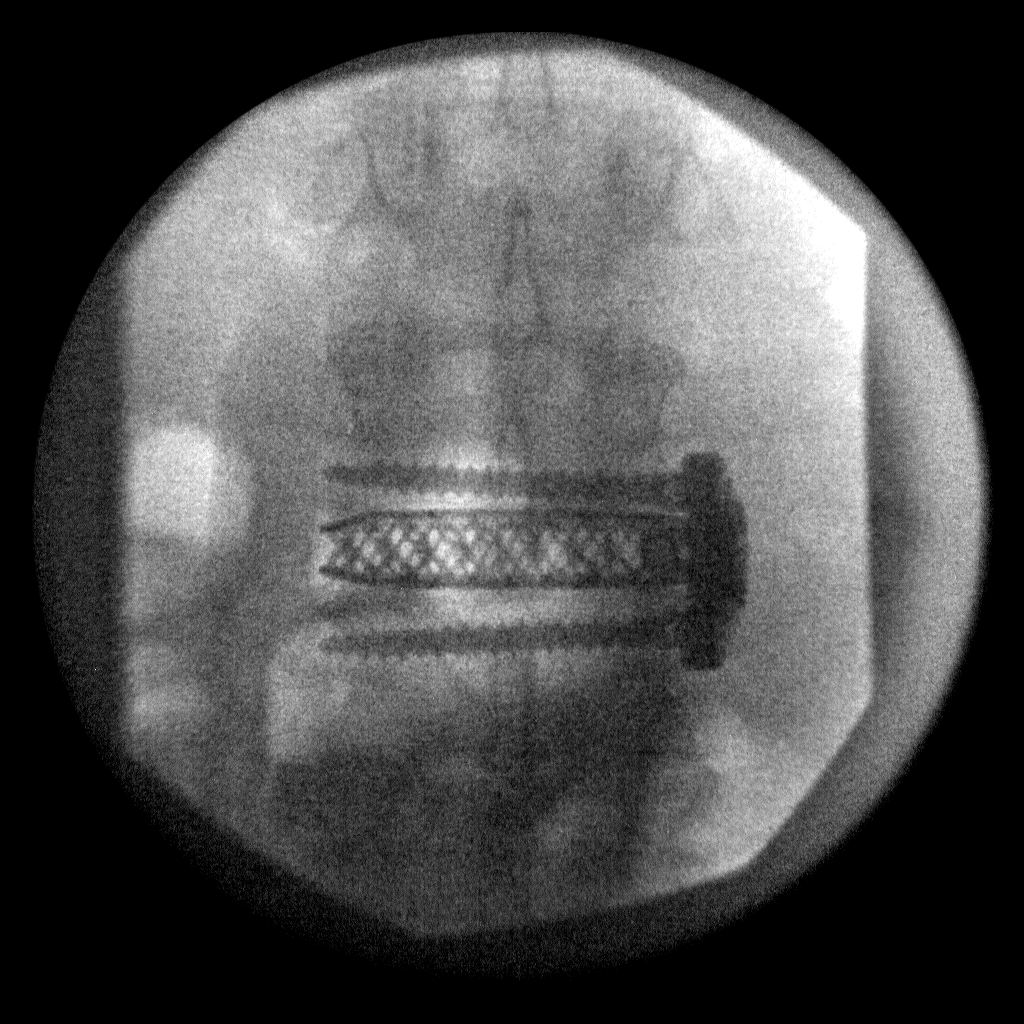
[im 2/2]
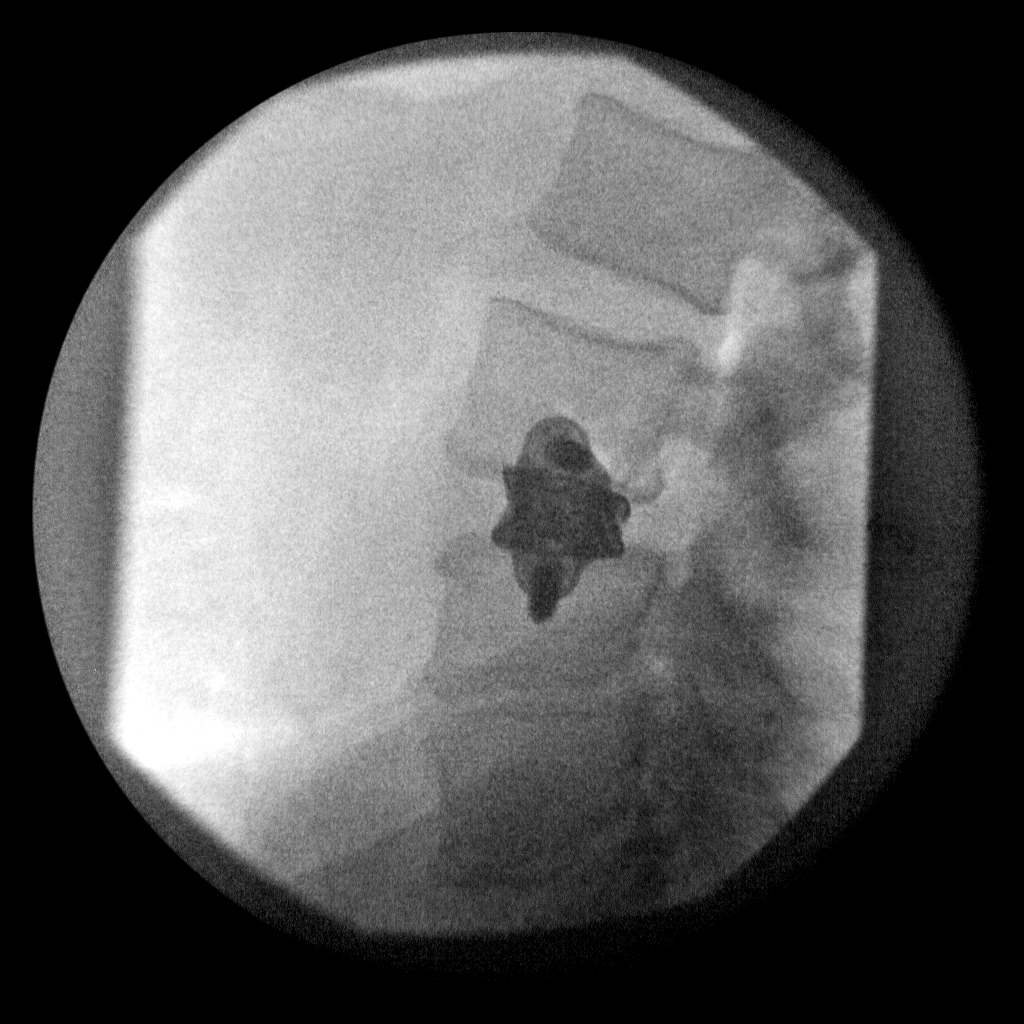

[2 of 2 positions shown; findings below may reference images not displayed]

FINDINGS: The patient has undergone reported L3-L4 fusion. The total
fluoroscopy time was 14 seconds. Two images were obtained. The
hardware is noted within interbody spacer at the reported L3-L4
level. The hardware appears intact.
IMPRESSION: Fluoroscopy provided for reported L3-L4 fusion.

## 2018-12-01 SURGERY — ANTERIOR LATERAL LUMBAR FUSION 1 LEVEL
Anesthesia: General | Site: Spine Lumbar | Laterality: Left

## 2018-12-01 MED ORDER — 0.9 % SODIUM CHLORIDE (POUR BTL) OPTIME
TOPICAL | Status: DC | PRN
  Administered 2018-12-01: 1000 mL

## 2018-12-01 MED ORDER — BUPIVACAINE HCL (PF) 0.5 % IJ SOLN
INTRAMUSCULAR | Status: AC
  Filled 2018-12-01: qty 30

## 2018-12-01 MED ORDER — ONDANSETRON HCL 4 MG/2ML IJ SOLN
INTRAMUSCULAR | Status: DC | PRN
  Administered 2018-12-01: 4 mg via INTRAVENOUS

## 2018-12-01 MED ORDER — ONDANSETRON HCL 4 MG PO TABS: 4.0000 mg | ORAL_TABLET | Freq: Four times a day (QID) | ORAL | Status: DC | PRN

## 2018-12-01 MED ORDER — LACTATED RINGERS IV SOLN: INTRAVENOUS | Status: DC

## 2018-12-01 MED ORDER — HYDROCODONE-ACETAMINOPHEN 5-325 MG PO TABS
ORAL_TABLET | ORAL | Status: AC
  Filled 2018-12-01: qty 2

## 2018-12-01 MED ORDER — PHENOL 1.4 % MT LIQD: 1.0000 | OROMUCOSAL | Status: DC | PRN

## 2018-12-01 MED ORDER — LORATADINE 10 MG PO TABS
10.0000 mg | ORAL_TABLET | Freq: Every day | ORAL | Status: DC
  Administered 2018-12-02 – 2018-12-03 (×2): 10 mg via ORAL
  Filled 2018-12-01 (×2): qty 1

## 2018-12-01 MED ORDER — LISINOPRIL-HYDROCHLOROTHIAZIDE 20-25 MG PO TABS: 1.0000 | ORAL_TABLET | Freq: Every day | ORAL | Status: DC

## 2018-12-01 MED ORDER — CHLORHEXIDINE GLUCONATE CLOTH 2 % EX PADS: 6.0000 | MEDICATED_PAD | Freq: Once | CUTANEOUS | Status: DC

## 2018-12-01 MED ORDER — DEXAMETHASONE SODIUM PHOSPHATE 10 MG/ML IJ SOLN
INTRAMUSCULAR | Status: DC | PRN
  Administered 2018-12-01: 10 mg via INTRAVENOUS

## 2018-12-01 MED ORDER — THROMBIN 20000 UNITS EX SOLR
CUTANEOUS | Status: AC
  Filled 2018-12-01: qty 20000

## 2018-12-01 MED ORDER — ACETAMINOPHEN 650 MG RE SUPP: 650.0000 mg | RECTAL | Status: DC | PRN

## 2018-12-01 MED ORDER — THROMBIN 5000 UNITS EX SOLR
OROMUCOSAL | Status: DC | PRN
  Administered 2018-12-01: 18:00:00 via TOPICAL

## 2018-12-01 MED ORDER — HYDROMORPHONE HCL 1 MG/ML IJ SOLN
0.2500 mg | INTRAMUSCULAR | Status: DC | PRN
  Administered 2018-12-01 (×4): 0.5 mg via INTRAVENOUS

## 2018-12-01 MED ORDER — ACETAMINOPHEN 325 MG PO TABS: 650.0000 mg | ORAL_TABLET | ORAL | Status: DC | PRN

## 2018-12-01 MED ORDER — MORPHINE SULFATE (PF) 2 MG/ML IV SOLN
2.0000 mg | INTRAVENOUS | Status: DC | PRN
  Administered 2018-12-01: 4 mg via INTRAVENOUS
  Filled 2018-12-01: qty 2

## 2018-12-01 MED ORDER — PROPOFOL 10 MG/ML IV BOLUS
INTRAVENOUS | Status: DC | PRN
  Administered 2018-12-01: 200 mg via INTRAVENOUS

## 2018-12-01 MED ORDER — DIAZEPAM 5 MG PO TABS
5.0000 mg | ORAL_TABLET | Freq: Four times a day (QID) | ORAL | Status: DC | PRN
  Administered 2018-12-02: 01:00:00 5 mg via ORAL
  Filled 2018-12-01: qty 1

## 2018-12-01 MED ORDER — LISINOPRIL 20 MG PO TABS
20.0000 mg | ORAL_TABLET | Freq: Every day | ORAL | Status: DC
  Administered 2018-12-01 – 2018-12-03 (×3): 20 mg via ORAL
  Filled 2018-12-01 (×3): qty 1

## 2018-12-01 MED ORDER — SODIUM CHLORIDE 0.9 % IV SOLN
INTRAVENOUS | Status: DC | PRN
  Administered 2018-12-01: 50 ug/min via INTRAVENOUS

## 2018-12-01 MED ORDER — METHOCARBAMOL 500 MG PO TABS
ORAL_TABLET | ORAL | Status: AC
  Filled 2018-12-01: qty 1

## 2018-12-01 MED ORDER — ALUM & MAG HYDROXIDE-SIMETH 200-200-20 MG/5ML PO SUSP: 30.0000 mL | Freq: Four times a day (QID) | ORAL | Status: DC | PRN

## 2018-12-01 MED ORDER — PROMETHAZINE HCL 25 MG/ML IJ SOLN: 6.2500 mg | INTRAMUSCULAR | Status: DC | PRN

## 2018-12-01 MED ORDER — AMLODIPINE BESYLATE 5 MG PO TABS
5.0000 mg | ORAL_TABLET | Freq: Every day | ORAL | Status: DC
  Administered 2018-12-01 – 2018-12-03 (×3): 5 mg via ORAL
  Filled 2018-12-01 (×3): qty 1

## 2018-12-01 MED ORDER — SUCCINYLCHOLINE CHLORIDE 200 MG/10ML IV SOSY
PREFILLED_SYRINGE | INTRAVENOUS | Status: DC | PRN
  Administered 2018-12-01: 100 mg via INTRAVENOUS
  Administered 2018-12-01: 200 mg via INTRAVENOUS

## 2018-12-01 MED ORDER — GABAPENTIN 600 MG PO TABS
600.0000 mg | ORAL_TABLET | Freq: Three times a day (TID) | ORAL | Status: DC
  Administered 2018-12-01 – 2018-12-03 (×5): 600 mg via ORAL
  Filled 2018-12-01 (×5): qty 1

## 2018-12-01 MED ORDER — MENTHOL 3 MG MT LOZG: 1.0000 | LOZENGE | OROMUCOSAL | Status: DC | PRN

## 2018-12-01 MED ORDER — CEFAZOLIN SODIUM-DEXTROSE 2-4 GM/100ML-% IV SOLN
INTRAVENOUS | Status: AC
  Administered 2018-12-02: 2000 mg
  Filled 2018-12-01: qty 100

## 2018-12-01 MED ORDER — LACTATED RINGERS IV SOLN
INTRAVENOUS | Status: DC
  Administered 2018-12-01 (×2): via INTRAVENOUS

## 2018-12-01 MED ORDER — FINASTERIDE 5 MG PO TABS
5.0000 mg | ORAL_TABLET | Freq: Every day | ORAL | Status: DC
  Administered 2018-12-02 – 2018-12-03 (×2): 5 mg via ORAL
  Filled 2018-12-01 (×2): qty 1

## 2018-12-01 MED ORDER — PROPOFOL 500 MG/50ML IV EMUL
INTRAVENOUS | Status: DC | PRN
  Administered 2018-12-01: 50 ug/kg/min via INTRAVENOUS

## 2018-12-01 MED ORDER — LIDOCAINE-EPINEPHRINE 1 %-1:100000 IJ SOLN
INTRAMUSCULAR | Status: AC
  Filled 2018-12-01: qty 1

## 2018-12-01 MED ORDER — FLEET ENEMA 7-19 GM/118ML RE ENEM: 1.0000 | ENEMA | Freq: Once | RECTAL | Status: DC | PRN

## 2018-12-01 MED ORDER — SODIUM CHLORIDE 0.9% FLUSH: 3.0000 mL | INTRAVENOUS | Status: DC | PRN

## 2018-12-01 MED ORDER — DOCUSATE SODIUM 100 MG PO CAPS
100.0000 mg | ORAL_CAPSULE | Freq: Two times a day (BID) | ORAL | Status: DC
  Administered 2018-12-01 – 2018-12-03 (×4): 100 mg via ORAL
  Filled 2018-12-01 (×4): qty 1

## 2018-12-01 MED ORDER — OXYCODONE HCL 5 MG PO TABS: 5.0000 mg | ORAL_TABLET | Freq: Once | ORAL | Status: DC | PRN

## 2018-12-01 MED ORDER — LIDOCAINE 2% (20 MG/ML) 5 ML SYRINGE
INTRAMUSCULAR | Status: DC | PRN
  Administered 2018-12-01: 100 mg via INTRAVENOUS

## 2018-12-01 MED ORDER — SODIUM CHLORIDE 0.9 % IV SOLN: 250.0000 mL | INTRAVENOUS | Status: DC

## 2018-12-01 MED ORDER — BISACODYL 10 MG RE SUPP: 10.0000 mg | Freq: Every day | RECTAL | Status: DC | PRN

## 2018-12-01 MED ORDER — FENTANYL CITRATE (PF) 100 MCG/2ML IJ SOLN
INTRAMUSCULAR | Status: DC | PRN
  Administered 2018-12-01: 50 ug via INTRAVENOUS
  Administered 2018-12-01: 100 ug via INTRAVENOUS
  Administered 2018-12-01: 50 ug via INTRAVENOUS

## 2018-12-01 MED ORDER — OXYCODONE HCL 5 MG/5ML PO SOLN: 5.0000 mg | Freq: Once | ORAL | Status: DC | PRN

## 2018-12-01 MED ORDER — ONDANSETRON HCL 4 MG/2ML IJ SOLN: 4.0000 mg | Freq: Four times a day (QID) | INTRAMUSCULAR | Status: DC | PRN

## 2018-12-01 MED ORDER — HYDROCHLOROTHIAZIDE 25 MG PO TABS
25.0000 mg | ORAL_TABLET | Freq: Every day | ORAL | Status: DC
  Administered 2018-12-01 – 2018-12-03 (×3): 25 mg via ORAL
  Filled 2018-12-01 (×3): qty 1

## 2018-12-01 MED ORDER — SENNA 8.6 MG PO TABS
1.0000 | ORAL_TABLET | Freq: Two times a day (BID) | ORAL | Status: DC
  Administered 2018-12-01 – 2018-12-03 (×4): 8.6 mg via ORAL
  Filled 2018-12-01 (×4): qty 1

## 2018-12-01 MED ORDER — METHOCARBAMOL 500 MG PO TABS
500.0000 mg | ORAL_TABLET | Freq: Four times a day (QID) | ORAL | Status: DC | PRN
  Administered 2018-12-02 – 2018-12-03 (×3): 500 mg via ORAL
  Filled 2018-12-01 (×3): qty 1

## 2018-12-01 MED ORDER — HYDROMORPHONE HCL 1 MG/ML IJ SOLN
INTRAMUSCULAR | Status: AC
  Filled 2018-12-01: qty 1

## 2018-12-01 MED ORDER — POLYETHYLENE GLYCOL 3350 17 G PO PACK: 17.0000 g | PACK | Freq: Every day | ORAL | Status: DC | PRN

## 2018-12-01 MED ORDER — THROMBIN 5000 UNITS EX SOLR
CUTANEOUS | Status: AC
  Filled 2018-12-01: qty 5000

## 2018-12-01 MED ORDER — BUPIVACAINE HCL (PF) 0.5 % IJ SOLN
INTRAMUSCULAR | Status: DC | PRN
  Administered 2018-12-01: 5 mL

## 2018-12-01 MED ORDER — MIDAZOLAM HCL 2 MG/2ML IJ SOLN
INTRAMUSCULAR | Status: DC | PRN
  Administered 2018-12-01: 2 mg via INTRAVENOUS

## 2018-12-01 MED ORDER — HYDROCODONE-ACETAMINOPHEN 5-325 MG PO TABS
1.0000 | ORAL_TABLET | ORAL | Status: DC | PRN
  Administered 2018-12-01: 2 via ORAL
  Administered 2018-12-02: 1 via ORAL
  Administered 2018-12-02 – 2018-12-03 (×7): 2 via ORAL
  Filled 2018-12-01 (×8): qty 2

## 2018-12-01 MED ORDER — CEFAZOLIN SODIUM-DEXTROSE 2-4 GM/100ML-% IV SOLN
2.0000 g | Freq: Three times a day (TID) | INTRAVENOUS | Status: AC
  Administered 2018-12-02 (×2): 2 g via INTRAVENOUS
  Filled 2018-12-01 (×2): qty 100

## 2018-12-01 MED ORDER — METHOCARBAMOL 1000 MG/10ML IJ SOLN
500.0000 mg | Freq: Four times a day (QID) | INTRAVENOUS | Status: DC | PRN
  Filled 2018-12-01: qty 5

## 2018-12-01 MED ORDER — METHOCARBAMOL 500 MG PO TABS
500.0000 mg | ORAL_TABLET | Freq: Two times a day (BID) | ORAL | Status: DC | PRN
  Administered 2018-12-01: 500 mg via ORAL

## 2018-12-01 MED ORDER — SODIUM CHLORIDE 0.9% FLUSH
3.0000 mL | Freq: Two times a day (BID) | INTRAVENOUS | Status: DC
  Administered 2018-12-01 – 2018-12-02 (×3): 3 mL via INTRAVENOUS

## 2018-12-01 MED ORDER — LIDOCAINE-EPINEPHRINE 1 %-1:100000 IJ SOLN
INTRAMUSCULAR | Status: DC | PRN
  Administered 2018-12-01: 5 mL

## 2018-12-01 SURGICAL SUPPLY — 40 items
BLADE CLIPPER SURG (BLADE) IMPLANT
BOLT PLATE XLIF 5.5X55 LRG (Bolt) ×4 IMPLANT
BONE MATRIX OSTEOCEL PRO MED (Bone Implant) ×2 IMPLANT
COVER WAND RF STERILE (DRAPES) ×2 IMPLANT
DERMABOND ADVANCED (GAUZE/BANDAGES/DRESSINGS) ×1
DERMABOND ADVANCED .7 DNX12 (GAUZE/BANDAGES/DRESSINGS) ×1 IMPLANT
DRAPE C-ARM 42X72 X-RAY (DRAPES) ×2 IMPLANT
DRAPE C-ARMOR (DRAPES) ×2 IMPLANT
DRAPE LAPAROTOMY 100X72X124 (DRAPES) ×2 IMPLANT
DRAPE POUCH INSTRU U-SHP 10X18 (DRAPES) ×2 IMPLANT
DURAPREP 26ML APPLICATOR (WOUND CARE) ×2 IMPLANT
ELECT REM PT RETURN 9FT ADLT (ELECTROSURGICAL) ×2
ELECTRODE REM PT RTRN 9FT ADLT (ELECTROSURGICAL) ×1 IMPLANT
GAUZE 4X4 16PLY RFD (DISPOSABLE) IMPLANT
GLOVE BIOGEL PI IND STRL 8.5 (GLOVE) ×1 IMPLANT
GLOVE BIOGEL PI INDICATOR 8.5 (GLOVE) ×1
GLOVE ECLIPSE 8.5 STRL (GLOVE) ×2 IMPLANT
GLOVE EXAM NITRILE XL STR (GLOVE) IMPLANT
GOWN STRL REUS W/ TWL LRG LVL3 (GOWN DISPOSABLE) IMPLANT
GOWN STRL REUS W/ TWL XL LVL3 (GOWN DISPOSABLE) ×1 IMPLANT
GOWN STRL REUS W/TWL 2XL LVL3 (GOWN DISPOSABLE) ×2 IMPLANT
GOWN STRL REUS W/TWL LRG LVL3 (GOWN DISPOSABLE)
GOWN STRL REUS W/TWL XL LVL3 (GOWN DISPOSABLE) ×1
KIT BASIN OR (CUSTOM PROCEDURE TRAY) ×2 IMPLANT
KIT DILATOR XLIF 5 (KITS) ×2 IMPLANT
KIT SURGICAL ACCESS MAXCESS 4 (KITS) ×2 IMPLANT
KIT TURNOVER KIT B (KITS) ×2 IMPLANT
MODULE NVM5 NEXT GEN EMG (NEEDLE) ×2 IMPLANT
MODULUS XLW 12X22X55MM 10 (Spine Construct) ×2 IMPLANT
NEEDLE HYPO 25X1 1.5 SAFETY (NEEDLE) ×2 IMPLANT
NS IRRIG 1000ML POUR BTL (IV SOLUTION) ×2 IMPLANT
PACK LAMINECTOMY NEURO (CUSTOM PROCEDURE TRAY) ×2 IMPLANT
PLATE DECADE XLIP 2H SZ12 (Plate) ×2 IMPLANT
SPONGE LAP 4X18 RFD (DISPOSABLE) IMPLANT
SUT VIC AB 2-0 CP2 18 (SUTURE) ×2 IMPLANT
SUT VIC AB 3-0 SH 8-18 (SUTURE) ×2 IMPLANT
TOWEL GREEN STERILE (TOWEL DISPOSABLE) ×2 IMPLANT
TOWEL GREEN STERILE FF (TOWEL DISPOSABLE) ×2 IMPLANT
TRAY FOLEY MTR SLVR 16FR STAT (SET/KITS/TRAYS/PACK) ×2 IMPLANT
WATER STERILE IRR 1000ML POUR (IV SOLUTION) ×2 IMPLANT

## 2018-12-01 NOTE — Anesthesia Procedure Notes (Signed)
Procedure Name: Intubation Date/Time: 12/01/2018 4:41 PM Performed by: Barrington Ellison, CRNA Pre-anesthesia Checklist: Patient identified, Emergency Drugs available, Suction available and Patient being monitored Patient Re-evaluated:Patient Re-evaluated prior to induction Oxygen Delivery Method: Circle System Utilized Preoxygenation: Pre-oxygenation with 100% oxygen Induction Type: IV induction and Rapid sequence Laryngoscope Size: Glidescope and 4 Grade View: Grade I Tube type: Oral Tube size: 7.5 mm Number of attempts: 1 Airway Equipment and Method: Stylet and Oral airway Placement Confirmation: ETT inserted through vocal cords under direct vision,  positive ETCO2 and breath sounds checked- equal and bilateral Secured at: 22 cm Tube secured with: Tape Dental Injury: Teeth and Oropharynx as per pre-operative assessment  Difficulty Due To: Difficulty was anticipated, Difficult Airway- due to reduced neck mobility, Difficult Airway- due to large tongue and Difficult Airway- due to limited oral opening Future Recommendations: Recommend- induction with short-acting agent, and alternative techniques readily available

## 2018-12-01 NOTE — Anesthesia Preprocedure Evaluation (Addendum)
Anesthesia Evaluation  Patient identified by MRN, date of birth, ID band Patient awake    Reviewed: Allergy & Precautions, NPO status , Patient's Chart, lab work & pertinent test results  Airway Mallampati: II  TM Distance: <3 FB Neck ROM: Full    Dental no notable dental hx.    Pulmonary neg pulmonary ROS, former smoker,    breath sounds clear to auscultation + decreased breath sounds      Cardiovascular hypertension, Pt. on medications Normal cardiovascular exam Rhythm:Regular Rate:Normal     Neuro/Psych negative neurological ROS  negative psych ROS   GI/Hepatic negative GI ROS, Neg liver ROS,   Endo/Other  Morbid obesity  Renal/GU negative Renal ROS  negative genitourinary   Musculoskeletal negative musculoskeletal ROS (+)   Abdominal (+) + obese,   Peds negative pediatric ROS (+)  Hematology negative hematology ROS (+)   Anesthesia Other Findings   Reproductive/Obstetrics negative OB ROS                            Anesthesia Physical Anesthesia Plan  ASA: IV  Anesthesia Plan: General   Post-op Pain Management:    Induction: Intravenous  PONV Risk Score and Plan: 2 and Ondansetron, Dexamethasone and Treatment may vary due to age or medical condition  Airway Management Planned: Oral ETT and Video Laryngoscope Planned  Additional Equipment:   Intra-op Plan:   Post-operative Plan: Extubation in OR  Informed Consent: I have reviewed the patients History and Physical, chart, labs and discussed the procedure including the risks, benefits and alternatives for the proposed anesthesia with the patient or authorized representative who has indicated his/her understanding and acceptance.     Dental advisory given  Plan Discussed with: CRNA and Surgeon  Anesthesia Plan Comments:        Anesthesia Quick Evaluation

## 2018-12-01 NOTE — Transfer of Care (Signed)
Immediate Anesthesia Transfer of Care Note  Patient: Aaron Castro  Procedure(s) Performed: Lumbar Three-Four Anterolateral decompression/Fusion with lateral plate fixation (Left Spine Lumbar)  Patient Location: PACU  Anesthesia Type:General  Level of Consciousness: lethargic and responds to stimulation  Airway & Oxygen Therapy: Patient Spontanous Breathing and Patient connected to face mask oxygen  Post-op Assessment: Report given to RN  Post vital signs: Reviewed and stable  Last Vitals:  Vitals Value Taken Time  BP 166/83 12/01/2018  6:42 PM  Temp    Pulse 44 12/01/2018  6:43 PM  Resp 31 12/01/2018  6:43 PM  SpO2 100 % 12/01/2018  6:43 PM  Vitals shown include unvalidated device data.  Last Pain:  Vitals:   12/01/18 1340  TempSrc:   PainSc: 0-No pain      Patients Stated Pain Goal: 5 (29/19/16 6060)  Complications: No apparent anesthesia complications

## 2018-12-01 NOTE — Op Note (Addendum)
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, cauda equina syndrome, radiculopathy Postoperative diagnosis: Same Procedure: Anterolateral indirect decompression of L3-L4 stenosis using X-lif technique placement of 12 x 22 x 55 mm 10 degree lordotic spacer with 12 mm lateral plate fixation using 5 cc of ostiocell allograft Surgeon: Kristeen Miss Anesthesia: General endotracheal Indications: Aaron Castro is a 68 year old individual whose had severe weakness in his lower extremities to the point where he could not walk he is also had issues of bowel and bladder control which had developed rather significantly he presented to the emergency room where an MRI demonstrated presence of a high-grade stenosis at L3-L4 when seen in the clinic he noted that he is also having difficulties with his upper extremities and an MRI of his neck demonstrated a severe stenosis at the level of C3-C4.  It is felt to be a priority to decompress his cervical spine first which was done a few weeks ago but now he is presenting for lumbar decompression at L3-L4.  Procedure: The patient was brought to the operating room supine on the stretcher.  After the smooth induction of general endotracheal anesthesia he was carefully turned into the right lateral decubitus position and orthogonal positioning was checked with fluoroscopy when this was assured he was secured to the operating table with a series of tape and once secured final radiographs were obtained to place the incision over the left side at L3-L4.  The skin was prepped with alcohol DuraPrep and draped in a sterile fashion.  Then a linear incision was made in the chosen area and this was dissected down through the subcutaneous tissues.  A blunt tipped guide tube for placement of a K wire was then used the past through the fascia into the retroperitoneal space and carefully guided through the psoas muscle at L3-L4 to the disc  space at L3-L4 this was docked on the lateral aspect of the disc space at approximately the halfway point before between anterior and posterior.  After checking EMG stimulation and finding no abnormal activity a K wire was placed into the disc space.  Then a series of dilators was passed over this to dilate to a 15 mm diameter each time checking EMG for activity none was noted.  Then a deep retractor measuring 180 mm was placed over the outer tube and docked onto the disc space with a K wire securing the position.  This was slightly expanded in the inner tubes around the K wire were removed.  This allowed visualization down to the lateral aspect of the disc space and carefully the posterior aspect was stimulated to make sure that no neural elements were nearby when none were found a shim was placed into the disc space at L3-L4 under fluoroscopic visualization.  Again EMG monitoring revealed no abnormal activity.  The space was then entered using a #15 blade and a combination of curettes and rongeurs and Cobb elevators were used to open up the space and open the contralateral ligament the space was evacuated of a substantial quantity of severely degenerated disks material and ultimately the endplates were curettaged smoothed down to raw bony surfaces.  Then a series of dilators were passed and to the disc space and ultimately it was felt that a 12 mm tall 22 x 55 mm spacer with 10 degrees of lordosis would fit best to distract this interspace and distract the posterior elements.  The endplates were doubly checked to be prepared and then the  spacer was placed under fluoroscopic guidance.  Once placed a lateral plate was placed measuring 12 mm with 55mm standard bone screws to secure the plate to the lateral aspect of the vertebral bodies.  Once this was completed final radiographs were obtained in AP and lateral projection.  The fascia was closed with 2-0 Vicryl in interrupted fashion 3-0 Vicryl was used in  subcuticular skin.  Dermabond was placed on the skin.  Total blood loss for the case was 50 cc and patient tolerated procedure well was returned to recovery room in stable condition.
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
## 2018-12-01 NOTE — Progress Notes (Signed)
Patient ID: Aaron Castro, male   DOB: April 28, 1951, 68 y.o.   MRN: [id number] Arousable in recovery room moving lower extremities.  Incisions clean and dry.

## 2018-12-02 ENCOUNTER — Encounter (HOSPITAL_COMMUNITY): Payer: Self-pay | Admitting: Neurological Surgery

## 2018-12-02 NOTE — Anesthesia Postprocedure Evaluation (Signed)
Anesthesia Post Note  Patient: Aaron Castro  Procedure(s) Performed: Lumbar Three-Four Anterolateral decompression/Fusion with lateral plate fixation (Left Spine Lumbar)     Patient location during evaluation: PACU Anesthesia Type: General Level of consciousness: awake and alert Pain management: pain level controlled Vital Signs Assessment: post-procedure vital signs reviewed and stable Respiratory status: spontaneous breathing, nonlabored ventilation, respiratory function stable and patient connected to nasal cannula oxygen Cardiovascular status: blood pressure returned to baseline and stable Postop Assessment: no apparent nausea or vomiting Anesthetic complications: no    Last Vitals:  Vitals:   12/02/18 0339 12/02/18 0740  BP: (!) 173/50 (!) 142/52  Pulse: (!) 49 (!) 45  Resp: 20 18  Temp: 37.3 C 37 C  SpO2: 95% 97%    Last Pain:  Vitals:   12/02/18 0740  TempSrc: Oral  PainSc:                  Cinthya Bors S

## 2018-12-02 NOTE — Progress Notes (Addendum)
Patient ID: Aaron Castro, male   DOB: [birth year], 68 y.o.   MRN: [id number] Vital signs are stable.  Patient is ambulating with use of walker Motor function appears intact Incisions are clean and dry Will require PT and OT at home Will place orders today for plan discharge tomorrow

## 2018-12-02 NOTE — TOC Initial Note (Signed)
Transition of Care [facility]) - Initial/Assessment Note  Marvetta Gibbons RN,BSN Transitions of Care Cross Coverage Unit [facility] - RN Case Manager [phone]  Patient Details  Name: Aaron Castro MRN: [id number] Date of Birth: Oct 21, 1950  Transition of Care [facility]) CM/SW Contact:    Dawayne Patricia, RN Phone Number: 12/02/2018, 12:17 PM  Clinical Narrative:                 Pt s/p Lumbar decompression for lumbar stenosis, orders placed for HHPT/OT, CM spoke with pt at bedside- list provided Per CMS guidelines from medicare.gov website with star ratings (copy placed in shadow chart)- pt states he used [facility] just 3 weeks ago when he had his neck surgery wants to use them again. Pt reports that he has all needed DME. Call made to Sharmon Revere with [facility]- for HHPT/OT referral- they will contact pt for start of care visit with plan to see pt day after discharge.   Expected Discharge Plan: [place] Barriers to Discharge: No Barriers Identified   Patient Goals and CMS Choice Patient states their goals for this hospitalization and ongoing recovery are:: "to be pain free" CMS Medicare.gov Compare Post Acute Care list provided to:: Patient Choice offered to / list presented to : Patient  Expected Discharge Plan and Services Expected Discharge Plan: [place]   Discharge Planning Services: CM Consult Post Acute Care Choice: [place] arrangements for the past 2 months: Single Family Home                 DME Arranged: N/A DME Agency: NA       HH Arranged: PT, OT HH Agency: [place] Date [place]: 12/02/18 Time [place]: 1217 Representative spoke with at [place]: Malachy Mood  Prior Living Arrangements/Services Living arrangements for the past 2 months: [place] Lives with:: Relatives Patient language and need for interpreter reviewed:: Yes Do you feel safe going back to the place  where you live?: Yes      Need for Family Participation in Patient Care: Yes (Comment) Care giver support system in place?: Yes (comment) Current home services: DME Criminal Activity/Legal Involvement Pertinent to Current Situation/Hospitalization: No - Comment as needed  Activities of Daily Living Home Assistive Devices/Equipment: Eyeglasses, Environmental consultant (specify type), Cane (specify quad or straight) ADL Screening (condition at time of admission) Patient's cognitive ability adequate to safely complete daily activities?: Yes Is the patient deaf or have difficulty hearing?: No Does the patient have difficulty seeing, even when wearing glasses/contacts?: No Does the patient have difficulty concentrating, remembering, or making decisions?: Yes(some memory loss-short term) Patient able to express need for assistance with ADLs?: Yes Does the patient have difficulty dressing or bathing?: No Independently performs ADLs?: Yes (appropriate for developmental age) Does the patient have difficulty walking or climbing stairs?: Yes Weakness of Legs: Both Weakness of Arms/Hands: None  Permission Sought/Granted Permission sought to share information with : Chartered certified accountant granted to share information with : Yes, Verbal Permission Granted     Permission granted to share info w AGENCY: [facility]        Emotional Assessment Appearance:: Appears stated age Attitude/Demeanor/Rapport: Engaged Affect (typically observed): Appropriate, Pleasant Orientation: : Oriented to Self, Oriented to Situation, Oriented to Place, Oriented to  Time Alcohol / Substance Use: Not Applicable Psych Involvement: No (comment)  Admission diagnosis:  Lumbar stenosis with neurogenic claudication Patient Active Problem List  Diagnosis Date Noted  . Lumbar stenosis with neurogenic claudication 12/01/2018  . Cervical myelopathy (HCC) 11/04/2018  . Constipation 05/22/2016  . Diverticulosis of large  intestine without hemorrhage 12/28/2014  . Morbid obesity (HCC) 04/15/2014  . HTN (hypertension) 04/15/2014  . History of traumatic brain injury 10/26/2013  . Chronic pain 04/28/2012  . Arthritis 08/07/2011   PCP:  Ronnald NianLalonde, John C, MD Pharmacy:   [facility] [phone] - [place], [place] - [address] AT [facility] [address] [place] [id number] Phone: [phone] Fax: [phone]     Social Determinants of Health (SDOH) Interventions    Readmission Risk Interventions Readmission Risk Prevention Plan 12/02/2018 11/05/2018  Post Dischage Appt Complete Complete  Medication Screening Complete Complete  Transportation Screening Complete Complete  Some recent data might be hidden

## 2018-12-02 NOTE — Progress Notes (Signed)
Orthopedic Tech Progress Note Patient Details:  [place] 1950-08-27 [id number] Called in order to [facility] for [name] Patient ID: Aaron Castro, male   DOB: [birth year], 68 y.o.   MRN: [id number]   [name] 12/02/2018, 8:36 AM

## 2018-12-02 NOTE — Plan of Care (Signed)

## 2018-12-02 NOTE — Evaluation (Signed)
Physical Therapy Evaluation Patient Details Name: Aaron Castro MRN: [id number] DOB: [birth year] Today's Date: 12/02/2018   History of Present Illness  Pt is a 68 y/o male now s/p Anterolateral indirect decompression/lateral plate fixaion of H9-Q2 stenosis. PMHx includes previous ACDF 11/04/2018, arthritis, dyspnea, HTN, hx of R knee arthroscopy   Clinical Impression  Pt admitted with above diagnosis. Pt currently with functional limitations due to the deficits listed below (see PT Problem List). At the time of PT eval pt was able to perform transfers and ambulation with gross min guard assist to min assist for balance support and safety with the RW. Pt was educated on precautions, brace application/wearing schedule, car transfer, and activity progression. Pt will benefit from skilled PT to increase their independence and safety with mobility to allow discharge to the venue listed below.       Follow Up Recommendations Home health PT;Supervision/Assistance - 24 hour    Equipment Recommendations  None recommended by PT    Recommendations for Other Services       Precautions / Restrictions Precautions Precautions: Fall;Back Precaution Booklet Issued: Yes (comment) Precaution Comments: Pt was cued for precautions during functional mobility.  Required Braces or Orthoses: Spinal Brace Spinal Brace: Lumbar corset;Applied in sitting position Restrictions Weight Bearing Restrictions: No      Mobility  Bed Mobility               General bed mobility comments: received sitting OOB in recliner  Transfers Overall transfer level: Needs assistance Equipment used: Rolling walker (2 wheeled) Transfers: Sit to/from Stand Sit to Stand: Min assist         General transfer comment: Increased time required, and rocking momentum utilized to power-up to full stand. Heavy min assist to gain balance enough to complete stand without bending forward.   Ambulation/Gait Ambulation/Gait  assistance: Min guard Gait Distance (Feet): 150 Feet Assistive device: Rolling walker (2 wheeled) Gait Pattern/deviations: Step-through pattern;Decreased stride length;Wide base of support Gait velocity: Decreased Gait velocity interpretation: 1.31 - 2.62 ft/sec, indicative of limited community ambulator General Gait Details: VC's for general safety and closer walker proximity during hallway ambulation. Pt with difficulty maneuvering around tighter spaces with the RW but did not require assistance achieve.   Stairs            Wheelchair Mobility    Modified Rankin (Stroke Patients Only)       Balance Overall balance assessment: Needs assistance Sitting-balance support: Feet supported Sitting balance-Leahy Scale: Fair     Standing balance support: Bilateral upper extremity supported Standing balance-Leahy Scale: Poor Standing balance comment: reliant on UE support at this time                             Pertinent Vitals/Pain Pain Assessment: Faces Faces Pain Scale: Hurts a little bit Pain Location: back, incisional Pain Descriptors / Indicators: Discomfort;Sore Pain Intervention(s): Monitored during session    Home Living Family/patient expects to be discharged to:: Private residence Living Arrangements: Alone Available Help at Discharge: Family;Friend(s)(brother and friend to assist initially) Type of Home: House Home Access: Level entry     Home Layout: One level Home Equipment: Environmental consultant - 2 wheels;Cane - single point;Bedside commode;Adaptive equipment Additional Comments: some PLOF obtained from most recent hospital stay 10/2018    Prior Function Level of Independence: Needs assistance;Independent with assistive device(s)   Gait / Transfers Assistance Needed: walker for mobility  ADL's / Homemaking Assistance Needed: some  assist to don socks, per past admission brother assists with iADL tasks        Hand Dominance   Dominant Hand: Right     Extremity/Trunk Assessment   Upper Extremity Assessment Upper Extremity Assessment: Defer to OT evaluation    Lower Extremity Assessment Lower Extremity Assessment: Generalized weakness    Cervical / Trunk Assessment Cervical / Trunk Assessment: Other exceptions Cervical / Trunk Exceptions: s/p lumbar surgery  Communication   Communication: No difficulties  Cognition Arousal/Alertness: Awake/alert Behavior During Therapy: WFL for tasks assessed/performed Overall Cognitive Status: Within Functional Limits for tasks assessed                                        General Comments      Exercises     Assessment/Plan    PT Assessment Patient needs continued PT services  PT Problem List Decreased strength;Decreased activity tolerance;Decreased balance;Decreased mobility;Decreased knowledge of use of DME;Decreased safety awareness;Decreased knowledge of precautions;Pain       PT Treatment Interventions DME instruction;Gait training;Stair training;Functional mobility training;Therapeutic activities;Therapeutic exercise;Neuromuscular re-education;Patient/family education    PT Goals (Current goals can be found in the Care Plan section)  Acute Rehab PT Goals Patient Stated Goal: none stated today PT Goal Formulation: With patient Time For Goal Achievement: 12/09/18 Potential to Achieve Goals: Good    Frequency Min 5X/week   Barriers to discharge        Co-evaluation               AM-PAC PT "6 Clicks" Mobility  Outcome Measure Help needed turning from your back to your side while in a flat bed without using bedrails?: A Little Help needed moving from lying on your back to sitting on the side of a flat bed without using bedrails?: A Little Help needed moving to and from a bed to a chair (including a wheelchair)?: A Little Help needed standing up from a chair using your arms (e.g., wheelchair or bedside chair)?: A Little Help needed to walk in  hospital room?: A Little Help needed climbing 3-5 steps with a railing? : A Little 6 Click Score: 18    End of Session Equipment Utilized During Treatment: Gait belt Activity Tolerance: Patient tolerated treatment well Patient left: in chair;with call bell/phone within reach Nurse Communication: Mobility status PT Visit Diagnosis: Unsteadiness on feet (R26.81);Pain;Other symptoms and signs involving the nervous system (R29.898) Pain - part of body: (back)    Time: [id number] PT Time Calculation (min) (ACUTE ONLY): 14 min   Charges:   PT Evaluation $PT Eval Moderate Complexity: 1 Mod          Conni SlipperLaura Lenka Zhao, PT, DPT Acute Rehabilitation Services Pager: [phone] Office: [phone]   Marylynn PearsonLaura D Dacey Milberger 12/02/2018, 12:04 PM

## 2018-12-02 NOTE — Evaluation (Signed)
Occupational Therapy Evaluation Patient Details Name: Aaron SermonsJames E Castro MRN: [id number] DOB: [birth year] Today's Date: 12/02/2018    History of Present Illness Pt is a 68 y/o male now s/p Anterolateral indirect decompression/lateral plate fixaion of L3-L4 stenosis. PMHx includes previous ACDF 11/04/2018, arthritis, dyspnea, HTN, hx of R knee arthroscopy    Clinical Impression   This 68 y/o male presents with the above. Pt reports he has been using RW for functional mobility since most recent ACDF in May 2020; reports able to complete ADL overall without assist, reports requires some assist for donning socks. Pt performing room level mobility today using RW with minA; currently requires minA for UB ADL, overall modA for LB and toileting ADL secondary to adhering to back precautions. Initiated education of back precautions including safety and compensatory techniques for completing ADL/functional transfers. Pt lives alone but reports he will return home with some assist from friend/brother at time of discharge. He will benefit from continued acute OT services and [facility] to maximize his safety and independence with ADL and mobility. Will follow.     Follow Up Recommendations  Home health OT;Supervision/Assistance - 24 hour(24hr initially)    Equipment Recommendations  Tub/shower bench(to be further assessed; pt may decline)           Precautions / Restrictions Precautions Precautions: Fall;Back Precaution Booklet Issued: Yes (comment) Precaution Comments: issued and reviewed with pt Required Braces or Orthoses: Spinal Brace Spinal Brace: Lumbar corset Restrictions Weight Bearing Restrictions: No      Mobility Bed Mobility               General bed mobility comments: received OOB in recliner  Transfers Overall transfer level: Needs assistance Equipment used: Rolling walker (2 wheeled) Transfers: Sit to/from Stand Sit to Stand: Min  assist;Min guard         General transfer comment: VCs for safe hand placement and to control descent when returning to sitting; minA initially to stand from recliner, minguard from toilet     Balance Overall balance assessment: Needs assistance Sitting-balance support: Feet supported Sitting balance-Leahy Scale: Fair     Standing balance support: Bilateral upper extremity supported Standing balance-Leahy Scale: Poor Standing balance comment: reliant on UE support at this time                           ADL either performed or assessed with clinical judgement   ADL Overall ADL's : Needs assistance/impaired Eating/Feeding: Modified independent;Sitting   Grooming: Set up;Sitting   Upper Body Bathing: Min guard;Set up;Sitting   Lower Body Bathing: Moderate assistance;Sit to/from stand Lower Body Bathing Details (indicate cue type and reason): minguard standing balance; educated in use of LH sponge for bathing task Upper Body Dressing : Minimal assistance;Sitting;Standing Upper Body Dressing Details (indicate cue type and reason): for brace management Lower Body Dressing: Moderate assistance;Sit to/from stand Lower Body Dressing Details (indicate cue type and reason): minguard standing balance; pt reports he has a reacher at home, declines needing to use for LB dressing though suspect pt may have more difficulty initially with LB tasks given current back precautions, will benefit from further education  Toilet Transfer: Minimal assistance;Ambulation;Regular Toilet;Grab bars;RW StatisticianToilet Transfer Details (indicate cue type and reason): pt not able to safely fit himself and RW into small area of bathroom, pt using grab bars and sink counter top to assist with transitions into/out of bathroom with RW at doorway; close minguard  for safety Toileting- Clothing Manipulation and Hygiene: Sit to/from stand;Moderate assistance Toileting - Clothing Manipulation Details (indicate cue type  and reason): assist for gown management, suspect may need further assist for pericare after BM     Functional mobility during ADLs: Minimal assistance;Rolling walker General ADL Comments: initiated education on back precautions, safety and compensatory technique for completing ADL and functional transfers - pt will benefit from further review      Vision         Perception     Praxis      Pertinent Vitals/Pain Pain Assessment: Faces Faces Pain Scale: Hurts a little bit Pain Location: back, incisional Pain Descriptors / Indicators: Discomfort;Sore Pain Intervention(s): Limited activity within patient's tolerance;Monitored during session;Premedicated before session     Hand Dominance Right   Extremity/Trunk Assessment Upper Extremity Assessment Upper Extremity Assessment: Overall WFL for tasks assessed   Lower Extremity Assessment Lower Extremity Assessment: Defer to PT evaluation   Cervical / Trunk Assessment Cervical / Trunk Assessment: Other exceptions Cervical / Trunk Exceptions: s/p lumbar surgery   Communication Communication Communication: No difficulties   Cognition Arousal/Alertness: Awake/alert Behavior During Therapy: WFL for tasks assessed/performed Overall Cognitive Status: Within Functional Limits for tasks assessed                                     General Comments       Exercises     Shoulder Instructions      Home Living Family/patient expects to be discharged to:: Private residence Living Arrangements: Alone Available Help at Discharge: Family;Friend(s)(brother and friend to assist initially) Type of Home: House Home Access: Level entry     Home Layout: One level     Bathroom Shower/Tub: Teacher, early years/pre: Standard     Home Equipment: Environmental consultant - 2 wheels;Cane - single point;Bedside commode;Adaptive equipment Adaptive Equipment: Reacher Additional Comments: some PLOF obtained from most recent hospital  stay 10/2018      Prior Functioning/Environment Level of Independence: Needs assistance;Independent with assistive device(s)  Gait / Transfers Assistance Needed: walker for mobility ADL's / Homemaking Assistance Needed: some assist to don socks, per past admission brother assists with iADL tasks            OT Problem List: Decreased strength;Decreased range of motion;Impaired balance (sitting and/or standing);Decreased activity tolerance;Decreased knowledge of use of DME or AE;Decreased knowledge of precautions;Obesity      OT Treatment/Interventions: Self-care/ADL training;Therapeutic exercise;Neuromuscular education;DME and/or AE instruction;Therapeutic activities;Patient/family education;Balance training    OT Goals(Current goals can be found in the care plan section) Acute Rehab OT Goals Patient Stated Goal: none stated today OT Goal Formulation: With patient Time For Goal Achievement: 12/16/18 Potential to Achieve Goals: Good  OT Frequency: Min 2X/week   Barriers to D/C:            Co-evaluation              AM-PAC OT "6 Clicks" Daily Activity     Outcome Measure Help from another person eating meals?: None Help from another person taking care of personal grooming?: A Little Help from another person toileting, which includes using toliet, bedpan, or urinal?: A Little Help from another person bathing (including washing, rinsing, drying)?: A Lot Help from another person to put on and taking off regular upper body clothing?: A Little Help from another person to put on and taking off regular lower body clothing?:  A Lot 6 Click Score: 17   End of Session Equipment Utilized During Treatment: Engineer, waterolling walker Nurse Communication: Mobility status  Activity Tolerance: Patient tolerated treatment well Patient left: in chair;with call bell/phone within reach;Other (comment)(MD present)  OT Visit Diagnosis: Other abnormalities of gait and mobility (R26.89);Unsteadiness on  feet (R26.81)                Time: [id number] OT Time Calculation (min): 15 min Charges:  OT General Charges $OT Visit: 1 Visit OT Evaluation $OT Eval Low Complexity: 1 Low  Marcy SirenBreanna Taylour Lietzke, OT Supplemental Rehabilitation Services Pager [phone] Office [phone]   Orlando PennerBreanna L Thales Knipple 12/02/2018, 11:23 AM

## 2018-12-03 MED ORDER — HYDROCODONE-ACETAMINOPHEN 5-325 MG PO TABS: 1.0000 | ORAL_TABLET | ORAL | 0 refills | Status: DC | PRN

## 2018-12-03 NOTE — Discharge Summary (Signed)
Physician Discharge Summary  Patient ID: Aaron SandiferJames E Kham MRN: [id number] DOB/AGE: 08/01/1950 68 y.o.  Admit date: 12/01/2018 Discharge date: 12/03/2018  Admission Diagnoses: Lumbar stenosis with neurogenic claudication, cauda equina syndrome.  3 L4  Discharge Diagnoses: Neurogenic claudication, cauda equina syndrome, lumbar stenosis L3-L4 Active Problems:   Lumbar stenosis with neurogenic claudication   Discharged Condition: good  Hospital Course: Patient was admitted to undergo surgical decompression using an indirect technique with anterolateral decompression and lateral plate fixation.  He tolerated surgery well.  Consults: None  Significant Diagnostic Studies: None  Treatments: surgery: Anterolateral decompression L3-L4 arthrodesis with allograft internal plate fixation  Discharge Exam: Blood pressure (!) 167/71, pulse (!) 47, temperature 98.4 F (36.9 C), temperature source Oral, resp. rate 18, height 5\' 11"  (1.803 m), weight (!) 170.6 kg, SpO2 96 %. Incisions are clean dry Station and gait is intact  Disposition: Discharge disposition: 01-Home or Self Care       Discharge Instructions    Call MD for:  redness, tenderness, or signs of infection (pain, swelling, redness, odor or green/yellow discharge around incision site)   Complete by:  As directed    Call MD for:  severe uncontrolled pain   Complete by:  As directed    Call MD for:  temperature >100.4   Complete by:  As directed    Diet - low sodium heart healthy   Complete by:  As directed    Discharge instructions   Complete by:  As directed    Okay to shower. Do not apply salves or appointments to incision. No heavy lifting with the upper extremities greater than 15 pounds. May resume driving when not requiring pain medication and patient feels comfortable with doing so.   Incentive spirometry RT   Complete by:  As directed    Increase activity slowly   Complete by:  As directed      Allergies as of  12/03/2018   No Known Allergies     Medication List    TAKE these medications   amLODipine 5 MG tablet Commonly known as:  NORVASC TAKE 1 TABLET(5 MG) BY MOUTH DAILY What changed:  See the new instructions.   cetirizine 10 MG tablet Commonly known as:  ZYRTEC Take 10 mg by mouth daily as needed (itching).   diazepam 5 MG tablet Commonly known as:  Valium Take 1 tablet (5 mg total) by mouth every 6 (six) hours as needed for anxiety or muscle spasms.   diclofenac 75 MG EC tablet Commonly known as:  VOLTAREN TAKE 1 TABLET(75 MG) BY MOUTH TWICE DAILY What changed:  See the new instructions.   finasteride 5 MG tablet Commonly known as:  PROSCAR TAKE 1 TABLET(5 MG) BY MOUTH DAILY What changed:  See the new instructions.   gabapentin 600 MG tablet Commonly known as:  Neurontin Take 1 tablet (600 mg total) by mouth 3 (three) times daily.   HYDROcodone-acetaminophen 5-325 MG tablet Commonly known as:  NORCO/VICODIN Take 1-2 tablets by mouth every 4 (four) hours as needed for moderate pain or severe pain ((score 7 to 10)).   lisinopril-hydrochlorothiazide 20-25 MG tablet Commonly known as:  ZESTORETIC TAKE 1 TABLET BY MOUTH DAILY   methocarbamol 500 MG tablet Commonly known as:  ROBAXIN Take 1 tablet (500 mg total) by mouth 2 (two) times daily as needed for muscle spasms.   polyethylene glycol 17 g packet Commonly known as:  MIRALAX / GLYCOLAX Take 17 g by mouth daily as needed (constipation).  sildenafil 20 MG tablet Commonly known as:  REVATIO TAKE UP TO 5 TABLETS BY MOUTH ONCE TO HELP WITH ERECTIONS What changed:    how much to take  how to take this  when to take this  additional instructions   traMADol 50 MG tablet Commonly known as:  ULTRAM Take 2 tablets (100 mg total) by mouth 3 (three) times daily as needed (pain).      Follow-up Information    [facility] [place] Follow up.   Why:  HHPT/OT arranged- they will contact you for start of care  visit Contact information: [place] [address] [phone]           Signed: Earleen Newport 12/03/2018, 7:43 AM

## 2018-12-03 NOTE — Progress Notes (Signed)
Physical Therapy Treatment Patient Details Name: Aaron SermonsJames E Castro MRN: [id number] DOB: [birth year] Today's Date: 12/03/2018    History of Present Illness Pt is a 68 y/o male now s/p Anterolateral indirect decompression/lateral plate fixaion of L3-L4 stenosis. PMHx includes previous ACDF 11/04/2018, arthritis, dyspnea, HTN, hx of R knee arthroscopy     PT Comments    Pt progressing towards physical therapy goals. Pt was educated on brace application/adjustment/wearing schedule, car transfer, activity progression, and improved maintenance of precautions. Pt continues to require increased VC's for improved posture and to avoid bending/twisting during functional mobility. Pt anticipates d/c home today. Will continue to follow and progress as able per POC.    Follow Up Recommendations  Home health PT;Supervision/Assistance - 24 hour     Equipment Recommendations  None recommended by PT    Recommendations for Other Services       Precautions / Restrictions Precautions Precautions: Fall;Back Precaution Booklet Issued: Yes (comment) Precaution Comments: Pt was cued for precautions during functional mobility.  Required Braces or Orthoses: Spinal Brace Spinal Brace: Lumbar corset;Applied in sitting position Restrictions Weight Bearing Restrictions: No    Mobility  Bed Mobility               General bed mobility comments: received sitting OOB in recliner  Transfers Overall transfer level: Needs assistance Equipment used: Rolling walker (2 wheeled) Transfers: Sit to/from Stand Sit to Stand: Min assist         General transfer comment: Increased time required, and rocking momentum utilized to power-up to full stand. Heavy min assist to gain balance enough to complete stand without bending forward.   Ambulation/Gait Ambulation/Gait assistance: Min guard Gait Distance (Feet): 250 Feet Assistive device: Rolling walker (2 wheeled) Gait Pattern/deviations: Step-through  pattern;Decreased stride length;Wide base of support Gait velocity: Decreased Gait velocity interpretation: 1.31 - 2.62 ft/sec, indicative of limited community ambulator General Gait Details: VC's for general safety and closer walker proximity during hallway ambulation. Pt with difficulty maneuvering around tighter spaces with the RW but did not require assistance to achieve.    Stairs             Wheelchair Mobility    Modified Rankin (Stroke Patients Only)       Balance Overall balance assessment: Needs assistance Sitting-balance support: Feet supported Sitting balance-Leahy Scale: Fair     Standing balance support: Bilateral upper extremity supported Standing balance-Leahy Scale: Poor Standing balance comment: reliant on UE support at this time                            Cognition Arousal/Alertness: Awake/alert Behavior During Therapy: WFL for tasks assessed/performed Overall Cognitive Status: Within Functional Limits for tasks assessed                                        Exercises      General Comments        Pertinent Vitals/Pain Pain Assessment: Faces Faces Pain Scale: Hurts a little bit Pain Location: back, incisional Pain Descriptors / Indicators: Discomfort;Sore Pain Intervention(s): Monitored during session    Home Living                      Prior Function            PT Goals (current goals can now be found in the care  plan section) Acute Rehab PT Goals Patient Stated Goal: none stated today PT Goal Formulation: With patient Time For Goal Achievement: 12/09/18 Potential to Achieve Goals: Good Progress towards PT goals: Progressing toward goals    Frequency    Min 5X/week      PT Plan Current plan remains appropriate    Co-evaluation              AM-PAC PT "6 Clicks" Mobility   Outcome Measure  Help needed turning from your back to your side while in a flat bed without using  bedrails?: A Little Help needed moving from lying on your back to sitting on the side of a flat bed without using bedrails?: A Little Help needed moving to and from a bed to a chair (including a wheelchair)?: A Little Help needed standing up from a chair using your arms (e.g., wheelchair or bedside chair)?: A Little Help needed to walk in hospital room?: A Little Help needed climbing 3-5 steps with a railing? : A Little 6 Click Score: 18    End of Session Equipment Utilized During Treatment: Gait belt Activity Tolerance: Patient tolerated treatment well Patient left: in chair;with call bell/phone within reach Nurse Communication: Mobility status PT Visit Diagnosis: Unsteadiness on feet (R26.81);Pain;Other symptoms and signs involving the nervous system (R29.898) Pain - part of body: (back)     Time: 9381-0175 PT Time Calculation (min) (ACUTE ONLY): 14 min  Charges:  $Gait Training: 8-22 mins                     Rolinda Roan, PT, DPT Acute Rehabilitation Services Pager: [phone] Office: [place] 12/03/2018, 10:18 AM

## 2018-12-03 NOTE — Progress Notes (Signed)
Pt doing well. Pt given D/C instructions with verbal understanding. Pt's incision is clean and dry with no sign of infection. Pt's IV was removed prior to D/C. Pt's HH was arranged by Cm per MD order. Pt D/C'd home via wheelchair per MD order. Pt is stable @ D/C and has no other needs at this time. Holli Humbles, RN
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
## 2018-12-03 NOTE — TOC Transition Note (Signed)
Transition of Care [facility]) - CM/SW Discharge Note Marvetta Gibbons RN,BSN Transitions of Care Cross Coverage Unit [facility] - RN Case Manager [phone]   Patient Details  Name: Aaron Castro MRN: [id number] Date of Birth: 04-Apr-1951  Transition of Care [facility] CM/SW Contact:  Dawayne Patricia, RN Phone Number: 12/03/2018, 11:17 AM   Clinical Narrative:    Pt stable for transition home today, HH has been set up with [facility] with planned start of care for 6/11  Final next level of care: [place] Barriers to Discharge: No Barriers Identified   Patient Goals and CMS Choice Patient states their goals for this hospitalization and ongoing recovery are:: "to be pain free" CMS Medicare.gov Compare Post Acute Care list provided to:: Patient Choice offered to / list presented to : Patient  Discharge Placement  Home [facility]                     Discharge Plan and Services   Discharge Planning Services: CM Consult Post Acute Care Choice: Home Health          DME Arranged: N/A DME Agency: NA       HH Arranged: PT, OT HH Agency: [place] Date [place]: 12/02/18 Time [place]: 1217 Representative spoke with at [place]: [place] Determinants of Health ([place]) Interventions     Readmission Risk Interventions Readmission Risk Prevention Plan 12/02/2018 11/05/2018  Post Dischage Appt Complete Complete  Medication Screening Complete Complete  Transportation Screening Complete Complete  Some recent data might be hidden

## 2018-12-06 DIAGNOSIS — I10 Essential (primary) hypertension: Secondary | ICD-10-CM | POA: Diagnosis not present

## 2018-12-06 DIAGNOSIS — M1990 Unspecified osteoarthritis, unspecified site: Secondary | ICD-10-CM | POA: Diagnosis not present

## 2018-12-06 DIAGNOSIS — Z4789 Encounter for other orthopedic aftercare: Secondary | ICD-10-CM | POA: Diagnosis not present

## 2018-12-06 DIAGNOSIS — Z87891 Personal history of nicotine dependence: Secondary | ICD-10-CM | POA: Diagnosis not present

## 2018-12-06 DIAGNOSIS — M48061 Spinal stenosis, lumbar region without neurogenic claudication: Secondary | ICD-10-CM | POA: Diagnosis not present

## 2018-12-06 DIAGNOSIS — K5790 Diverticulosis of intestine, part unspecified, without perforation or abscess without bleeding: Secondary | ICD-10-CM | POA: Diagnosis not present

## 2018-12-06 DIAGNOSIS — M4712 Other spondylosis with myelopathy, cervical region: Secondary | ICD-10-CM | POA: Diagnosis not present

## 2018-12-10 DIAGNOSIS — M48061 Spinal stenosis, lumbar region without neurogenic claudication: Secondary | ICD-10-CM | POA: Diagnosis not present

## 2018-12-10 DIAGNOSIS — Z4789 Encounter for other orthopedic aftercare: Secondary | ICD-10-CM | POA: Diagnosis not present

## 2018-12-10 DIAGNOSIS — K5790 Diverticulosis of intestine, part unspecified, without perforation or abscess without bleeding: Secondary | ICD-10-CM | POA: Diagnosis not present

## 2018-12-10 DIAGNOSIS — I10 Essential (primary) hypertension: Secondary | ICD-10-CM | POA: Diagnosis not present

## 2018-12-10 DIAGNOSIS — Z87891 Personal history of nicotine dependence: Secondary | ICD-10-CM | POA: Diagnosis not present

## 2018-12-10 DIAGNOSIS — M4712 Other spondylosis with myelopathy, cervical region: Secondary | ICD-10-CM | POA: Diagnosis not present

## 2018-12-10 DIAGNOSIS — M1990 Unspecified osteoarthritis, unspecified site: Secondary | ICD-10-CM | POA: Diagnosis not present

## 2018-12-15 ENCOUNTER — Other Ambulatory Visit: Payer: Self-pay | Admitting: Family Medicine

## 2018-12-15 NOTE — Telephone Encounter (Signed)
[facility] is requesting to fill pt tramadol. Please advise [facility] 

## 2018-12-16 ENCOUNTER — Telehealth: Payer: Self-pay | Admitting: Family Medicine

## 2018-12-16 NOTE — Telephone Encounter (Signed)
Pt called to inquire concerning tramadol refill. Per [employer] pt needs a virtual appt to discuss. Pt is also on pain medication due to recent surgery. Pt to call back to schedule.

## 2018-12-17 DIAGNOSIS — I10 Essential (primary) hypertension: Secondary | ICD-10-CM | POA: Diagnosis not present

## 2018-12-17 DIAGNOSIS — M4712 Other spondylosis with myelopathy, cervical region: Secondary | ICD-10-CM | POA: Diagnosis not present

## 2018-12-17 DIAGNOSIS — Z87891 Personal history of nicotine dependence: Secondary | ICD-10-CM | POA: Diagnosis not present

## 2018-12-17 DIAGNOSIS — M1990 Unspecified osteoarthritis, unspecified site: Secondary | ICD-10-CM | POA: Diagnosis not present

## 2018-12-17 DIAGNOSIS — M48061 Spinal stenosis, lumbar region without neurogenic claudication: Secondary | ICD-10-CM | POA: Diagnosis not present

## 2018-12-17 DIAGNOSIS — K5790 Diverticulosis of intestine, part unspecified, without perforation or abscess without bleeding: Secondary | ICD-10-CM | POA: Diagnosis not present

## 2018-12-17 DIAGNOSIS — Z4789 Encounter for other orthopedic aftercare: Secondary | ICD-10-CM | POA: Diagnosis not present

## 2018-12-18 ENCOUNTER — Ambulatory Visit (INDEPENDENT_AMBULATORY_CARE_PROVIDER_SITE_OTHER): Payer: Medicare Other | Admitting: Family Medicine

## 2018-12-18 ENCOUNTER — Encounter: Payer: Self-pay | Admitting: Family Medicine

## 2018-12-18 ENCOUNTER — Other Ambulatory Visit: Payer: Self-pay

## 2018-12-18 VITALS — BP 140/80 | HR 81 | Temp 97.1°F | Wt 376.0 lb

## 2018-12-18 DIAGNOSIS — Z9889 Other specified postprocedural states: Secondary | ICD-10-CM | POA: Diagnosis not present

## 2018-12-18 DIAGNOSIS — M48062 Spinal stenosis, lumbar region with neurogenic claudication: Secondary | ICD-10-CM | POA: Diagnosis not present
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
## 2018-12-23 ENCOUNTER — Other Ambulatory Visit: Payer: Self-pay | Admitting: Family Medicine

## 2018-12-23 DIAGNOSIS — G8929 Other chronic pain: Secondary | ICD-10-CM

## 2018-12-23 NOTE — Telephone Encounter (Signed)
[facility] is requesting to fill pt diclofenac. Please advise KH 

## 2018-12-24 DIAGNOSIS — Z87891 Personal history of nicotine dependence: Secondary | ICD-10-CM | POA: Diagnosis not present

## 2018-12-24 DIAGNOSIS — M1990 Unspecified osteoarthritis, unspecified site: Secondary | ICD-10-CM | POA: Diagnosis not present

## 2018-12-24 DIAGNOSIS — M4712 Other spondylosis with myelopathy, cervical region: Secondary | ICD-10-CM | POA: Diagnosis not present

## 2018-12-24 DIAGNOSIS — K5790 Diverticulosis of intestine, part unspecified, without perforation or abscess without bleeding: Secondary | ICD-10-CM | POA: Diagnosis not present

## 2018-12-24 DIAGNOSIS — Z4789 Encounter for other orthopedic aftercare: Secondary | ICD-10-CM | POA: Diagnosis not present

## 2018-12-24 DIAGNOSIS — I10 Essential (primary) hypertension: Secondary | ICD-10-CM | POA: Diagnosis not present

## 2018-12-24 DIAGNOSIS — M48061 Spinal stenosis, lumbar region without neurogenic claudication: Secondary | ICD-10-CM | POA: Diagnosis not present

## 2019-01-02 ENCOUNTER — Telehealth: Payer: Self-pay

## 2019-01-02 NOTE — Telephone Encounter (Signed)
Nurse called to advise PT will start 01-05-19 per pt request. [facility]

## 2019-01-04 DIAGNOSIS — M1990 Unspecified osteoarthritis, unspecified site: Secondary | ICD-10-CM | POA: Diagnosis not present

## 2019-01-04 DIAGNOSIS — M4712 Other spondylosis with myelopathy, cervical region: Secondary | ICD-10-CM | POA: Diagnosis not present

## 2019-01-04 DIAGNOSIS — Z4789 Encounter for other orthopedic aftercare: Secondary | ICD-10-CM | POA: Diagnosis not present

## 2019-01-04 DIAGNOSIS — I10 Essential (primary) hypertension: Secondary | ICD-10-CM | POA: Diagnosis not present

## 2019-01-04 DIAGNOSIS — Z87891 Personal history of nicotine dependence: Secondary | ICD-10-CM | POA: Diagnosis not present

## 2019-01-04 DIAGNOSIS — M48061 Spinal stenosis, lumbar region without neurogenic claudication: Secondary | ICD-10-CM | POA: Diagnosis not present

## 2019-01-04 DIAGNOSIS — K5790 Diverticulosis of intestine, part unspecified, without perforation or abscess without bleeding: Secondary | ICD-10-CM | POA: Diagnosis not present

## 2019-01-05 DIAGNOSIS — I10 Essential (primary) hypertension: Secondary | ICD-10-CM | POA: Diagnosis not present

## 2019-01-05 DIAGNOSIS — Z87891 Personal history of nicotine dependence: Secondary | ICD-10-CM | POA: Diagnosis not present

## 2019-01-05 DIAGNOSIS — Z4789 Encounter for other orthopedic aftercare: Secondary | ICD-10-CM | POA: Diagnosis not present

## 2019-01-05 DIAGNOSIS — K5790 Diverticulosis of intestine, part unspecified, without perforation or abscess without bleeding: Secondary | ICD-10-CM | POA: Diagnosis not present

## 2019-01-05 DIAGNOSIS — M4712 Other spondylosis with myelopathy, cervical region: Secondary | ICD-10-CM | POA: Diagnosis not present

## 2019-01-05 DIAGNOSIS — M48061 Spinal stenosis, lumbar region without neurogenic claudication: Secondary | ICD-10-CM | POA: Diagnosis not present

## 2019-01-05 DIAGNOSIS — M1990 Unspecified osteoarthritis, unspecified site: Secondary | ICD-10-CM | POA: Diagnosis not present

## 2019-01-06 ENCOUNTER — Other Ambulatory Visit: Payer: Self-pay | Admitting: Family Medicine

## 2019-01-06 DIAGNOSIS — N401 Enlarged prostate with lower urinary tract symptoms: Secondary | ICD-10-CM

## 2019-01-08 DIAGNOSIS — M48061 Spinal stenosis, lumbar region without neurogenic claudication: Secondary | ICD-10-CM | POA: Diagnosis not present

## 2019-01-08 DIAGNOSIS — Z4789 Encounter for other orthopedic aftercare: Secondary | ICD-10-CM | POA: Diagnosis not present

## 2019-01-08 DIAGNOSIS — M1990 Unspecified osteoarthritis, unspecified site: Secondary | ICD-10-CM | POA: Diagnosis not present

## 2019-01-08 DIAGNOSIS — K5790 Diverticulosis of intestine, part unspecified, without perforation or abscess without bleeding: Secondary | ICD-10-CM | POA: Diagnosis not present

## 2019-01-08 DIAGNOSIS — M4712 Other spondylosis with myelopathy, cervical region: Secondary | ICD-10-CM | POA: Diagnosis not present

## 2019-01-08 DIAGNOSIS — Z87891 Personal history of nicotine dependence: Secondary | ICD-10-CM | POA: Diagnosis not present

## 2019-01-08 DIAGNOSIS — I10 Essential (primary) hypertension: Secondary | ICD-10-CM | POA: Diagnosis not present

## 2019-01-13 DIAGNOSIS — M4712 Other spondylosis with myelopathy, cervical region: Secondary | ICD-10-CM | POA: Diagnosis not present

## 2019-01-13 DIAGNOSIS — Z87891 Personal history of nicotine dependence: Secondary | ICD-10-CM | POA: Diagnosis not present

## 2019-01-13 DIAGNOSIS — M48061 Spinal stenosis, lumbar region without neurogenic claudication: Secondary | ICD-10-CM | POA: Diagnosis not present

## 2019-01-13 DIAGNOSIS — K5790 Diverticulosis of intestine, part unspecified, without perforation or abscess without bleeding: Secondary | ICD-10-CM | POA: Diagnosis not present

## 2019-01-13 DIAGNOSIS — Z4789 Encounter for other orthopedic aftercare: Secondary | ICD-10-CM | POA: Diagnosis not present

## 2019-01-13 DIAGNOSIS — I10 Essential (primary) hypertension: Secondary | ICD-10-CM | POA: Diagnosis not present

## 2019-01-13 DIAGNOSIS — M1990 Unspecified osteoarthritis, unspecified site: Secondary | ICD-10-CM | POA: Diagnosis not present

## 2019-01-16 DIAGNOSIS — Z87891 Personal history of nicotine dependence: Secondary | ICD-10-CM | POA: Diagnosis not present

## 2019-01-16 DIAGNOSIS — K5790 Diverticulosis of intestine, part unspecified, without perforation or abscess without bleeding: Secondary | ICD-10-CM | POA: Diagnosis not present

## 2019-01-16 DIAGNOSIS — M48061 Spinal stenosis, lumbar region without neurogenic claudication: Secondary | ICD-10-CM | POA: Diagnosis not present

## 2019-01-16 DIAGNOSIS — M1990 Unspecified osteoarthritis, unspecified site: Secondary | ICD-10-CM | POA: Diagnosis not present

## 2019-01-16 DIAGNOSIS — M4712 Other spondylosis with myelopathy, cervical region: Secondary | ICD-10-CM | POA: Diagnosis not present

## 2019-01-16 DIAGNOSIS — Z4789 Encounter for other orthopedic aftercare: Secondary | ICD-10-CM | POA: Diagnosis not present

## 2019-01-16 DIAGNOSIS — I10 Essential (primary) hypertension: Secondary | ICD-10-CM | POA: Diagnosis not present

## 2019-01-20 DIAGNOSIS — I10 Essential (primary) hypertension: Secondary | ICD-10-CM | POA: Diagnosis not present

## 2019-01-20 DIAGNOSIS — Z87891 Personal history of nicotine dependence: Secondary | ICD-10-CM | POA: Diagnosis not present

## 2019-01-20 DIAGNOSIS — Z4789 Encounter for other orthopedic aftercare: Secondary | ICD-10-CM | POA: Diagnosis not present

## 2019-01-20 DIAGNOSIS — M1990 Unspecified osteoarthritis, unspecified site: Secondary | ICD-10-CM | POA: Diagnosis not present

## 2019-01-20 DIAGNOSIS — M48061 Spinal stenosis, lumbar region without neurogenic claudication: Secondary | ICD-10-CM | POA: Diagnosis not present

## 2019-01-20 DIAGNOSIS — M4712 Other spondylosis with myelopathy, cervical region: Secondary | ICD-10-CM | POA: Diagnosis not present

## 2019-01-20 DIAGNOSIS — K5790 Diverticulosis of intestine, part unspecified, without perforation or abscess without bleeding: Secondary | ICD-10-CM | POA: Diagnosis not present

## 2019-01-21 DIAGNOSIS — I10 Essential (primary) hypertension: Secondary | ICD-10-CM | POA: Diagnosis not present

## 2019-01-21 DIAGNOSIS — Z87891 Personal history of nicotine dependence: Secondary | ICD-10-CM | POA: Diagnosis not present

## 2019-01-21 DIAGNOSIS — M48061 Spinal stenosis, lumbar region without neurogenic claudication: Secondary | ICD-10-CM | POA: Diagnosis not present

## 2019-01-21 DIAGNOSIS — M1990 Unspecified osteoarthritis, unspecified site: Secondary | ICD-10-CM | POA: Diagnosis not present

## 2019-01-21 DIAGNOSIS — Z4789 Encounter for other orthopedic aftercare: Secondary | ICD-10-CM | POA: Diagnosis not present

## 2019-01-21 DIAGNOSIS — M4712 Other spondylosis with myelopathy, cervical region: Secondary | ICD-10-CM | POA: Diagnosis not present

## 2019-01-21 DIAGNOSIS — K5790 Diverticulosis of intestine, part unspecified, without perforation or abscess without bleeding: Secondary | ICD-10-CM | POA: Diagnosis not present

## 2019-01-23 DIAGNOSIS — K5790 Diverticulosis of intestine, part unspecified, without perforation or abscess without bleeding: Secondary | ICD-10-CM | POA: Diagnosis not present

## 2019-01-23 DIAGNOSIS — M4712 Other spondylosis with myelopathy, cervical region: Secondary | ICD-10-CM | POA: Diagnosis not present

## 2019-01-23 DIAGNOSIS — Z4789 Encounter for other orthopedic aftercare: Secondary | ICD-10-CM | POA: Diagnosis not present

## 2019-01-23 DIAGNOSIS — Z87891 Personal history of nicotine dependence: Secondary | ICD-10-CM | POA: Diagnosis not present

## 2019-01-23 DIAGNOSIS — M48061 Spinal stenosis, lumbar region without neurogenic claudication: Secondary | ICD-10-CM | POA: Diagnosis not present

## 2019-01-23 DIAGNOSIS — M1990 Unspecified osteoarthritis, unspecified site: Secondary | ICD-10-CM | POA: Diagnosis not present

## 2019-01-23 DIAGNOSIS — I10 Essential (primary) hypertension: Secondary | ICD-10-CM | POA: Diagnosis not present

## 2019-01-26 DIAGNOSIS — Z4789 Encounter for other orthopedic aftercare: Secondary | ICD-10-CM | POA: Diagnosis not present

## 2019-01-26 DIAGNOSIS — M48061 Spinal stenosis, lumbar region without neurogenic claudication: Secondary | ICD-10-CM | POA: Diagnosis not present

## 2019-01-26 DIAGNOSIS — K5790 Diverticulosis of intestine, part unspecified, without perforation or abscess without bleeding: Secondary | ICD-10-CM | POA: Diagnosis not present

## 2019-01-26 DIAGNOSIS — M1990 Unspecified osteoarthritis, unspecified site: Secondary | ICD-10-CM | POA: Diagnosis not present

## 2019-01-26 DIAGNOSIS — I10 Essential (primary) hypertension: Secondary | ICD-10-CM | POA: Diagnosis not present

## 2019-01-26 DIAGNOSIS — Z87891 Personal history of nicotine dependence: Secondary | ICD-10-CM | POA: Diagnosis not present

## 2019-01-26 DIAGNOSIS — M4712 Other spondylosis with myelopathy, cervical region: Secondary | ICD-10-CM | POA: Diagnosis not present

## 2019-01-28 DIAGNOSIS — Z4789 Encounter for other orthopedic aftercare: Secondary | ICD-10-CM | POA: Diagnosis not present

## 2019-01-28 DIAGNOSIS — I10 Essential (primary) hypertension: Secondary | ICD-10-CM | POA: Diagnosis not present

## 2019-01-28 DIAGNOSIS — M4712 Other spondylosis with myelopathy, cervical region: Secondary | ICD-10-CM | POA: Diagnosis not present

## 2019-01-28 DIAGNOSIS — M1990 Unspecified osteoarthritis, unspecified site: Secondary | ICD-10-CM | POA: Diagnosis not present

## 2019-01-28 DIAGNOSIS — Z87891 Personal history of nicotine dependence: Secondary | ICD-10-CM | POA: Diagnosis not present

## 2019-01-28 DIAGNOSIS — M48061 Spinal stenosis, lumbar region without neurogenic claudication: Secondary | ICD-10-CM | POA: Diagnosis not present

## 2019-01-28 DIAGNOSIS — K5790 Diverticulosis of intestine, part unspecified, without perforation or abscess without bleeding: Secondary | ICD-10-CM | POA: Diagnosis not present

## 2019-03-04 DIAGNOSIS — K5790 Diverticulosis of intestine, part unspecified, without perforation or abscess without bleeding: Secondary | ICD-10-CM | POA: Diagnosis not present

## 2019-03-04 DIAGNOSIS — M48061 Spinal stenosis, lumbar region without neurogenic claudication: Secondary | ICD-10-CM | POA: Diagnosis not present

## 2019-03-04 DIAGNOSIS — M1990 Unspecified osteoarthritis, unspecified site: Secondary | ICD-10-CM | POA: Diagnosis not present

## 2019-03-04 DIAGNOSIS — Z4789 Encounter for other orthopedic aftercare: Secondary | ICD-10-CM | POA: Diagnosis not present

## 2019-03-04 DIAGNOSIS — I10 Essential (primary) hypertension: Secondary | ICD-10-CM | POA: Diagnosis not present

## 2019-03-04 DIAGNOSIS — Z87891 Personal history of nicotine dependence: Secondary | ICD-10-CM | POA: Diagnosis not present

## 2019-03-04 DIAGNOSIS — M4712 Other spondylosis with myelopathy, cervical region: Secondary | ICD-10-CM | POA: Diagnosis not present

## 2019-03-17 ENCOUNTER — Encounter: Payer: Self-pay | Admitting: Family Medicine

## 2019-03-17 ENCOUNTER — Ambulatory Visit (INDEPENDENT_AMBULATORY_CARE_PROVIDER_SITE_OTHER): Payer: Medicare Other | Admitting: Family Medicine

## 2019-03-17 ENCOUNTER — Other Ambulatory Visit: Payer: Self-pay

## 2019-03-17 VITALS — BP 140/82 | HR 80 | Temp 97.8°F | Wt 354.2 lb

## 2019-03-17 DIAGNOSIS — Z9889 Other specified postprocedural states: Secondary | ICD-10-CM | POA: Diagnosis not present

## 2019-03-17 DIAGNOSIS — M1990 Unspecified osteoarthritis, unspecified site: Secondary | ICD-10-CM

## 2019-03-17 DIAGNOSIS — G894 Chronic pain syndrome: Secondary | ICD-10-CM | POA: Diagnosis not present

## 2019-03-17 MED ORDER — TRAMADOL HCL 50 MG PO TABS: 100.0000 mg | ORAL_TABLET | Freq: Four times a day (QID) | ORAL | 1 refills | Status: DC | PRN

## 2019-03-17 NOTE — Progress Notes (Signed)
   Subjective:    Patient ID: Aaron Castro, male    DOB: 20-Apr-1951, 68 y.o.   MRN: [id number]  HPI He is here for a follow-up visit.  Over the last several months he has had cervical as well as lumbar decompressions.  He states that he is doing much better and walking at least a half an hour on a daily basis.  He is using a cane but using it less often.  He has lost over 20 pounds during this timeframe. He states that his pain now is mainly arthritis related in his shoulders, knees and ankles Presently he is taking gabapentin, Voltaren and tramadol.  He is no longer taking Valium or using codeine.  His last prescription for tramadol from Dr. Ellene Route was September 5.  He takes  4 pills/day.  Prior to his surgery he was taking roughly the same number of pills.  He is very happy with the progress that he has made.   Review of Systems     Objective:   Physical Exam Alert and in no distress.  He is now walking with a cane and has a slightly antalgic gait.       Assessment & Plan:  History of excision of lamina of cervical vertebra for decompression of spinal cord  History of lumbar laminectomy for spinal cord decompression  Chronic pain syndrome - Plan: traMADol (ULTRAM) 50 MG tablet  Arthritis - Plan: traMADol (ULTRAM) 50 MG tablet Since he is now postop and doing well, I will take over writing his Ultram.  I explained that if he continues to lose weight, his arthritis pain should slowly diminish and therefore his need for tramadol.  I will also send a note from Dr. Ellene Route concerning this.

## 2019-03-30 ENCOUNTER — Other Ambulatory Visit: Payer: Self-pay | Admitting: Family Medicine

## 2019-03-30 DIAGNOSIS — N401 Enlarged prostate with lower urinary tract symptoms: Secondary | ICD-10-CM

## 2019-04-01 DIAGNOSIS — G959 Disease of spinal cord, unspecified: Secondary | ICD-10-CM | POA: Diagnosis not present

## 2019-04-01 DIAGNOSIS — I10 Essential (primary) hypertension: Secondary | ICD-10-CM | POA: Diagnosis not present

## 2019-05-31 ENCOUNTER — Other Ambulatory Visit: Payer: Self-pay | Admitting: Family Medicine

## 2019-05-31 DIAGNOSIS — I10 Essential (primary) hypertension: Secondary | ICD-10-CM

## 2019-06-28 ENCOUNTER — Other Ambulatory Visit: Payer: Self-pay | Admitting: Family Medicine

## 2019-06-28 DIAGNOSIS — N401 Enlarged prostate with lower urinary tract symptoms: Secondary | ICD-10-CM

## 2019-07-06 ENCOUNTER — Other Ambulatory Visit: Payer: Self-pay | Admitting: Family Medicine

## 2019-07-06 DIAGNOSIS — G894 Chronic pain syndrome: Secondary | ICD-10-CM

## 2019-07-06 NOTE — Telephone Encounter (Signed)
[facility] is requesting to fill pt gabapentin. Please advise KH 

## 2019-07-28 ENCOUNTER — Other Ambulatory Visit: Payer: Self-pay | Admitting: Family Medicine

## 2019-07-28 DIAGNOSIS — G894 Chronic pain syndrome: Secondary | ICD-10-CM

## 2019-07-28 DIAGNOSIS — M1990 Unspecified osteoarthritis, unspecified site: Secondary | ICD-10-CM

## 2019-07-28 NOTE — Telephone Encounter (Signed)
[facility] is requesting to fill pt tramadol. Please advise [facility] 

## 2019-08-20 ENCOUNTER — Other Ambulatory Visit: Payer: Self-pay | Admitting: Family Medicine

## 2019-08-20 DIAGNOSIS — N529 Male erectile dysfunction, unspecified: Secondary | ICD-10-CM

## 2019-08-20 NOTE — Telephone Encounter (Signed)
[facility] is requesting to fill pt sildenafil. Please advise KH 

## 2019-09-26 ENCOUNTER — Other Ambulatory Visit: Payer: Self-pay | Admitting: Family Medicine

## 2019-09-26 DIAGNOSIS — R3911 Hesitancy of micturition: Secondary | ICD-10-CM

## 2019-09-26 DIAGNOSIS — N401 Enlarged prostate with lower urinary tract symptoms: Secondary | ICD-10-CM

## 2019-09-28 NOTE — Telephone Encounter (Signed)
[facility] is requesting to fill pt finasteride . Please advise to to pt not being seen in out office in a while . KH

## 2019-10-26 ENCOUNTER — Other Ambulatory Visit: Payer: Self-pay | Admitting: Family Medicine

## 2019-10-26 DIAGNOSIS — I10 Essential (primary) hypertension: Secondary | ICD-10-CM

## 2019-10-30 ENCOUNTER — Other Ambulatory Visit: Payer: Self-pay | Admitting: Family Medicine

## 2019-10-30 DIAGNOSIS — M1990 Unspecified osteoarthritis, unspecified site: Secondary | ICD-10-CM

## 2019-10-30 DIAGNOSIS — G894 Chronic pain syndrome: Secondary | ICD-10-CM

## 2019-10-30 NOTE — Telephone Encounter (Signed)
Set up a virtual appointment

## 2019-11-02 ENCOUNTER — Ambulatory Visit (INDEPENDENT_AMBULATORY_CARE_PROVIDER_SITE_OTHER): Payer: Medicare Other | Admitting: Family Medicine

## 2019-11-02 ENCOUNTER — Encounter: Payer: Self-pay | Admitting: Family Medicine

## 2019-11-02 DIAGNOSIS — G894 Chronic pain syndrome: Secondary | ICD-10-CM

## 2019-11-02 DIAGNOSIS — M1990 Unspecified osteoarthritis, unspecified site: Secondary | ICD-10-CM

## 2019-11-02 MED ORDER — TRAMADOL HCL 50 MG PO TABS: ORAL_TABLET | ORAL | 1 refills | Status: AC

## 2019-11-02 NOTE — Telephone Encounter (Signed)
Please refuse . KH 

## 2019-11-02 NOTE — Progress Notes (Signed)
   Subjective:    Patient ID: Aaron Castro, male    DOB: Nov 21, 1950, 69 y.o.   MRN: [id number]  HPI This is for a medication management visit.  He has had recent neck and back surgery and seems to be doing well with that.  He complains mainly of joint pains and muscle pains.  He has lost down to 337 pounds.  Is taking Voltaren 75 twice daily and been taking tramadol 4 times daily for several years.  He does not use Tylenol.   Review of Systems     Objective:   Physical Exam  Alert and in no distress.      Assessment & Plan:  Chronic pain syndrome  Arthritis I think he is gotten in the habit of taking the tramadol whether he needs it or not.  We will have him take Voltaren 75 mg twice daily, add Tylenol 2 tablets 4 times per day and cut back on tramadol to 3 times daily.  He will call me in 1 month and we will reevaluate the situation.

## 2019-11-11 DIAGNOSIS — R29898 Other symptoms and signs involving the musculoskeletal system: Secondary | ICD-10-CM | POA: Diagnosis not present

## 2019-11-11 DIAGNOSIS — G959 Disease of spinal cord, unspecified: Secondary | ICD-10-CM | POA: Diagnosis not present

## 2019-11-16 ENCOUNTER — Other Ambulatory Visit: Payer: Self-pay | Admitting: Neurological Surgery

## 2019-11-16 ENCOUNTER — Other Ambulatory Visit (HOSPITAL_COMMUNITY): Payer: Self-pay | Admitting: Neurological Surgery

## 2019-11-16 DIAGNOSIS — R29898 Other symptoms and signs involving the musculoskeletal system: Secondary | ICD-10-CM

## 2019-11-20 ENCOUNTER — Telehealth: Payer: Self-pay

## 2019-11-20 NOTE — Telephone Encounter (Signed)
Pt. Aware he has 1 refill left and he can call the pharmacy

## 2019-11-20 NOTE — Telephone Encounter (Signed)
Pt. Called stating he needs a refill on his Tramadol called in to the [facility] on [address] pt. Last apt. Was 11/02/19. He said his back has gone out again and he is waiting to get an MRI done but that's not until 11/30/19.

## 2019-11-20 NOTE — Telephone Encounter (Signed)
he has a refill

## 2019-11-30 ENCOUNTER — Other Ambulatory Visit: Payer: Self-pay

## 2019-11-30 ENCOUNTER — Ambulatory Visit (HOSPITAL_COMMUNITY)
Admission: RE | Admit: 2019-11-30 | Discharge: 2019-11-30 | Disposition: A | Discharge status: Home / Self Care | Payer: Medicare Other | Source: Ambulatory Visit | Attending: Neurological Surgery | Admitting: Neurological Surgery

## 2019-11-30 DIAGNOSIS — M48061 Spinal stenosis, lumbar region without neurogenic claudication: Secondary | ICD-10-CM | POA: Diagnosis not present

## 2019-11-30 DIAGNOSIS — R29898 Other symptoms and signs involving the musculoskeletal system: Secondary | ICD-10-CM | POA: Diagnosis not present

## 2019-11-30 IMAGING — MR MR LUMBAR SPINE WO/W CM
5 of 7 series · 29 of 48 positions shown · IV contrast (with contrast)
Comparison: MRI lumbar spine [DATE].

CLINICAL DATA: Bilateral lower extremity weakness. Back pain.
History of prior lumbar surgery.

EXAM:
MRI LUMBAR SPINE WITHOUT AND WITH CONTRAST
TECHNIQUE: Multiplanar and multiecho pulse sequences of the lumbar spine were
obtained without and with intravenous contrast.
CONTRAST:  10 mL GADAVIST IV SOLN

[Series 5: T1 · sagittal · 4.0mm · 0.88mm/px · 5 of 17 slices shown (1 of 2)]
[im 1/17]
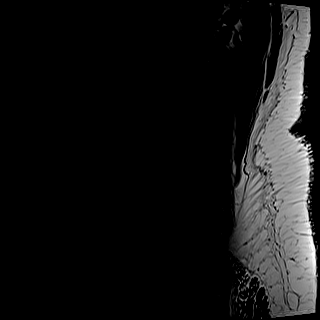
[im 5/17]
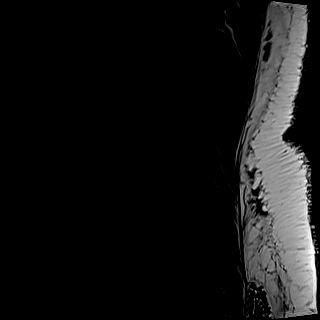
[im 9/17]
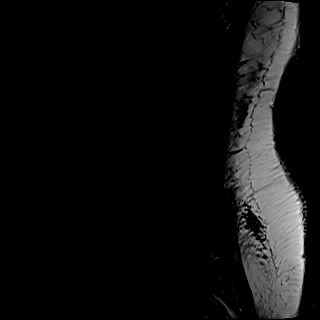
[im 13/17]
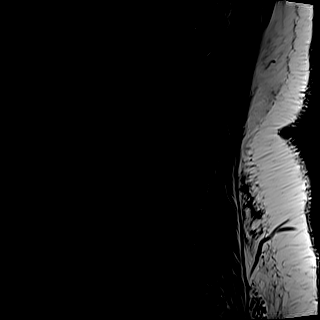
[im 17/17]
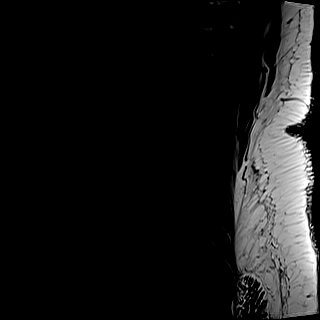

[Series 7: T2 · axial · 4.0mm · 0.69mm/px · z∈[-122,+96]mm · 8 of 40 slices shown]
[im 1/40]
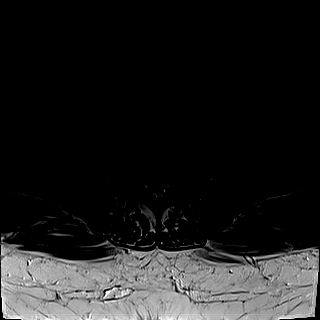
[im 5/40]
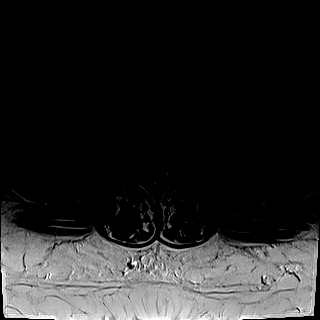
[im 14/40]
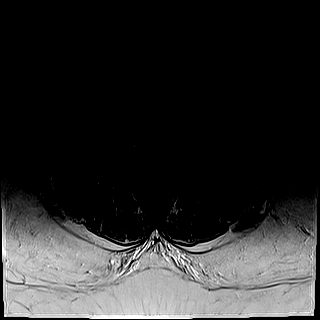
[im 18/40]
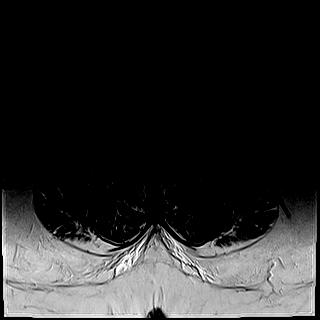
[im 22/40]
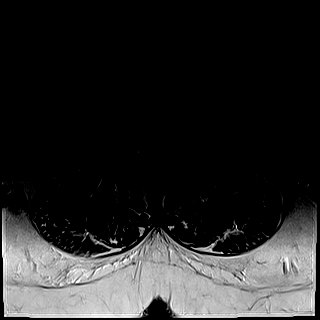
[im 27/40]
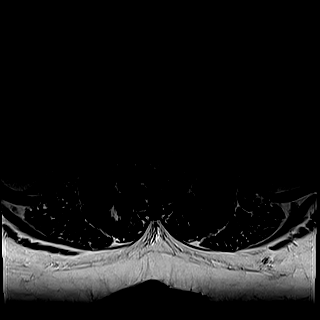
[im 35/40]
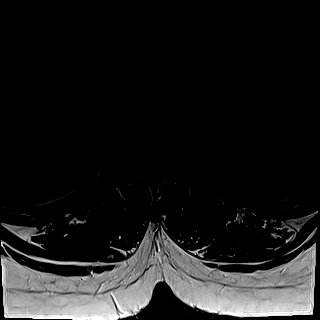
[im 40/40]
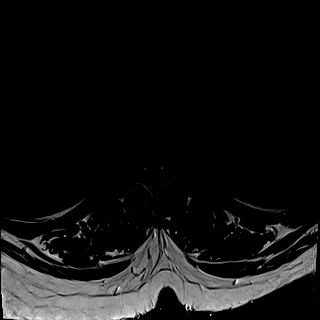

[Series 8: T1 · axial · 4.0mm · 0.43mm/px · z∈[-122,+96]mm · 8 of 40 slices shown (2 of 2)]
[im 1/40]
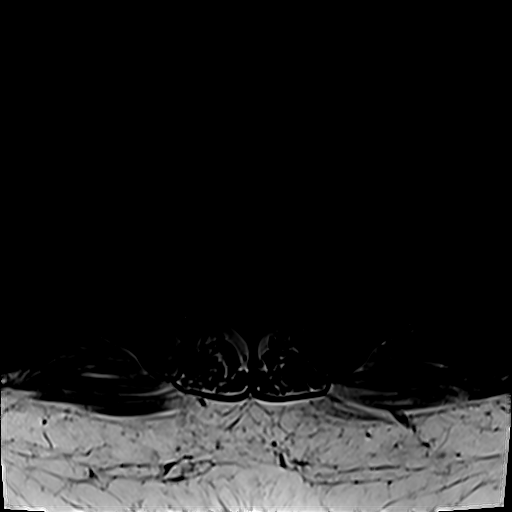
[im 5/40]
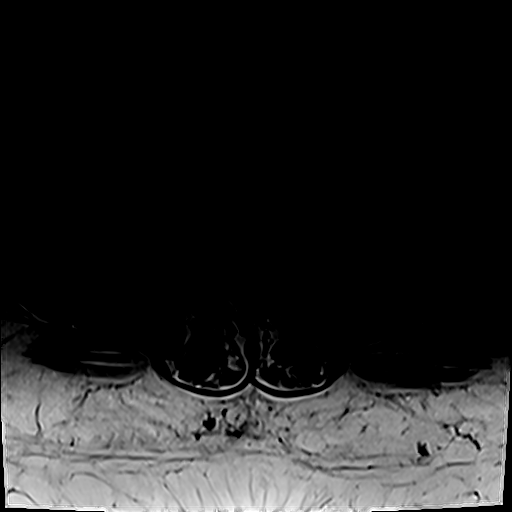
[im 14/40]
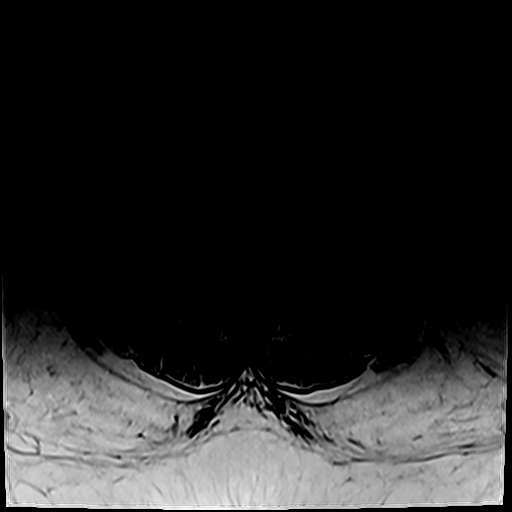
[im 18/40]
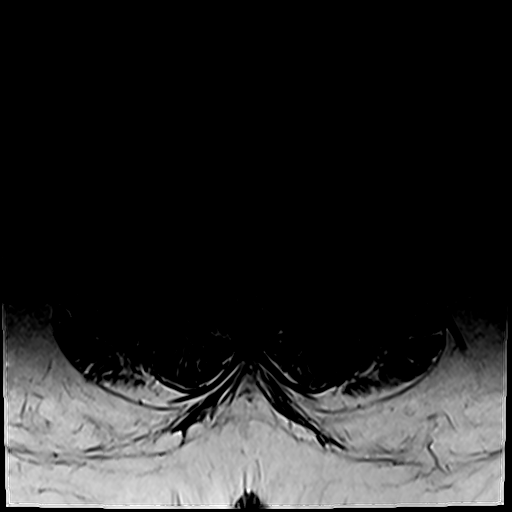
[im 22/40]
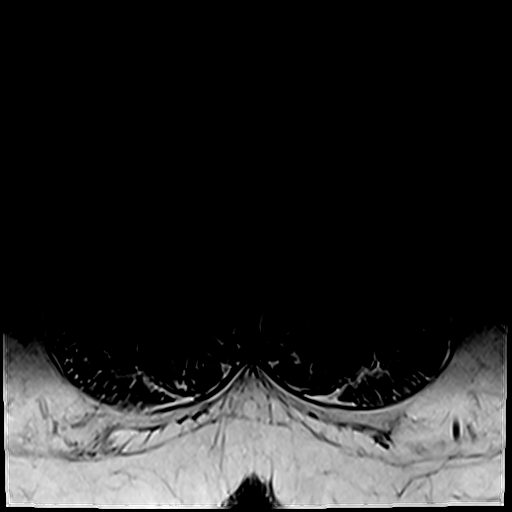
[im 27/40]
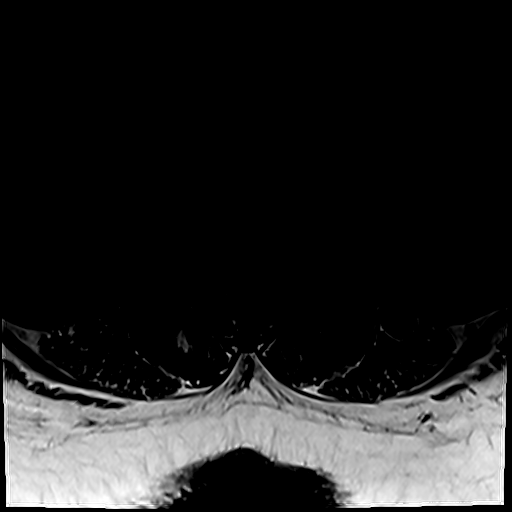
[im 35/40]
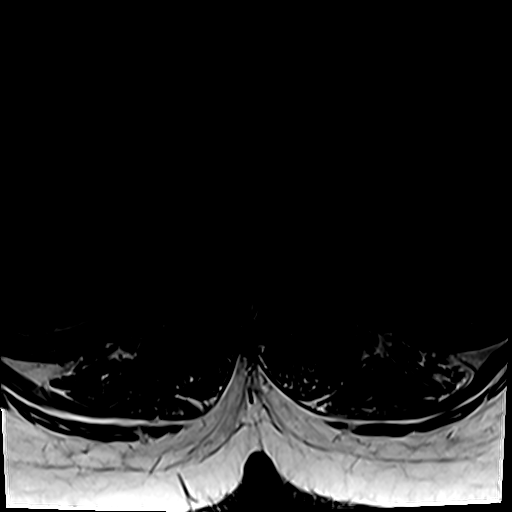
[im 40/40]
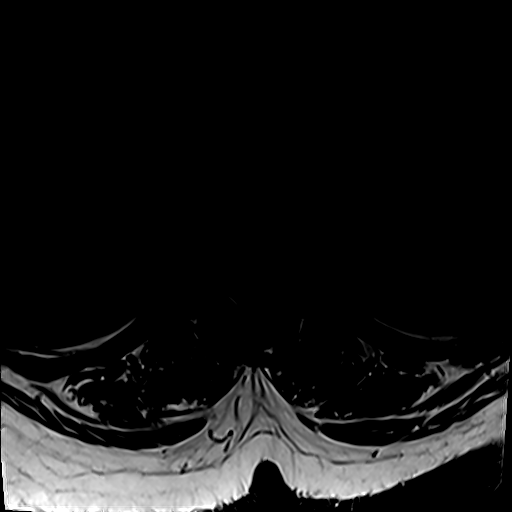

[Series 9: T2 post-contrast · sagittal · 4.0mm · 0.88mm/px · 4 of 17 slices shown]
[im 1/17]
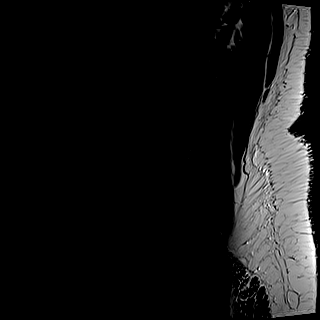
[im 6/17]
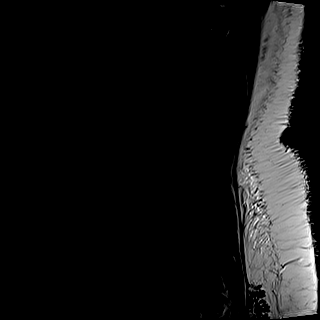
[im 11/17]
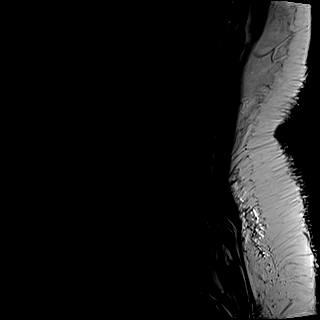
[im 17/17]
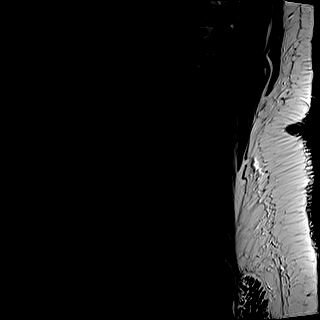

[Series 10: T1 fat-sat post-contrast · sagittal · 4.0mm · 0.88mm/px · 4 of 17 slices shown]
[im 1/17]
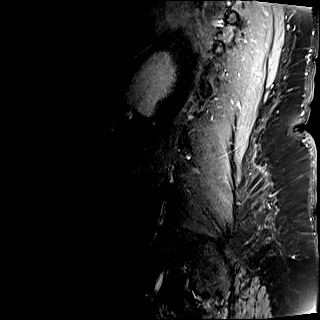
[im 6/17]
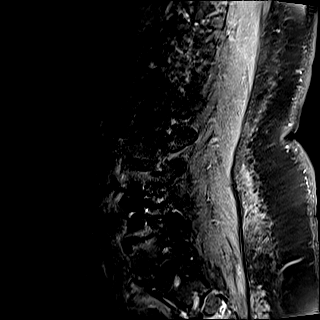
[im 11/17]
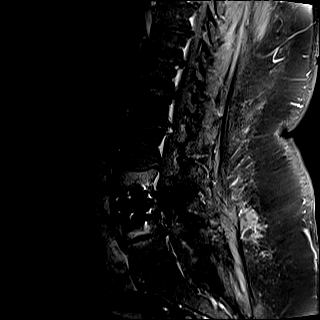
[im 17/17]
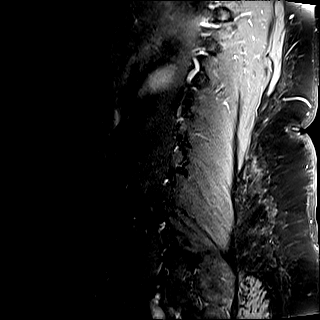

[29 of 48 positions shown; findings below may reference images not displayed]

FINDINGS: Segmentation: Rudimentary ribs off the first lumbar type vertebral
body are incidentally noted. Last fully open disc space is labeled
L5-S1 as on the prior exam.

Alignment:  Maintained.

Vertebrae: No fracture, evidence of discitis, or bone lesion.
Degenerative endplate signal change L4-5 and L5-S1 noted.
Congenitally narrow central canal in the lower thoracic spine
through approximately L4-5 due to short pedicle length is again
seen.

Conus medullaris and cauda equina: Conus extends to the L2 level.
Conus medullaris appears normal. The cauda equina demonstrate
buckling as on the prior exam.

Paraspinal and other soft tissues: Left renal cyst noted.

Disc levels:

T10-11 is imaged in the sagittal plane only. There is a disc bulge
and ligamentum flavum thickening. The cord is deformed at T10-11 and
there appears to be abnormal signal within the cord.

T11-12: Broad-based disc bulge, ligamentum flavum thickening and
mild to moderate facet arthropathy. There is mild flattening of the
ventral cord and mild to moderate central canal stenosis. Mild to
moderate bilateral foraminal narrowing is also seen.

T12-L1: Shallow left paracentral protrusion and facet degenerative
change. The central canal and foramina are open.

L1-2: Disc bulge with a superimposed right paracentral and lateral
recess protrusion. Facet arthropathy and ligamentum flavum
thickening. Mild central canal and moderate right subarticular
recess narrowing are seen. The foramina are open. No marked change.

L2-3: Mild-to-moderate facet arthropathy, minimal disc bulge and
ligamentum flavum thickening. No stenosis.

L3-4: The patient has undergone discectomy with an interbody spacer
and left lateral approach plate and screws in place. Moderately
severe to severe spinal stenosis appears unchanged. Moderate to
moderately severe bilateral foraminal narrowing also appears
unchanged. Facet joint effusions seen on the prior exam have
resolved.

L4-5: Loss of disc space height with a bulge and endplate spur. Mild
to moderate central canal stenosis and right worse than left
subarticular recess narrowing. Moderate bilateral foraminal
narrowing is worse on the right.

L5-S1: Shallow disc bulge with endplate spurring, more prominent to
the left. The central canal is open. Moderately severe to severe
foraminal narrowing is worse on the left.
IMPRESSION: T10-11 is imaged in the sagittal plane only but there appears to be
new edema within the cord at this level due to a disc bulge and
ligamentum flavum thickening. Dedicated thoracic spine MRI is
recommended for further evaluation.

Congenitally narrow central canal in the lower thoracic spine
through approximately L4-5.

Status post L3-4 discectomy and fusion since the prior study.
Moderately severe to severe central canal stenosis and moderate to
moderately severe bilateral foraminal narrowing due to bulky
ligamentum flavum thickening and a shallow disc bulge again seen.

Mild central canal and moderate right subarticular recess narrowing
at L1-2 where there is a disc bulge and superimposed right
paracentral protrusion.

No change in moderately severe to severe left worse than right
foraminal narrowing at L5-S1.

## 2019-11-30 MED ORDER — GADOBUTROL 1 MMOL/ML IV SOLN
10.0000 mL | Freq: Once | INTRAVENOUS | Status: AC | PRN
  Administered 2019-11-30: 10 mL via INTRAVENOUS

## 2019-12-02 DIAGNOSIS — R29898 Other symptoms and signs involving the musculoskeletal system: Secondary | ICD-10-CM | POA: Diagnosis not present

## 2019-12-02 DIAGNOSIS — M4804 Spinal stenosis, thoracic region: Secondary | ICD-10-CM | POA: Diagnosis not present

## 2019-12-10 ENCOUNTER — Other Ambulatory Visit (HOSPITAL_COMMUNITY): Payer: Self-pay | Admitting: Neurological Surgery

## 2019-12-10 ENCOUNTER — Other Ambulatory Visit: Payer: Self-pay | Admitting: Neurological Surgery

## 2019-12-10 DIAGNOSIS — M4804 Spinal stenosis, thoracic region: Secondary | ICD-10-CM

## 2019-12-24 ENCOUNTER — Ambulatory Visit (HOSPITAL_COMMUNITY)
Admission: RE | Admit: 2019-12-24 | Discharge: 2019-12-24 | Disposition: A | Discharge status: Home / Self Care | Payer: Medicare Other | Source: Ambulatory Visit | Attending: Neurological Surgery | Admitting: Neurological Surgery

## 2019-12-24 ENCOUNTER — Other Ambulatory Visit: Payer: Self-pay

## 2019-12-24 DIAGNOSIS — M4804 Spinal stenosis, thoracic region: Secondary | ICD-10-CM | POA: Insufficient documentation

## 2019-12-24 DIAGNOSIS — M4724 Other spondylosis with radiculopathy, thoracic region: Secondary | ICD-10-CM | POA: Diagnosis not present

## 2019-12-24 DIAGNOSIS — M5013 Cervical disc disorder with radiculopathy, cervicothoracic region: Secondary | ICD-10-CM | POA: Diagnosis not present

## 2019-12-24 DIAGNOSIS — M5114 Intervertebral disc disorders with radiculopathy, thoracic region: Secondary | ICD-10-CM | POA: Diagnosis not present

## 2019-12-24 IMAGING — MR MR THORACIC SPINE W/O CM
6 of 7 series · 35 of 48 positions shown · non-contrast
Comparison: None.

CLINICAL DATA: Thoracic spinal stenosis.

EXAM:
MRI THORACIC SPINE WITHOUT CONTRAST
TECHNIQUE: Multiplanar, multisequence MR imaging of the thoracic spine was
performed. No intravenous contrast was administered.

[Series 16: T1 · sagittal · 4.0mm · 1.72mm/px · 1 of 5 slices shown (1 of 2)]
[im 1/5]
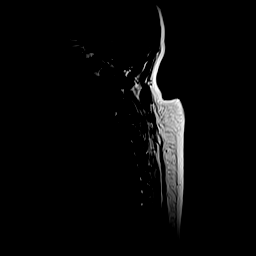

[Series 17: STIR · sagittal · 3.0mm · 1.06mm/px · 4 of 15 slices shown]
[im 1/15]
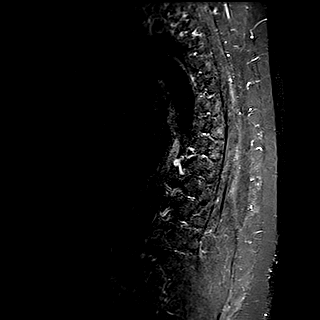
[im 5/15]
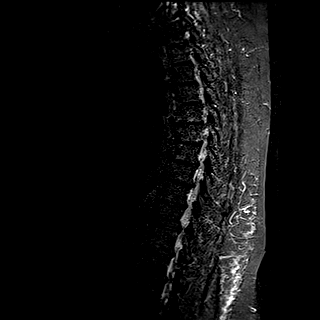
[im 10/15]
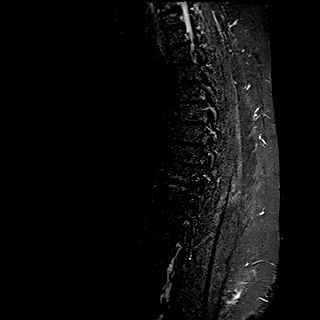
[im 15/15]
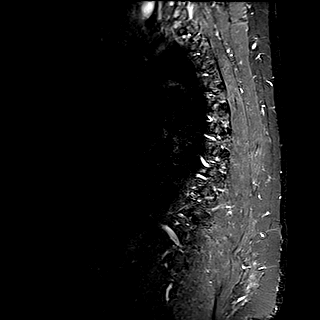

[Series 18: T1 · sagittal · 3.0mm · 1.00mm/px · 4 of 15 slices shown (2 of 2)]
[im 1/15]
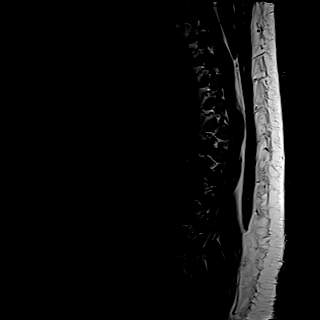
[im 5/15]
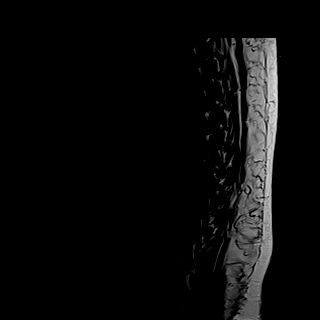
[im 10/15]
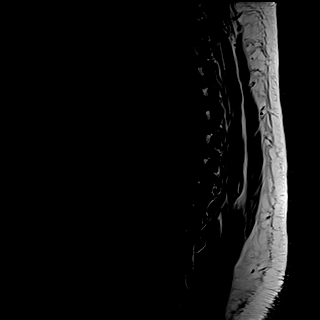
[im 15/15]
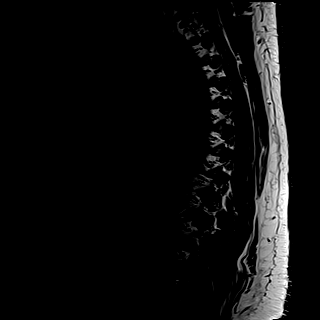

[Series 19: T2 · sagittal · 3.0mm · 0.83mm/px · 4 of 15 slices shown (1 of 2)]
[im 1/15]
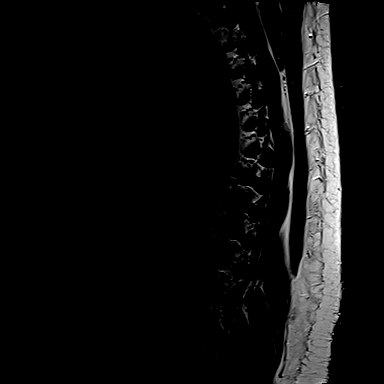
[im 5/15]
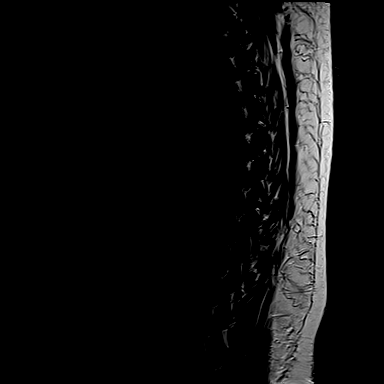
[im 10/15]
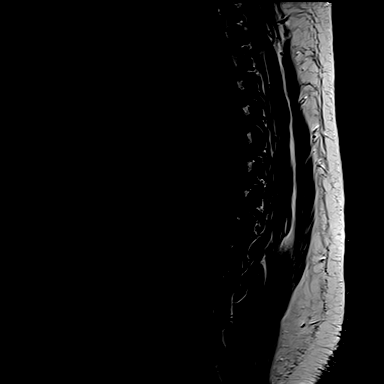
[im 15/15]
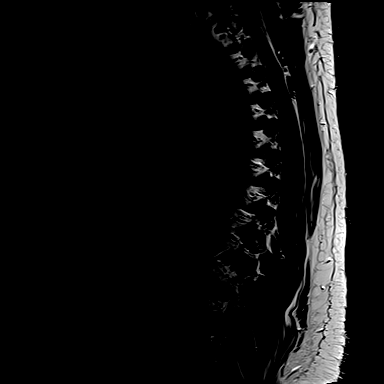

[Series 20: T2 · axial · 4.0mm · 0.78mm/px · z∈[-264,-15]mm · 14 of 50 slices shown (2 of 2)]
[im 1/50]
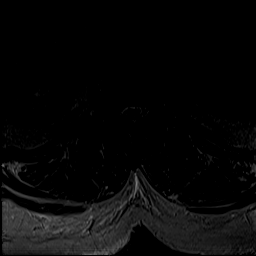
[im 4/50]
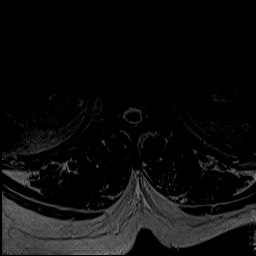
[im 8/50]
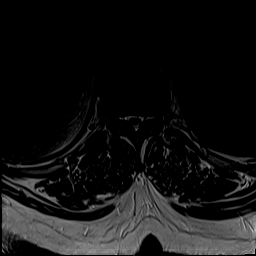
[im 11/50]
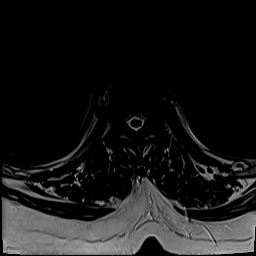
[im 15/50]
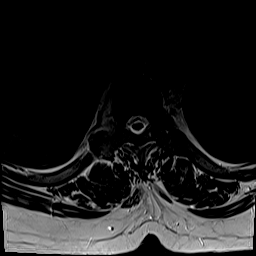
[im 18/50]
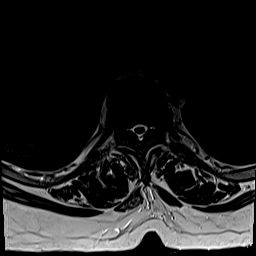
[im 22/50]
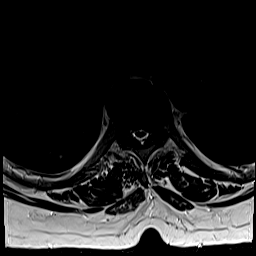
[im 25/50]
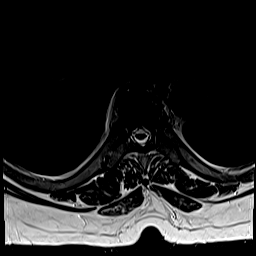
[im 29/50]
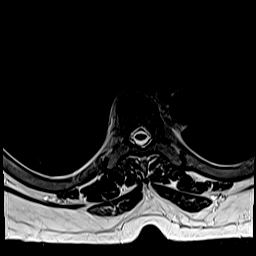
[im 32/50]
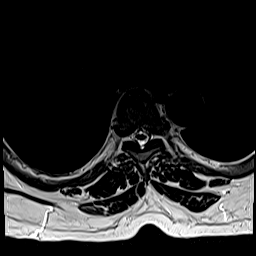
[im 36/50]
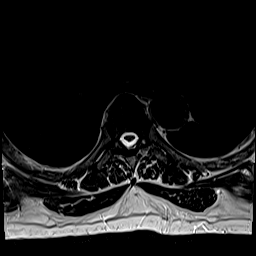
[im 39/50]
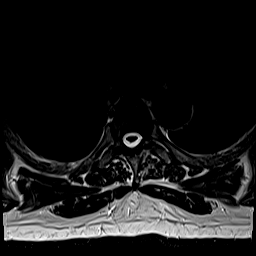
[im 43/50]
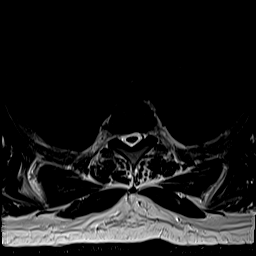
[im 50/50]
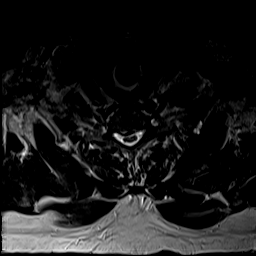

[Series 21: t2_me2d_tra · axial · 4.0mm · 0.39mm/px · z∈[-264,-15]mm · 8 of 50 slices shown]
[im 1/50]
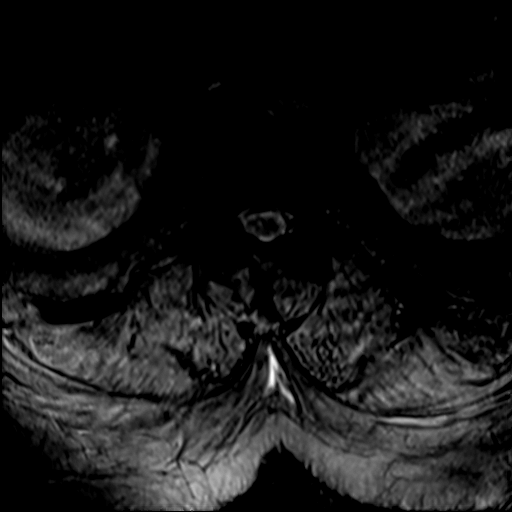
[im 8/50]
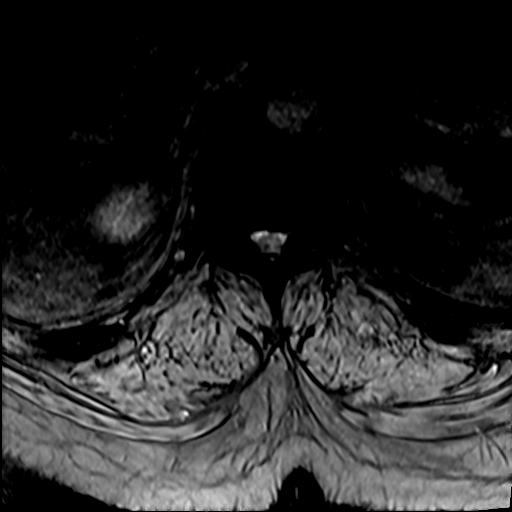
[im 15/50]
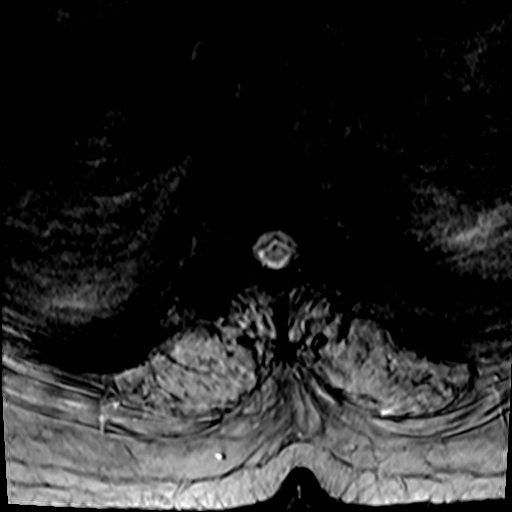
[im 22/50]
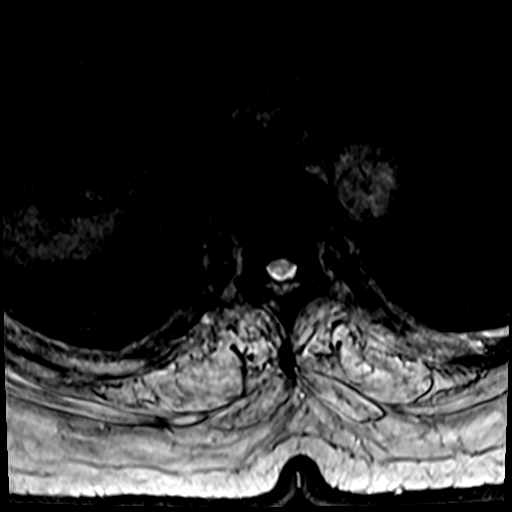
[im 29/50]
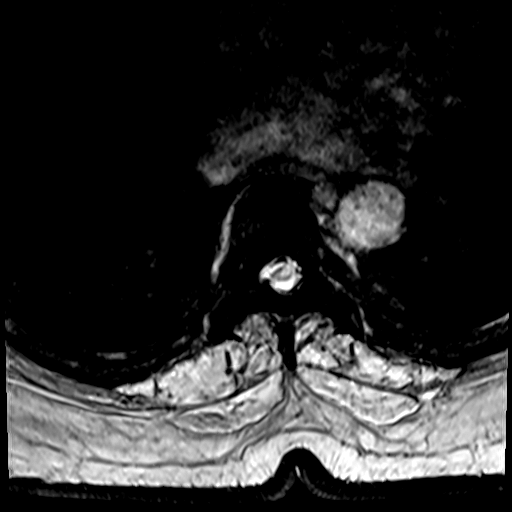
[im 36/50]
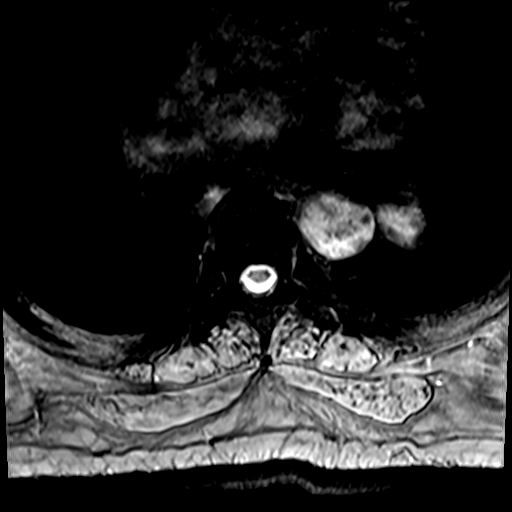
[im 43/50]
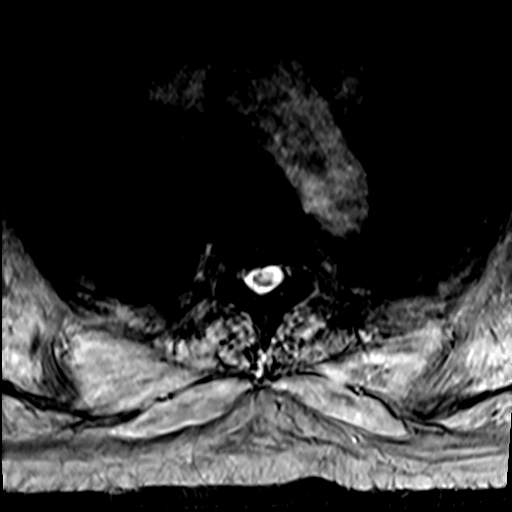
[im 50/50]
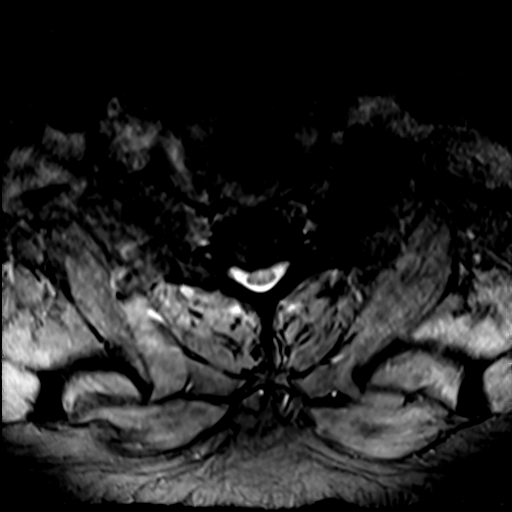

[35 of 48 positions shown; findings below may reference images not displayed]

FINDINGS: Alignment:  Physiologic.

Vertebrae: No fracture, evidence of discitis, or bone lesion.

Cord: Flattening of the anterior cord at C7-T1, T6-7 and T10-11. No
abnormal cord signal.

Paraspinal and other soft tissues: Negative.

Disc levels:

C7-T1: Posterior disc protrusion causing mild flattening of the cord
without cord signal abnormality or significant spinal canal
stenosis. No significant neural foraminal narrowing.

T1-2: Right central disc protrusion. No significant spinal canal or
neural foraminal stenosis.

T2-3: No spinal canal or neural foraminal stenosis.

T3-4: Tiny posterior disc protrusion. No spinal canal or neural
foraminal stenosis.

T4-5: Tiny posterior disc protrusion. No spinal canal or neural
foraminal stenosis.

T5-6: Small posterior disc protrusion and facet degenerative changes
without significant spinal canal or neural foraminal stenosis.

T6-7: Posterior disc protrusion causing mild flattening of the
anterior cord. There are also facet degenerative changes. No
significant spinal canal or neural foraminal stenosis.

T7-8: Small posterior disc protrusion and facet degenerative changes
without significant spinal canal or neural foraminal stenosis.

T8-9: Posterior disc protrusion and facet degenerative changes
resulting mild spinal canal stenosis and moderate bilateral neural
foraminal narrowing.

T9-10: Small posterior disc protrusion and facet degenerative
changes resulting in mild spinal canal stenosis and mild-to-moderate
bilateral neural foraminal narrowing.

T10-11: Posterior disc protrusion, facet degenerative changes and
ligamentum flavum redundancy resulting in moderate to severe spinal
canal stenosis, moderate right and mild left neural foraminal
narrowing.

T11-12: Posterior disc protrusion, facet degenerative changes and
ligamentum flavum redundancy resulting in mild spinal canal stenosis
and mild bilateral neural foraminal narrowing.
IMPRESSION: 1. Multilevel degenerative changes of the thoracic spine as
described above with moderate to severe spinal canal stenosis at
T10-11 with moderate right and mild left neural foraminal narrowing.
2. Mild spinal canal stenosis at T8-9, T9-10, and T11-12.

## 2019-12-25 ENCOUNTER — Other Ambulatory Visit (HOSPITAL_COMMUNITY): Payer: Self-pay | Admitting: Neurological Surgery

## 2019-12-25 DIAGNOSIS — M4804 Spinal stenosis, thoracic region: Secondary | ICD-10-CM

## 2019-12-25 DIAGNOSIS — I10 Essential (primary) hypertension: Secondary | ICD-10-CM | POA: Diagnosis not present

## 2019-12-29 ENCOUNTER — Telehealth: Payer: Self-pay | Admitting: Family Medicine

## 2019-12-29 NOTE — Telephone Encounter (Signed)
Pt called and needs refill on Tramadol sent to the [facility] on [address]

## 2019-12-29 NOTE — Telephone Encounter (Signed)
Appt made Kh 

## 2019-12-29 NOTE — Telephone Encounter (Signed)
Lets set up a virtual visit to see how he is doing with his pain control.

## 2019-12-30 ENCOUNTER — Encounter: Payer: Self-pay | Admitting: Family Medicine

## 2019-12-30 ENCOUNTER — Other Ambulatory Visit: Payer: Self-pay

## 2019-12-30 ENCOUNTER — Telehealth (INDEPENDENT_AMBULATORY_CARE_PROVIDER_SITE_OTHER): Payer: Medicare Other | Admitting: Family Medicine

## 2019-12-30 VITALS — Temp 98.0°F | Wt 337.0 lb

## 2019-12-30 DIAGNOSIS — Z9889 Other specified postprocedural states: Secondary | ICD-10-CM

## 2019-12-30 DIAGNOSIS — G894 Chronic pain syndrome: Secondary | ICD-10-CM | POA: Diagnosis not present

## 2019-12-30 NOTE — Progress Notes (Signed)
   Subjective:    Patient ID: Aaron Castro, male    DOB: April 18, 1951, 69 y.o.   MRN: [id number]  HPI I connected with  Tedd Cottrill Killion on 12/30/19 by a video enabled telemedicine application and verified that I am speaking with the correct person using two identifiers. I discussed the limitations of evaluation and management by telemedicine. The patient expressed understanding and agreed to proceed by phone as he does not have a computer. He is being cared for by Dr. Danielle Dess for continued difficulty with his back.  Apparently now he has a lesion in the T10-11 area causing him a significant amount of pain.  He has not been taking tramadol in the last 2 weeks.  Apparently Dr. Danielle Dess gave him oxycodone.    Review of Systems     Objective:   Physical Exam Alert and complaining of pain.       Assessment & Plan:  Chronic pain syndrome  History of lumbar laminectomy for spinal cord decompression It sounds as if he is having some thoracic cord issue at this time.  I will defer further pain management to Dr. Danielle Dess.  I explained this to the patient and he understands any further pain medication will need to come from Dr. Danielle Dess. 10 minutes spent reviewing medical records and discussing the case with him.

## 2020-01-04 ENCOUNTER — Other Ambulatory Visit: Payer: Self-pay | Admitting: Neurological Surgery

## 2020-01-06 NOTE — Pre-Procedure Instructions (Signed)
[id number] [place], [place] - [address] AT [facility] OF [address] [place] [address] Phone: [phone] Fax: [phone]  [facility] [address], [place] - [address] [place] [address] Phone: [phone] Fax: [phone]    Your procedure is scheduled on Tues., January 12, 2020 from 7:30AM-12:37PM  Report to [facility] Entrance "A" at 5:30AM  Call this number if you have problems the morning of surgery:  [phone]   Remember:  Do not eat or drink after midnight on July 19th    Take these medicines the morning of surgery with A SIP OF WATER: AmLODipine (NORVASC)      Finasteride (PROSCAR)      Gabapentin (NEURONTIN)  As of today, STOP taking all Aspirin (unless instructed by your doctor) and Other Aspirin containing products, Vitamins, Fish oils, and Herbal medications. Also stop all NSAIDS i.e. Advil, Ibuprofen, Motrin, Aleve, Anaprox, Naproxen, BC, Goody Powders, and all Supplements. Including: Diclofenac (VOLTAREN)  No Smoking of any kind, Tobacco, or Alcohol products 24 hours prior to your procedure. If you use a Cpap at night, you may bring all equipment for your overnight stay.   Special instructions:   [place]- Preparing For Surgery  Before surgery, you can play an important role. Because skin is not sterile, your skin needs to be as free of germs as possible. You can reduce the number of germs on your skin by washing with CHG (chlorahexidine gluconate) Soap before surgery.  CHG is an antiseptic cleaner which kills germs and bonds with the skin to continue killing germs even after washing.    Please do not use if you have an allergy to CHG or antibacterial soaps. If your skin becomes reddened/irritated stop using the CHG.  Do not shave (including legs and underarms) for at least 48 hours prior to first CHG shower. It is OK to shave  your face.  Please follow these instructions carefully.   1. Shower the NIGHT BEFORE SURGERY and the MORNING OF SURGERY with CHG.   2. If you chose to wash your hair, wash your hair first as usual with your normal shampoo.  3. After you shampoo, rinse your hair and body thoroughly to remove the shampoo.  4. Use CHG as you would any other liquid soap. You can apply CHG directly to the skin and wash gently with a scrungie or a clean washcloth.   5. Apply the CHG Soap to your body ONLY FROM THE NECK DOWN.  Do not use on open wounds or open sores. Avoid contact with your eyes, ears, mouth and genitals (private parts). Wash Face and genitals (private parts)  with your normal soap.  6. Wash thoroughly, paying special attention to the area where your surgery will be performed.  7. Thoroughly rinse your body with warm water from the neck down.  8. DO NOT shower/wash with your normal soap after using and rinsing off the CHG Soap.  9. Pat yourself dry with a CLEAN TOWEL.  10. Wear CLEAN PAJAMAS to bed the night before surgery, wear comfortable clothes the morning of surgery  11. Place CLEAN SHEETS on your bed the night of your first shower and DO NOT SLEEP WITH PETS.   Day of Surgery:            Remember to brush your teeth WITH YOUR REGULAR TOOTHPASTE.  Do not wear jewelry.  Do not wear lotions, powders, colognes, or deodorant.  Do not shave 48 hours prior to surgery.  Men may shave face and neck.  Do not bring valuables to the hospital.  [facility] is not responsible for any belongings or valuables.  Contacts, dentures or bridgework may not be worn into surgery.   For patients admitted to the hospital, discharge time will be determined by your treatment team.  Patients discharged the day of surgery will not be allowed to drive home, and someone age 71 and over needs to stay with them for 24 hours.  Please wear clean clothes to the hospital/surgery center.    Please read over the  following fact sheets that you were given.

## 2020-01-07 ENCOUNTER — Encounter (HOSPITAL_COMMUNITY)
Admission: RE | Admit: 2020-01-07 | Discharge: 2020-01-07 | Disposition: A | Discharge status: Home / Self Care | Payer: Medicare Other | Source: Ambulatory Visit | Attending: Neurological Surgery | Admitting: Neurological Surgery

## 2020-01-07 ENCOUNTER — Encounter (HOSPITAL_COMMUNITY): Payer: Self-pay

## 2020-01-07 ENCOUNTER — Other Ambulatory Visit: Payer: Self-pay

## 2020-01-07 ENCOUNTER — Ambulatory Visit (HOSPITAL_COMMUNITY)
Admission: RE | Admit: 2020-01-07 | Discharge: 2020-01-07 | Disposition: A | Discharge status: Home / Self Care | Payer: Medicare Other | Source: Ambulatory Visit | Attending: Neurological Surgery | Admitting: Neurological Surgery

## 2020-01-07 DIAGNOSIS — I700 Atherosclerosis of aorta: Secondary | ICD-10-CM | POA: Diagnosis not present

## 2020-01-07 DIAGNOSIS — M4804 Spinal stenosis, thoracic region: Secondary | ICD-10-CM | POA: Insufficient documentation

## 2020-01-07 DIAGNOSIS — M47814 Spondylosis without myelopathy or radiculopathy, thoracic region: Secondary | ICD-10-CM | POA: Diagnosis not present

## 2020-01-07 DIAGNOSIS — I2510 Atherosclerotic heart disease of native coronary artery without angina pectoris: Secondary | ICD-10-CM | POA: Diagnosis not present

## 2020-01-07 LAB — SURGICAL PCR SCREEN
MRSA, PCR: NEGATIVE
Staphylococcus aureus: POSITIVE — AB

## 2020-01-07 LAB — CBC
HCT: 45.4 % (ref 39.0–52.0)
Hemoglobin: 14.2 g/dL (ref 13.0–17.0)
MCH: 27.7 pg (ref 26.0–34.0)
MCHC: 31.3 g/dL (ref 30.0–36.0)
MCV: 88.7 fL (ref 80.0–100.0)
Platelets: 246 10*3/uL (ref 150–400)
RBC: 5.12 MIL/uL (ref 4.22–5.81)
RDW: 14 % (ref 11.5–15.5)
WBC: 8.6 10*3/uL (ref 4.0–10.5)
nRBC: 0 % (ref 0.0–0.2)

## 2020-01-07 LAB — BASIC METABOLIC PANEL
Anion gap: 10 (ref 5–15)
BUN: 16 mg/dL (ref 8–23)
CO2: 26 mmol/L (ref 22–32)
Calcium: 9.4 mg/dL (ref 8.9–10.3)
Chloride: 103 mmol/L (ref 98–111)
Creatinine, Ser: 0.89 mg/dL (ref 0.61–1.24)
GFR calc Af Amer: >60 mL/min (ref >60)
GFR calc non Af Amer: >60 mL/min (ref >60)
Glucose, Bld: 118 mg/dL — ABNORMAL HIGH (ref 70–99)
Potassium: 3.9 mmol/L (ref 3.5–5.1)
Sodium: 139 mmol/L (ref 135–145)

## 2020-01-07 LAB — TYPE AND SCREEN
ABO/RH(D): A POS
Antibody Screen: NEGATIVE

## 2020-01-07 IMAGING — CT CT T SPINE W/O CM
3 of 4 series · 11 of 33 positions shown, 13 images · non-contrast
Comparison: Thoracic spine MRI [DATE]

CLINICAL DATA: Thoracic spinal stenosis. [facility]STBOY protocol for
operative planning.

EXAM:
CT THORACIC SPINE WITHOUT CONTRAST
TECHNIQUE: Multidetector CT images of the thoracic were obtained using the
standard protocol without intravenous contrast.

[Series 4: t-spine 2.0 st · axial · 0.35mm/px · z∈[+1186,+1420]mm · 4 of 170 slices shown, 5 images]
[im 27/170  soft-tissue]
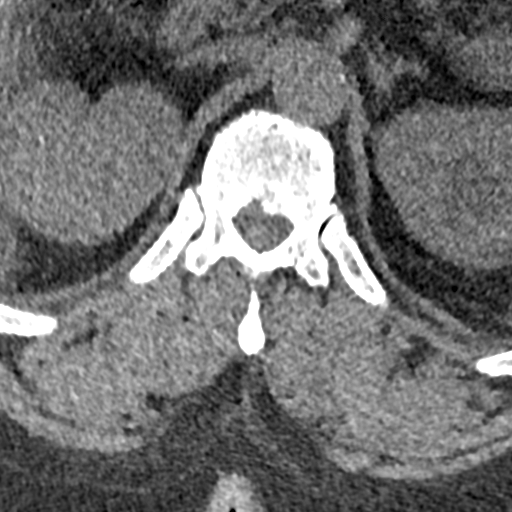
[im 27/170  bone]
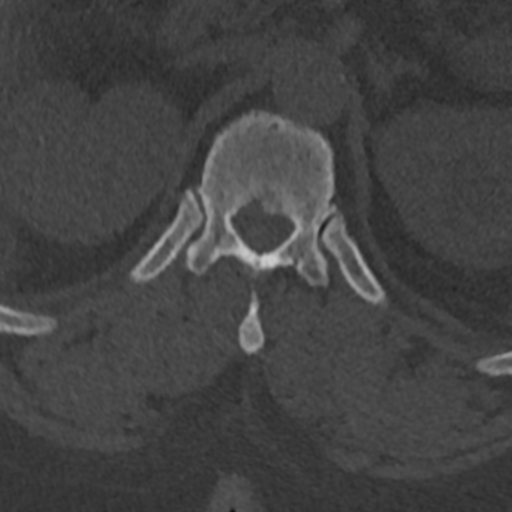
[im 66/170  bone]
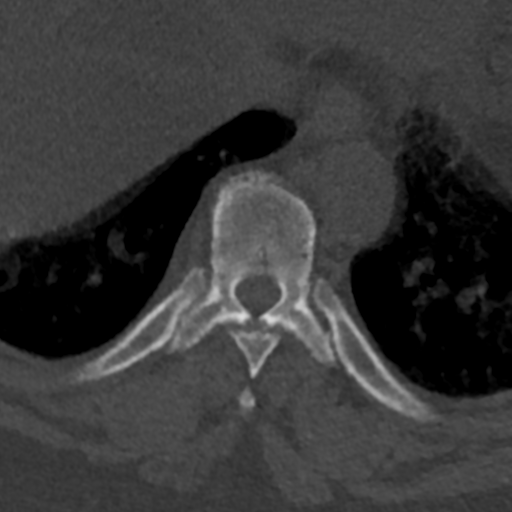
[im 105/170  bone]
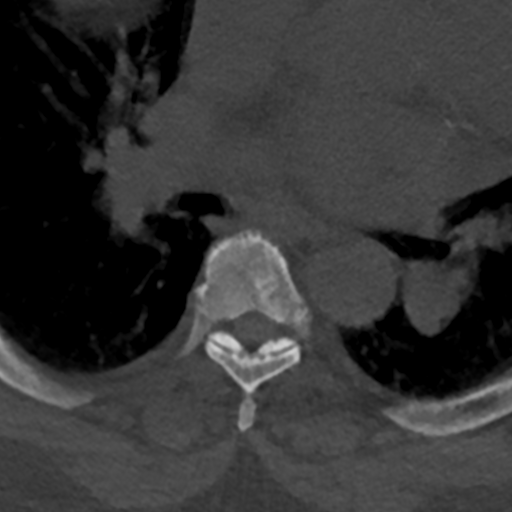
[im 144/170  bone]
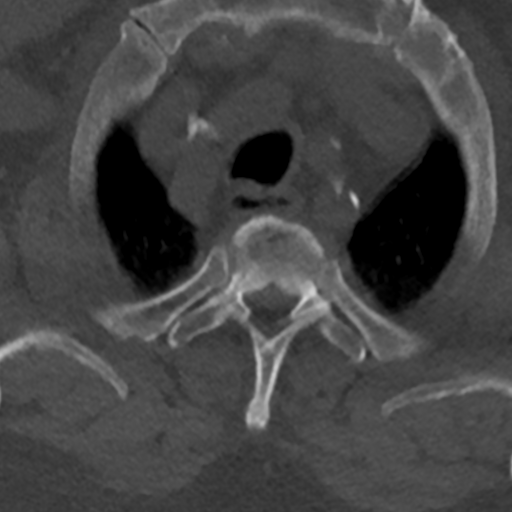

[Series 9: coronal st · coronal · 0.30mm/px · 2 of 119 slices shown]
[im 40/119  bone]
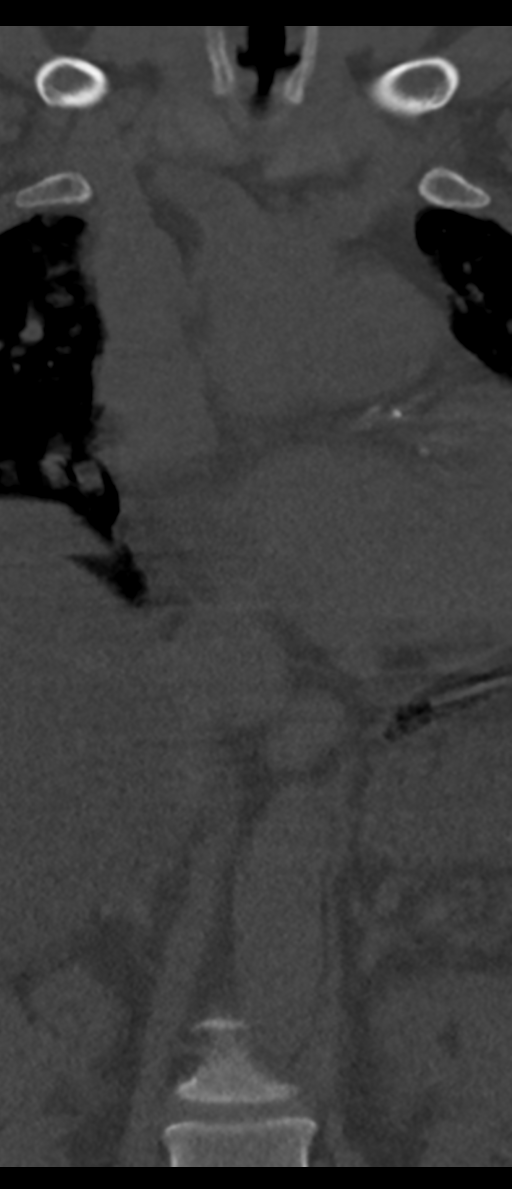
[im 79/119  bone]
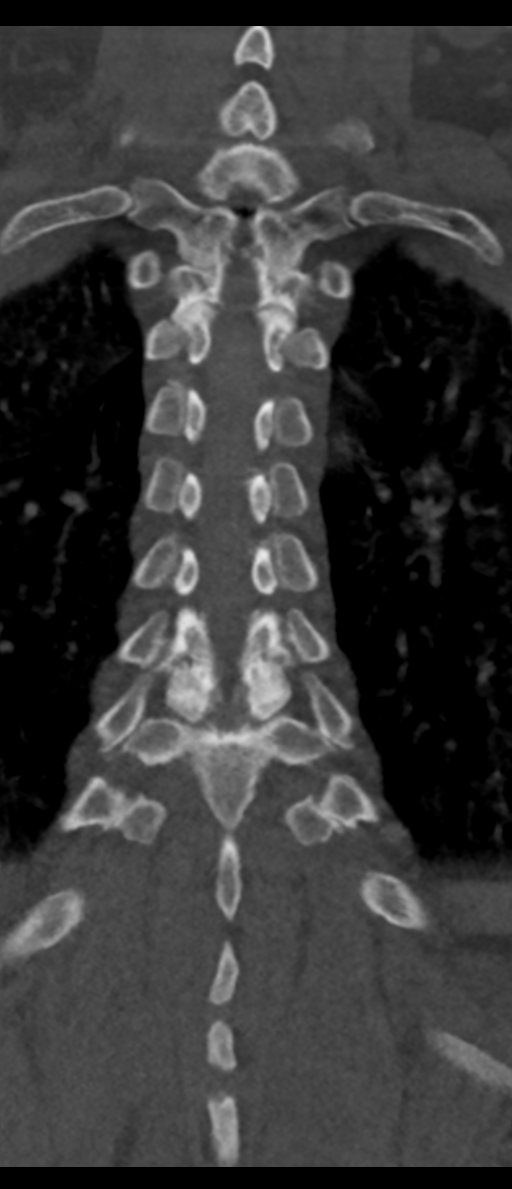

[Series 10: sagittal st · sagittal · 0.39mm/px · 5 of 61 slices shown, 6 images]
[im 21/61  bone]
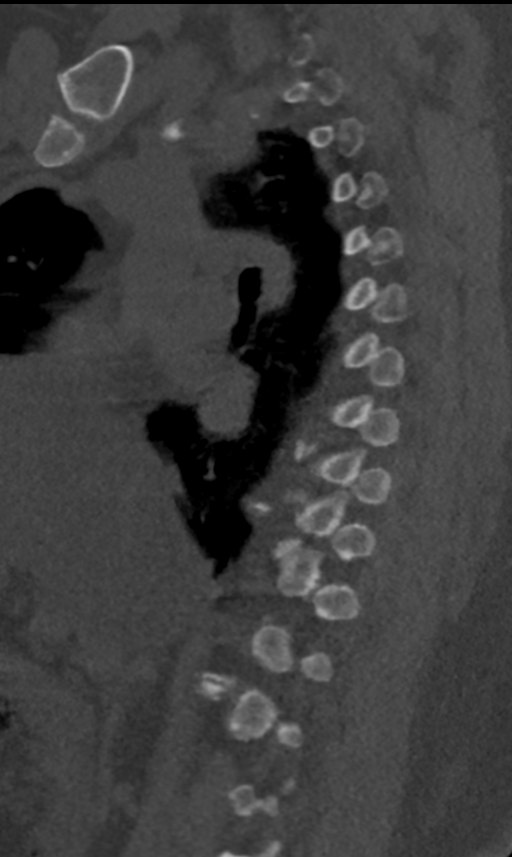
[im 26/61  bone]
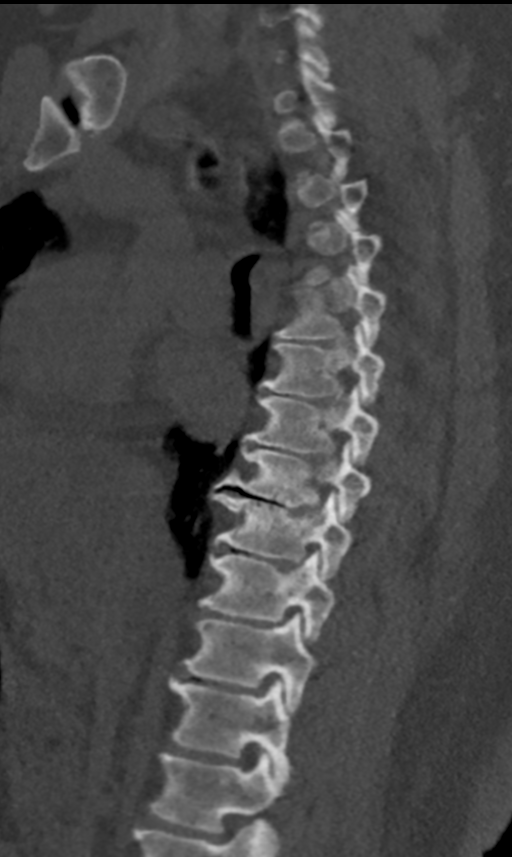
[im 31/61  soft-tissue]
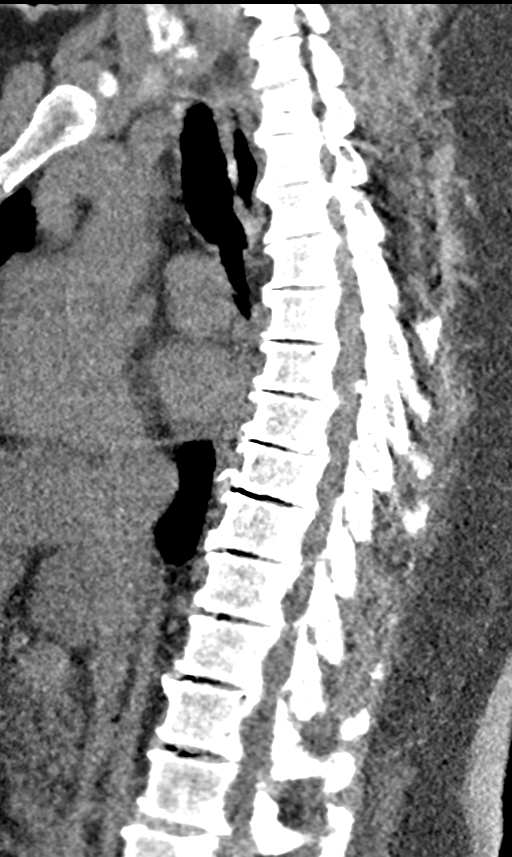
[im 31/61  bone]
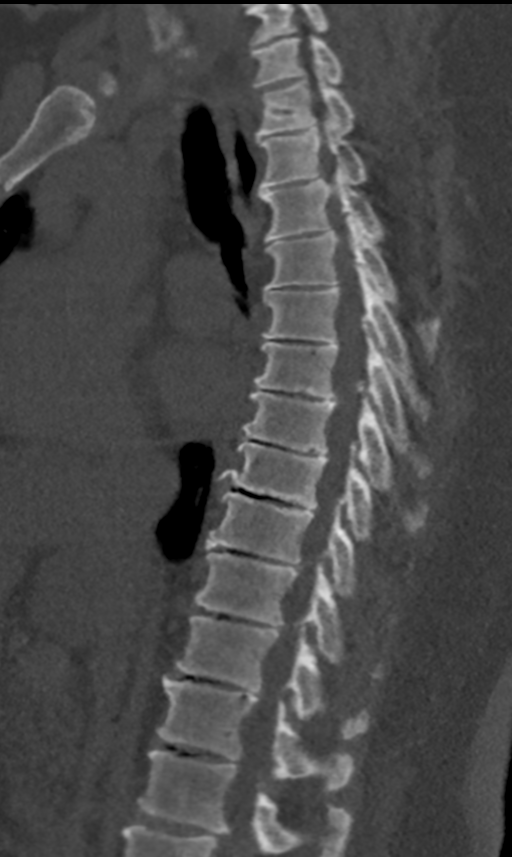
[im 36/61  bone]
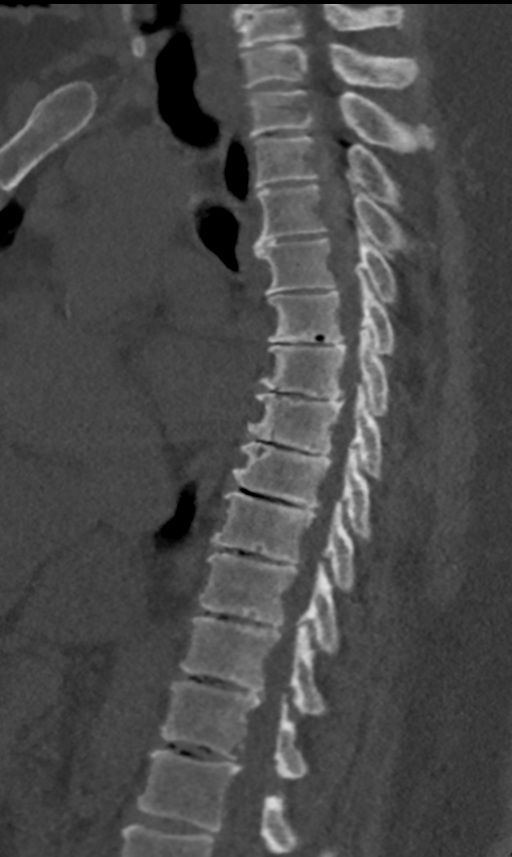
[im 41/61  bone]
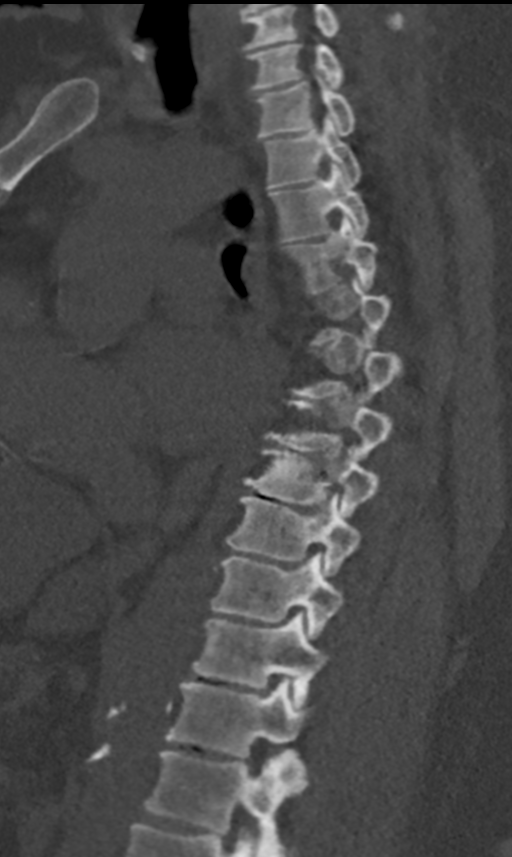

[11 of 33 positions shown; findings below may reference images not displayed]

FINDINGS: Alignment: Mild thoracic dextroscoliosis.  No listhesis.

Vertebrae: No acute fracture or suspicious osseous lesion.

Paraspinal and other soft tissues: Aortic and coronary
atherosclerosis. Diffusely decreased attenuation of the included
portion of the liver compatible with steatosis.

Disc levels: Widespread disc degeneration throughout the thoracic
spine including moderately advanced disc space narrowing in the mid
and lower thoracic spine with vacuum disc, degenerative endplate
sclerosis, spurring, and small Schmorl's nodes. Mild thoracic facet
arthrosis. Detailed level by level assessment of degenerative
changes and associated spinal and neural foraminal stenosis is
deferred to the recent prior MRI.
IMPRESSION: 1. Widespread thoracic disc degeneration as detailed on the recent
prior MRI.
2. Hepatic steatosis.
3. Aortic Atherosclerosis ([01]-[01]).

## 2020-01-07 NOTE — Progress Notes (Signed)
PCP - Dr. Susann Givens Cardiologist - denies   Chest x-ray - N/A EKG - 01/07/20 Stress Test - denies ECHO - 01/29/06 Cardiac Cath - denies  Sleep Study - denies  Aspirin Instructions: Patient instructed to hold all Aspirin, NSAID's, herbal medications, fish oil and vitamins 7 days prior to surgery.     COVID TEST- 01/09/20 at [facility]. Pt instructed to remain in their car. Educated on Haematologist until [employer]    Anesthesia review:   Patient denies shortness of breath, fever, cough and chest pain at PAT appointment   All instructions explained to the patient, with a verbal understanding of the material. Patient agrees to go over the instructions while at home for a better understanding. Patient also instructed to self quarantine after being tested for COVID-19. The opportunity to ask questions was provided.

## 2020-01-09 ENCOUNTER — Other Ambulatory Visit (HOSPITAL_COMMUNITY)
Admission: RE | Admit: 2020-01-09 | Discharge: 2020-01-09 | Disposition: A | Discharge status: Home / Self Care | Payer: Medicare Other | Source: Ambulatory Visit | Attending: Neurological Surgery | Admitting: Neurological Surgery

## 2020-01-09 DIAGNOSIS — M4714 Other spondylosis with myelopathy, thoracic region: Secondary | ICD-10-CM | POA: Diagnosis not present

## 2020-01-09 DIAGNOSIS — Z841 Family history of disorders of kidney and ureter: Secondary | ICD-10-CM | POA: Diagnosis not present

## 2020-01-09 DIAGNOSIS — I10 Essential (primary) hypertension: Secondary | ICD-10-CM | POA: Diagnosis not present

## 2020-01-09 DIAGNOSIS — M1990 Unspecified osteoarthritis, unspecified site: Secondary | ICD-10-CM | POA: Diagnosis not present

## 2020-01-09 DIAGNOSIS — K649 Unspecified hemorrhoids: Secondary | ICD-10-CM | POA: Diagnosis not present

## 2020-01-09 DIAGNOSIS — M4804 Spinal stenosis, thoracic region: Secondary | ICD-10-CM | POA: Diagnosis not present

## 2020-01-09 DIAGNOSIS — Z981 Arthrodesis status: Secondary | ICD-10-CM | POA: Diagnosis not present

## 2020-01-09 DIAGNOSIS — Z01812 Encounter for preprocedural laboratory examination: Secondary | ICD-10-CM | POA: Insufficient documentation

## 2020-01-09 DIAGNOSIS — M4324 Fusion of spine, thoracic region: Secondary | ICD-10-CM | POA: Diagnosis not present

## 2020-01-09 DIAGNOSIS — Z79899 Other long term (current) drug therapy: Secondary | ICD-10-CM | POA: Diagnosis not present

## 2020-01-09 DIAGNOSIS — Z20822 Contact with and (suspected) exposure to covid-19: Secondary | ICD-10-CM | POA: Diagnosis not present

## 2020-01-09 DIAGNOSIS — Z87891 Personal history of nicotine dependence: Secondary | ICD-10-CM | POA: Diagnosis not present

## 2020-01-09 LAB — SARS CORONAVIRUS 2 (TAT 6-24 HRS): SARS Coronavirus 2: NEGATIVE

## 2020-01-11 MED ORDER — DEXTROSE 5 % IV SOLN
3.0000 g | INTRAVENOUS | Status: AC
  Administered 2020-01-12: 3 g via INTRAVENOUS
  Filled 2020-01-11: qty 3

## 2020-01-11 NOTE — Anesthesia Preprocedure Evaluation (Addendum)
Anesthesia Evaluation  Patient identified by MRN, date of birth, ID band Patient awake    Reviewed: Allergy & Precautions, NPO status , Patient's Chart, lab work & pertinent test results  Airway Mallampati: II  TM Distance: >3 FB Neck ROM: Full    Dental  (+) Dental Advisory Given   Pulmonary former smoker,    breath sounds clear to auscultation       Cardiovascular hypertension, Pt. on medications + DOE   Rhythm:Regular Rate:Normal     Neuro/Psych  Neuromuscular disease    GI/Hepatic negative GI ROS, Neg liver ROS,   Endo/Other  Morbid obesity  Renal/GU negative Renal ROS     Musculoskeletal  (+) Arthritis ,   Abdominal   Peds  Hematology   Anesthesia Other Findings   Reproductive/Obstetrics                            Lab Results  Component Value Date   WBC 8.6 01/07/2020   HGB 14.2 01/07/2020   HCT 45.4 01/07/2020   MCV 88.7 01/07/2020   PLT 246 01/07/2020   Lab Results  Component Value Date   CREATININE 0.89 01/07/2020   BUN 16 01/07/2020   NA 139 01/07/2020   K 3.9 01/07/2020   CL 103 01/07/2020   CO2 26 01/07/2020    Anesthesia Physical Anesthesia Plan  ASA: III  Anesthesia Plan: General   Post-op Pain Management:    Induction: Intravenous  PONV Risk Score and Plan: 2 and Dexamethasone, Ondansetron and Treatment may vary due to age or medical condition  Airway Management Planned: Oral ETT  Additional Equipment:   Intra-op Plan:   Post-operative Plan: Extubation in OR and Possible Post-op intubation/ventilation  Informed Consent: I have reviewed the patients History and Physical, chart, labs and discussed the procedure including the risks, benefits and alternatives for the proposed anesthesia with the patient or authorized representative who has indicated his/her understanding and acceptance.     Dental advisory given  Plan Discussed with:  CRNA  Anesthesia Plan Comments:        Anesthesia Quick Evaluation

## 2020-01-12 ENCOUNTER — Encounter (HOSPITAL_COMMUNITY): Payer: Self-pay | Admitting: Neurological Surgery

## 2020-01-12 ENCOUNTER — Inpatient Hospital Stay (HOSPITAL_COMMUNITY): Payer: Medicare Other

## 2020-01-12 ENCOUNTER — Encounter (HOSPITAL_COMMUNITY)
Admission: RE | Disposition: A | Discharge status: Home Health | Payer: Self-pay | Source: Home / Self Care | Attending: Neurological Surgery

## 2020-01-12 ENCOUNTER — Inpatient Hospital Stay (HOSPITAL_COMMUNITY): Payer: Medicare Other | Admitting: Anesthesiology

## 2020-01-12 ENCOUNTER — Other Ambulatory Visit: Payer: Self-pay

## 2020-01-12 ENCOUNTER — Inpatient Hospital Stay (HOSPITAL_COMMUNITY)
Admission: RE | Admit: 2020-01-12 | Discharge: 2020-01-13 | DRG: 460 | Disposition: A | Discharge status: Home Health | Payer: Medicare Other | Attending: Neurological Surgery | Admitting: Neurological Surgery

## 2020-01-12 ENCOUNTER — Inpatient Hospital Stay (HOSPITAL_COMMUNITY): Payer: Medicare Other | Admitting: Physician Assistant

## 2020-01-12 DIAGNOSIS — Z841 Family history of disorders of kidney and ureter: Secondary | ICD-10-CM | POA: Diagnosis not present

## 2020-01-12 DIAGNOSIS — R531 Weakness: Secondary | ICD-10-CM | POA: Diagnosis present

## 2020-01-12 DIAGNOSIS — M1990 Unspecified osteoarthritis, unspecified site: Secondary | ICD-10-CM | POA: Diagnosis not present

## 2020-01-12 DIAGNOSIS — M4324 Fusion of spine, thoracic region: Secondary | ICD-10-CM | POA: Diagnosis not present

## 2020-01-12 DIAGNOSIS — Z20822 Contact with and (suspected) exposure to covid-19: Secondary | ICD-10-CM | POA: Diagnosis present

## 2020-01-12 DIAGNOSIS — M4804 Spinal stenosis, thoracic region: Secondary | ICD-10-CM | POA: Diagnosis present

## 2020-01-12 DIAGNOSIS — Z87891 Personal history of nicotine dependence: Secondary | ICD-10-CM

## 2020-01-12 DIAGNOSIS — M4714 Other spondylosis with myelopathy, thoracic region: Principal | ICD-10-CM | POA: Diagnosis present

## 2020-01-12 DIAGNOSIS — Z981 Arthrodesis status: Secondary | ICD-10-CM | POA: Diagnosis not present

## 2020-01-12 DIAGNOSIS — Z79899 Other long term (current) drug therapy: Secondary | ICD-10-CM

## 2020-01-12 DIAGNOSIS — Z811 Family history of alcohol abuse and dependence: Secondary | ICD-10-CM | POA: Diagnosis not present

## 2020-01-12 DIAGNOSIS — G992 Myelopathy in diseases classified elsewhere: Secondary | ICD-10-CM | POA: Diagnosis present

## 2020-01-12 DIAGNOSIS — K649 Unspecified hemorrhoids: Secondary | ICD-10-CM | POA: Diagnosis not present

## 2020-01-12 DIAGNOSIS — Z419 Encounter for procedure for purposes other than remedying health state, unspecified: Secondary | ICD-10-CM

## 2020-01-12 DIAGNOSIS — Z6841 Body Mass Index (BMI) 40.0 and over, adult: Secondary | ICD-10-CM | POA: Diagnosis not present

## 2020-01-12 DIAGNOSIS — I10 Essential (primary) hypertension: Secondary | ICD-10-CM | POA: Diagnosis present

## 2020-01-12 HISTORY — PX: APPLICATION OF ROBOTIC ASSISTANCE FOR SPINAL PROCEDURE: SHX6753

## 2020-01-12 IMAGING — RF DG THORACIC SPINE 2V
1 series · 2 of 2 positions shown · non-contrast
Comparison: [DATE].

CLINICAL DATA: T10-11 decompression.

EXAM:
THORACIC SPINE 2 VIEWS; DG C-ARM 1-60 MIN
Radiation exposure index: 33.27 mGy.

[Series 1: run · 2 of 2 slices shown]
[im 1/2]
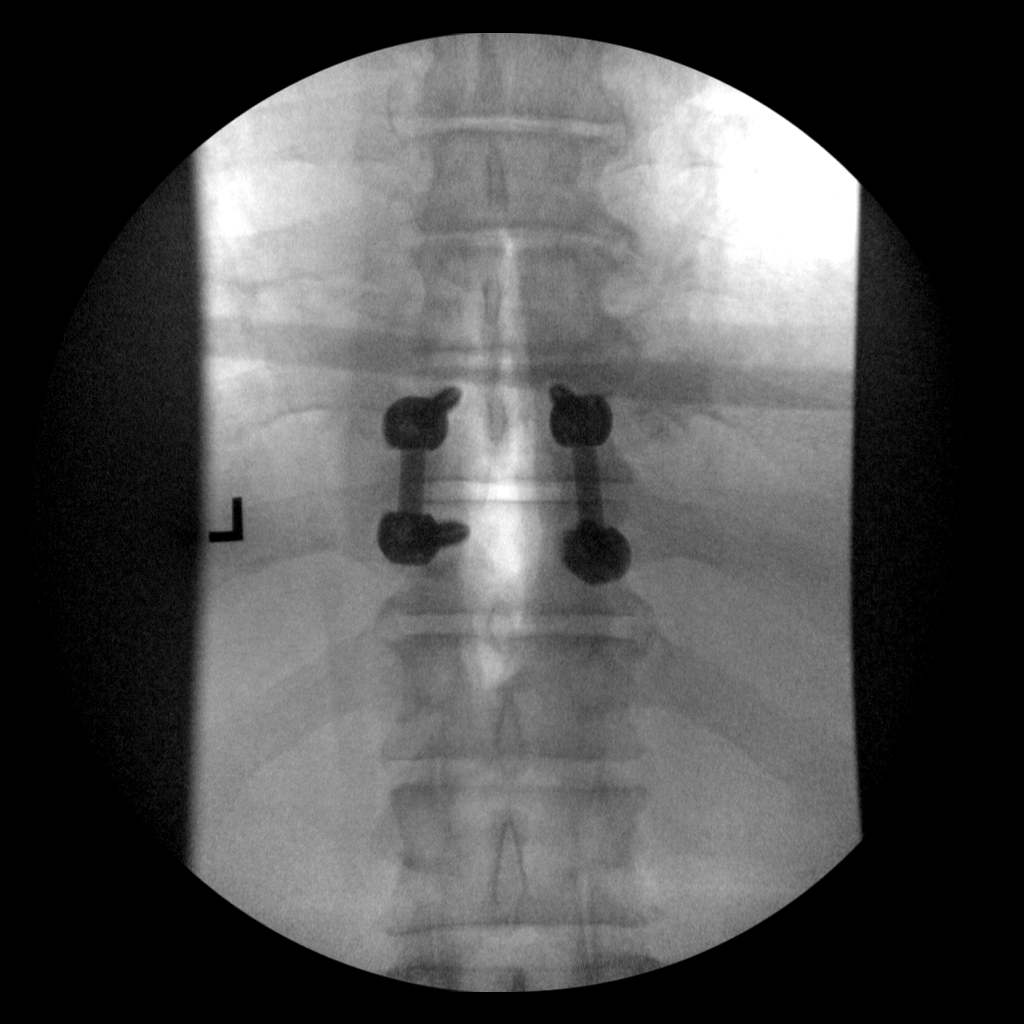
[im 2/2]
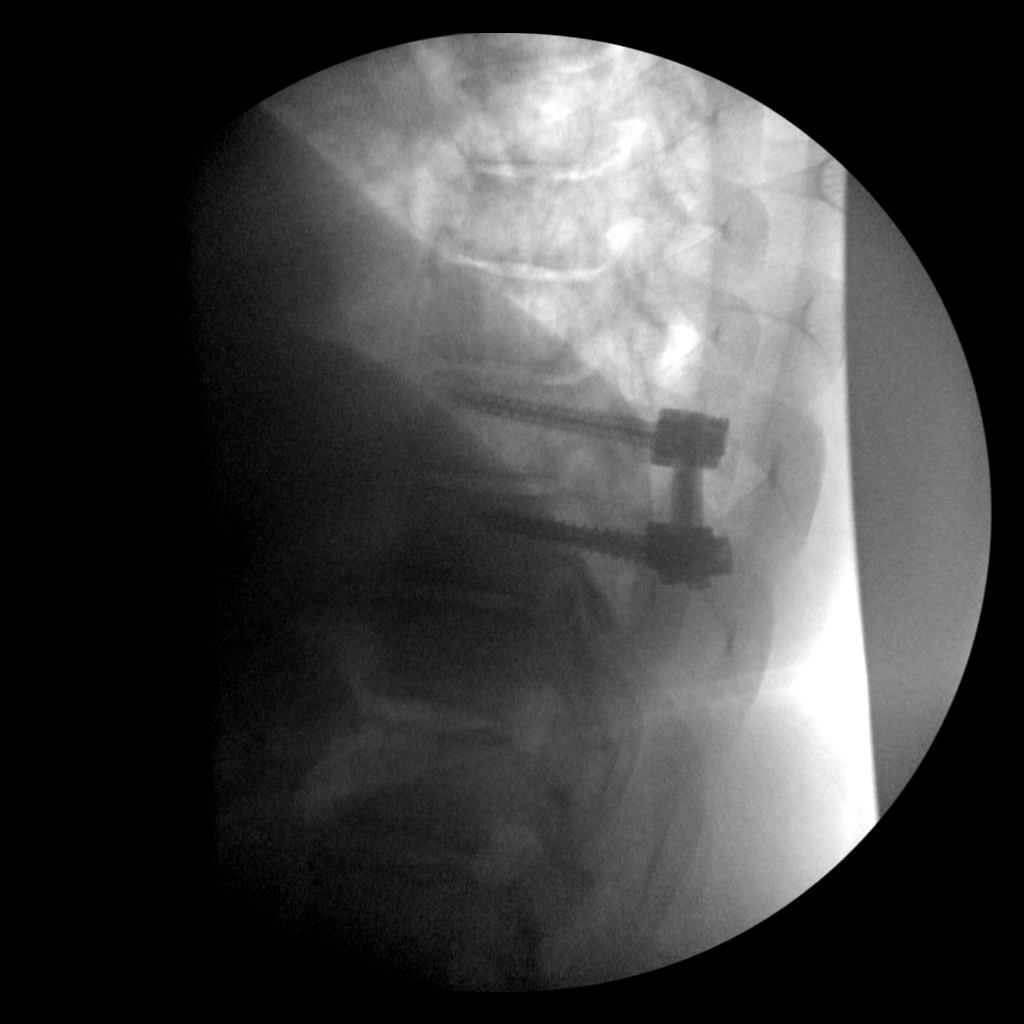

[2 of 2 positions shown; findings below may reference images not displayed]

FINDINGS: Two intraoperative fluoroscopic images of the lower thoracic spine
demonstrate the patient be status post surgical posterior fusion of
T10-11 with bilateral intrapedicular screw placement.
IMPRESSION: Fluoroscopic guidance provided during lower thoracic fusion.

## 2020-01-12 IMAGING — RF DG C-ARM 1-60 MIN
1 series · 2 of 2 positions shown · non-contrast
Comparison: [DATE].

CLINICAL DATA: T10-11 decompression.

EXAM:
THORACIC SPINE 2 VIEWS; DG C-ARM 1-60 MIN
Radiation exposure index: 33.27 mGy.

[Series 1: run · 2 of 2 slices shown]
[im 1/2]
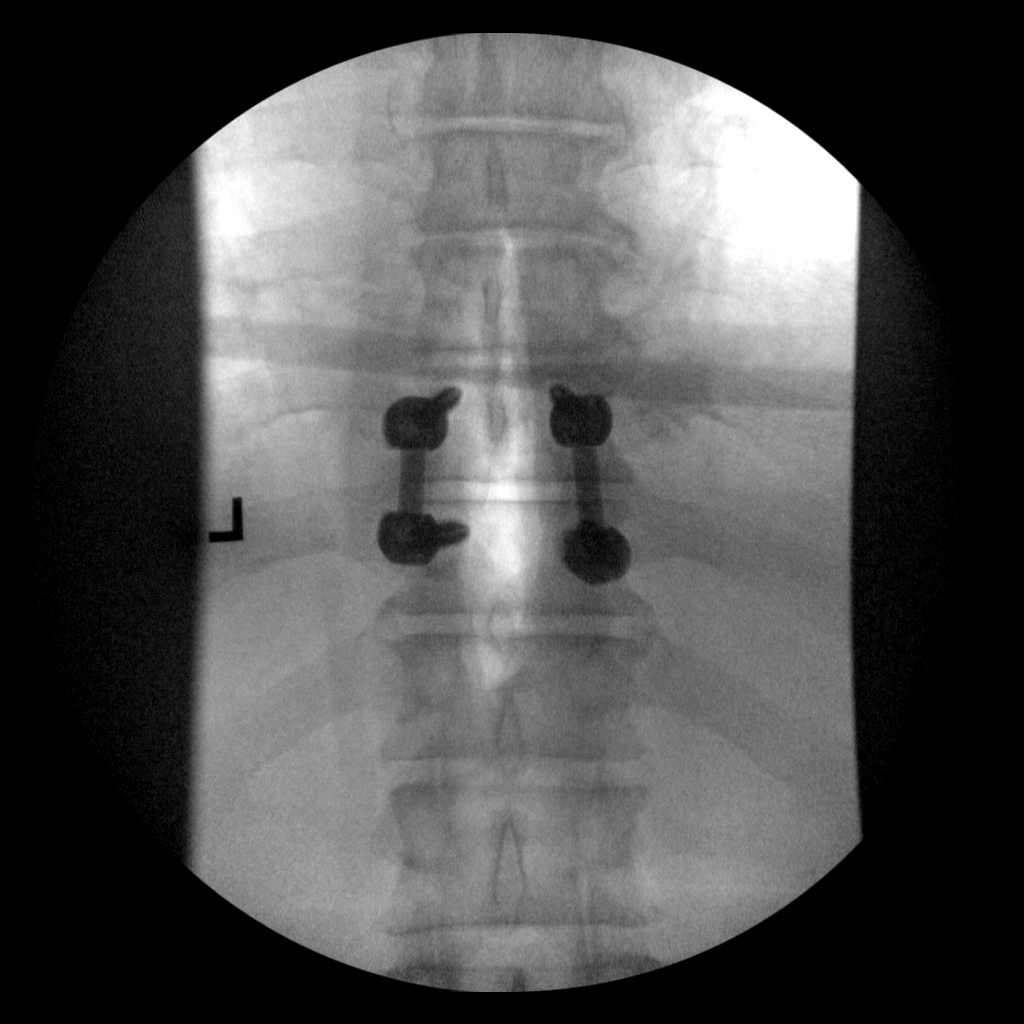
[im 2/2]
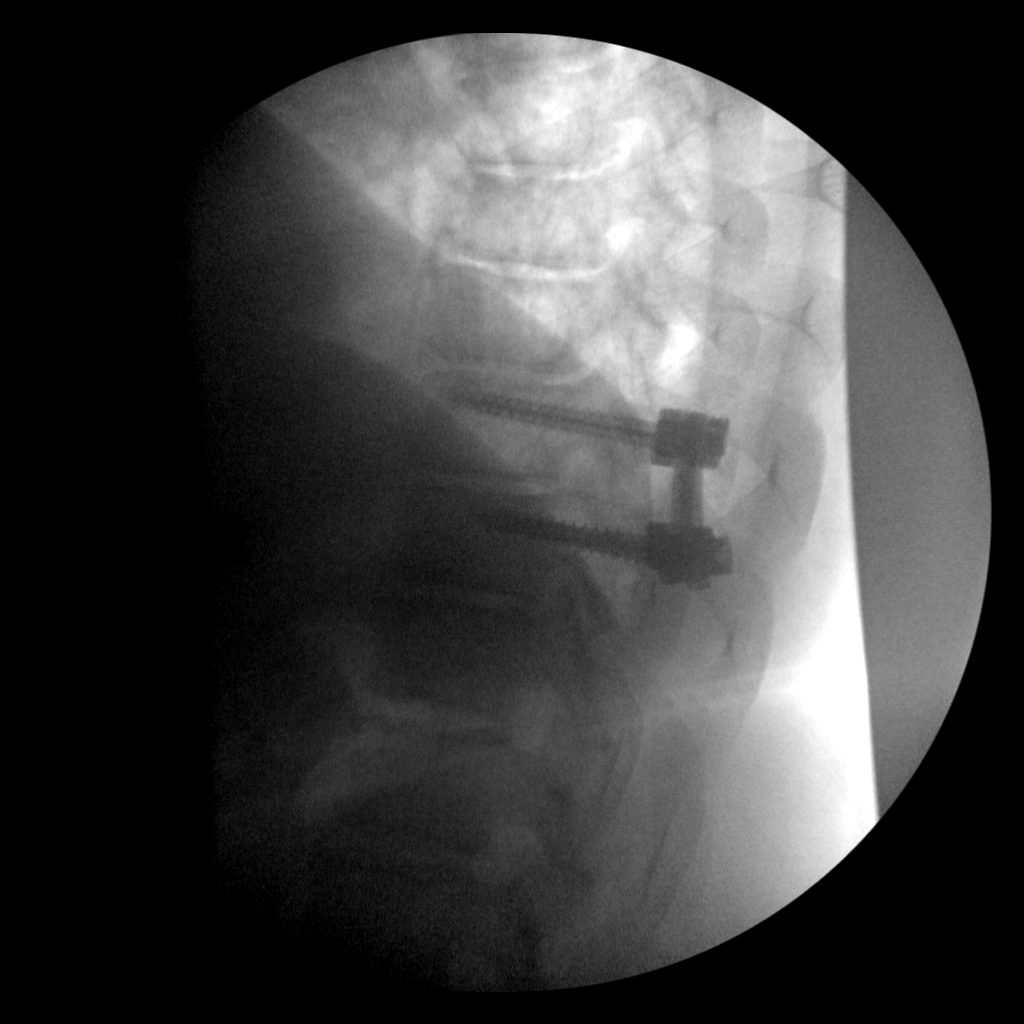

[2 of 2 positions shown; findings below may reference images not displayed]

FINDINGS: Two intraoperative fluoroscopic images of the lower thoracic spine
demonstrate the patient be status post surgical posterior fusion of
T10-11 with bilateral intrapedicular screw placement.
IMPRESSION: Fluoroscopic guidance provided during lower thoracic fusion.

## 2020-01-12 SURGERY — POSTERIOR LUMBAR FUSION 1 LEVEL
Anesthesia: General | Site: Back

## 2020-01-12 MED ORDER — LIDOCAINE 2% (20 MG/ML) 5 ML SYRINGE
INTRAMUSCULAR | Status: AC
  Filled 2020-01-12: qty 5

## 2020-01-12 MED ORDER — ACETAMINOPHEN 325 MG PO TABS
650.0000 mg | ORAL_TABLET | ORAL | Status: DC | PRN
  Administered 2020-01-13 (×2): 650 mg via ORAL
  Filled 2020-01-12 (×2): qty 2

## 2020-01-12 MED ORDER — MIDAZOLAM HCL 2 MG/2ML IJ SOLN
INTRAMUSCULAR | Status: AC
  Filled 2020-01-12: qty 2

## 2020-01-12 MED ORDER — FENTANYL CITRATE (PF) 100 MCG/2ML IJ SOLN
INTRAMUSCULAR | Status: AC
  Administered 2020-01-12: 50 ug via INTRAVENOUS
  Filled 2020-01-12: qty 2

## 2020-01-12 MED ORDER — CHLORHEXIDINE GLUCONATE CLOTH 2 % EX PADS: 6.0000 | MEDICATED_PAD | Freq: Once | CUTANEOUS | Status: DC

## 2020-01-12 MED ORDER — SUGAMMADEX SODIUM 500 MG/5ML IV SOLN
INTRAVENOUS | Status: DC | PRN
Start: 2020-01-12 — End: 2020-01-12
  Administered 2020-01-12: 310 mg via INTRAVENOUS

## 2020-01-12 MED ORDER — POLYETHYLENE GLYCOL 3350 17 G PO PACK: 17.0000 g | PACK | Freq: Every day | ORAL | Status: DC | PRN

## 2020-01-12 MED ORDER — AMISULPRIDE (ANTIEMETIC) 5 MG/2ML IV SOLN: 10.0000 mg | Freq: Once | INTRAVENOUS | Status: DC | PRN

## 2020-01-12 MED ORDER — DEXAMETHASONE SODIUM PHOSPHATE 10 MG/ML IJ SOLN
INTRAMUSCULAR | Status: AC
  Filled 2020-01-12: qty 1

## 2020-01-12 MED ORDER — FINASTERIDE 5 MG PO TABS
5.0000 mg | ORAL_TABLET | Freq: Every day | ORAL | Status: DC
  Administered 2020-01-12 – 2020-01-13 (×2): 5 mg via ORAL
  Filled 2020-01-12 (×2): qty 1

## 2020-01-12 MED ORDER — ONDANSETRON HCL 4 MG/2ML IJ SOLN
INTRAMUSCULAR | Status: AC
  Filled 2020-01-12: qty 2

## 2020-01-12 MED ORDER — FENTANYL CITRATE (PF) 100 MCG/2ML IJ SOLN
25.0000 ug | INTRAMUSCULAR | Status: DC | PRN
  Administered 2020-01-12: 50 ug via INTRAVENOUS
  Administered 2020-01-12 (×2): 25 ug via INTRAVENOUS

## 2020-01-12 MED ORDER — GABAPENTIN 600 MG PO TABS
600.0000 mg | ORAL_TABLET | Freq: Three times a day (TID) | ORAL | Status: DC
  Administered 2020-01-13: 600 mg via ORAL
  Filled 2020-01-12: qty 1

## 2020-01-12 MED ORDER — LIDOCAINE-EPINEPHRINE 1 %-1:100000 IJ SOLN
INTRAMUSCULAR | Status: AC
  Filled 2020-01-12: qty 1

## 2020-01-12 MED ORDER — METHOCARBAMOL 500 MG PO TABS
ORAL_TABLET | ORAL | Status: AC
  Filled 2020-01-12: qty 1

## 2020-01-12 MED ORDER — THROMBIN 20000 UNITS EX SOLR
CUTANEOUS | Status: AC
  Filled 2020-01-12: qty 20000

## 2020-01-12 MED ORDER — DOCUSATE SODIUM 100 MG PO CAPS
100.0000 mg | ORAL_CAPSULE | Freq: Two times a day (BID) | ORAL | Status: DC
  Administered 2020-01-13: 100 mg via ORAL
  Filled 2020-01-12: qty 1

## 2020-01-12 MED ORDER — SODIUM CHLORIDE 0.9 % IV SOLN
INTRAVENOUS | Status: DC | PRN
  Administered 2020-01-12: 500 mL

## 2020-01-12 MED ORDER — PROPOFOL 10 MG/ML IV BOLUS
INTRAVENOUS | Status: AC
  Filled 2020-01-12: qty 20

## 2020-01-12 MED ORDER — ROCURONIUM BROMIDE 10 MG/ML (PF) SYRINGE
PREFILLED_SYRINGE | INTRAVENOUS | Status: DC | PRN
  Administered 2020-01-12: 50 mg via INTRAVENOUS
  Administered 2020-01-12: 30 mg via INTRAVENOUS
  Administered 2020-01-12: 70 mg via INTRAVENOUS
  Administered 2020-01-12: 20 mg via INTRAVENOUS

## 2020-01-12 MED ORDER — METHOCARBAMOL 500 MG PO TABS
500.0000 mg | ORAL_TABLET | Freq: Four times a day (QID) | ORAL | Status: DC | PRN
  Administered 2020-01-12: 500 mg via ORAL

## 2020-01-12 MED ORDER — MIDAZOLAM HCL 5 MG/5ML IJ SOLN
INTRAMUSCULAR | Status: DC | PRN
  Administered 2020-01-12 (×2): 1 mg via INTRAVENOUS

## 2020-01-12 MED ORDER — DEXAMETHASONE SODIUM PHOSPHATE 10 MG/ML IJ SOLN
INTRAMUSCULAR | Status: DC | PRN
  Administered 2020-01-12: 10 mg via INTRAVENOUS

## 2020-01-12 MED ORDER — THROMBIN 5000 UNITS EX SOLR
CUTANEOUS | Status: AC
  Filled 2020-01-12: qty 5000

## 2020-01-12 MED ORDER — ORAL CARE MOUTH RINSE: 15.0000 mL | Freq: Once | OROMUCOSAL | Status: AC

## 2020-01-12 MED ORDER — METHOCARBAMOL 1000 MG/10ML IJ SOLN
500.0000 mg | Freq: Four times a day (QID) | INTRAVENOUS | Status: DC | PRN
  Filled 2020-01-12: qty 5

## 2020-01-12 MED ORDER — ONDANSETRON HCL 4 MG/2ML IJ SOLN
INTRAMUSCULAR | Status: DC | PRN
  Administered 2020-01-12: 4 mg via INTRAVENOUS

## 2020-01-12 MED ORDER — LIDOCAINE 2% (20 MG/ML) 5 ML SYRINGE
INTRAMUSCULAR | Status: DC | PRN
  Administered 2020-01-12: 100 mg via INTRAVENOUS

## 2020-01-12 MED ORDER — LISINOPRIL 20 MG PO TABS
20.0000 mg | ORAL_TABLET | Freq: Every day | ORAL | Status: DC
  Administered 2020-01-12 – 2020-01-13 (×2): 20 mg via ORAL
  Filled 2020-01-12 (×2): qty 1

## 2020-01-12 MED ORDER — MORPHINE SULFATE (PF) 2 MG/ML IV SOLN
2.0000 mg | INTRAVENOUS | Status: DC | PRN
  Administered 2020-01-12: 4 mg via INTRAVENOUS

## 2020-01-12 MED ORDER — ESMOLOL HCL 100 MG/10ML IV SOLN
INTRAVENOUS | Status: DC | PRN
Start: 2020-01-12 — End: 2020-01-12
  Administered 2020-01-12: 30 mg via INTRAVENOUS

## 2020-01-12 MED ORDER — CHLORHEXIDINE GLUCONATE 0.12 % MT SOLN
15.0000 mL | Freq: Once | OROMUCOSAL | Status: AC
  Administered 2020-01-12: 15 mL via OROMUCOSAL
  Filled 2020-01-12: qty 15

## 2020-01-12 MED ORDER — FLEET ENEMA 7-19 GM/118ML RE ENEM: 1.0000 | ENEMA | Freq: Once | RECTAL | Status: DC | PRN

## 2020-01-12 MED ORDER — THROMBIN 20000 UNITS EX SOLR
CUTANEOUS | Status: DC | PRN
  Administered 2020-01-12: 20 mL via TOPICAL

## 2020-01-12 MED ORDER — LACTATED RINGERS IV SOLN: INTRAVENOUS | Status: DC

## 2020-01-12 MED ORDER — DEXMEDETOMIDINE HCL 200 MCG/2ML IV SOLN
INTRAVENOUS | Status: DC | PRN
  Administered 2020-01-12: 4 ug via INTRAVENOUS
  Administered 2020-01-12: 8 ug via INTRAVENOUS

## 2020-01-12 MED ORDER — HYDROCODONE-ACETAMINOPHEN 5-325 MG PO TABS
ORAL_TABLET | ORAL | Status: AC
  Filled 2020-01-12: qty 2

## 2020-01-12 MED ORDER — LIDOCAINE-EPINEPHRINE 1 %-1:100000 IJ SOLN
INTRAMUSCULAR | Status: DC | PRN
  Administered 2020-01-12: 5 mL

## 2020-01-12 MED ORDER — FENTANYL CITRATE (PF) 250 MCG/5ML IJ SOLN
INTRAMUSCULAR | Status: DC | PRN
  Administered 2020-01-12 (×2): 50 ug via INTRAVENOUS
  Administered 2020-01-12: 150 ug via INTRAVENOUS

## 2020-01-12 MED ORDER — BISACODYL 10 MG RE SUPP: 10.0000 mg | Freq: Every day | RECTAL | Status: DC | PRN

## 2020-01-12 MED ORDER — LISINOPRIL-HYDROCHLOROTHIAZIDE 20-25 MG PO TABS: 1.0000 | ORAL_TABLET | Freq: Every day | ORAL | Status: DC

## 2020-01-12 MED ORDER — BUPIVACAINE HCL (PF) 0.5 % IJ SOLN
INTRAMUSCULAR | Status: DC | PRN
  Administered 2020-01-12: 5 mL

## 2020-01-12 MED ORDER — CEFAZOLIN SODIUM-DEXTROSE 2-4 GM/100ML-% IV SOLN
INTRAVENOUS | Status: AC
  Filled 2020-01-12: qty 100

## 2020-01-12 MED ORDER — HYDROMORPHONE HCL 1 MG/ML IJ SOLN
INTRAMUSCULAR | Status: DC | PRN
  Administered 2020-01-12: .5 mg via INTRAVENOUS

## 2020-01-12 MED ORDER — LABETALOL HCL 5 MG/ML IV SOLN
INTRAVENOUS | Status: DC | PRN
Start: 2020-01-12 — End: 2020-01-12
  Administered 2020-01-12 (×3): 5 mg via INTRAVENOUS

## 2020-01-12 MED ORDER — MORPHINE SULFATE (PF) 4 MG/ML IV SOLN
INTRAVENOUS | Status: AC
  Administered 2020-01-12: 4 mg
  Filled 2020-01-12: qty 1

## 2020-01-12 MED ORDER — MENTHOL 3 MG MT LOZG: 1.0000 | LOZENGE | OROMUCOSAL | Status: DC | PRN

## 2020-01-12 MED ORDER — 0.9 % SODIUM CHLORIDE (POUR BTL) OPTIME
TOPICAL | Status: DC | PRN
  Administered 2020-01-12: 1000 mL

## 2020-01-12 MED ORDER — THROMBIN 5000 UNITS EX SOLR
OROMUCOSAL | Status: DC | PRN
  Administered 2020-01-12 (×2): 5 mL via TOPICAL

## 2020-01-12 MED ORDER — ACETAMINOPHEN 650 MG RE SUPP: 650.0000 mg | RECTAL | Status: DC | PRN

## 2020-01-12 MED ORDER — HYDROCODONE-ACETAMINOPHEN 5-325 MG PO TABS
1.0000 | ORAL_TABLET | ORAL | Status: DC | PRN
  Administered 2020-01-12: 2 via ORAL

## 2020-01-12 MED ORDER — HYDROCHLOROTHIAZIDE 25 MG PO TABS
25.0000 mg | ORAL_TABLET | Freq: Every day | ORAL | Status: DC
  Administered 2020-01-12 – 2020-01-13 (×2): 25 mg via ORAL
  Filled 2020-01-12 (×2): qty 1

## 2020-01-12 MED ORDER — ONDANSETRON HCL 4 MG PO TABS: 4.0000 mg | ORAL_TABLET | Freq: Four times a day (QID) | ORAL | Status: DC | PRN

## 2020-01-12 MED ORDER — KETAMINE HCL 50 MG/5ML IJ SOSY
PREFILLED_SYRINGE | INTRAMUSCULAR | Status: AC
  Filled 2020-01-12: qty 5

## 2020-01-12 MED ORDER — BUPIVACAINE HCL (PF) 0.5 % IJ SOLN
INTRAMUSCULAR | Status: AC
  Filled 2020-01-12: qty 30

## 2020-01-12 MED ORDER — PROPOFOL 10 MG/ML IV BOLUS
INTRAVENOUS | Status: DC | PRN
  Administered 2020-01-12: 200 mg via INTRAVENOUS

## 2020-01-12 MED ORDER — KETAMINE HCL 10 MG/ML IJ SOLN
INTRAMUSCULAR | Status: DC | PRN
Start: 2020-01-12 — End: 2020-01-12
  Administered 2020-01-12: 30 mg via INTRAVENOUS
  Administered 2020-01-12 (×2): 10 mg via INTRAVENOUS

## 2020-01-12 MED ORDER — KETOROLAC TROMETHAMINE 15 MG/ML IJ SOLN
7.5000 mg | Freq: Four times a day (QID) | INTRAMUSCULAR | Status: DC
  Administered 2020-01-13 (×2): 7.5 mg via INTRAVENOUS
  Filled 2020-01-12 (×3): qty 1

## 2020-01-12 MED ORDER — ACETAMINOPHEN 500 MG PO TABS
1000.0000 mg | ORAL_TABLET | Freq: Once | ORAL | Status: AC
  Administered 2020-01-12: 500 mg via ORAL
  Filled 2020-01-12: qty 2

## 2020-01-12 MED ORDER — FENTANYL CITRATE (PF) 250 MCG/5ML IJ SOLN
INTRAMUSCULAR | Status: AC
  Filled 2020-01-12: qty 5

## 2020-01-12 MED ORDER — CEFAZOLIN SODIUM-DEXTROSE 2-4 GM/100ML-% IV SOLN
2.0000 g | Freq: Three times a day (TID) | INTRAVENOUS | Status: DC
  Administered 2020-01-12: 2 g via INTRAVENOUS

## 2020-01-12 MED ORDER — ONDANSETRON HCL 4 MG/2ML IJ SOLN: 4.0000 mg | Freq: Four times a day (QID) | INTRAMUSCULAR | Status: DC | PRN

## 2020-01-12 MED ORDER — LABETALOL HCL 5 MG/ML IV SOLN
INTRAVENOUS | Status: AC
  Administered 2020-01-12: 10 mg via INTRAVENOUS
  Filled 2020-01-12: qty 4

## 2020-01-12 MED ORDER — ROCURONIUM BROMIDE 10 MG/ML (PF) SYRINGE
PREFILLED_SYRINGE | INTRAVENOUS | Status: AC
  Filled 2020-01-12: qty 10

## 2020-01-12 MED ORDER — PHENOL 1.4 % MT LIQD: 1.0000 | OROMUCOSAL | Status: DC | PRN

## 2020-01-12 MED ORDER — LABETALOL HCL 5 MG/ML IV SOLN
10.0000 mg | INTRAVENOUS | Status: AC | PRN
  Administered 2020-01-12: 10 mg via INTRAVENOUS

## 2020-01-12 MED ORDER — ALUM & MAG HYDROXIDE-SIMETH 200-200-20 MG/5ML PO SUSP: 30.0000 mL | Freq: Four times a day (QID) | ORAL | Status: DC | PRN

## 2020-01-12 MED ORDER — METHOCARBAMOL 500 MG PO TABS
ORAL_TABLET | ORAL | Status: AC
  Administered 2020-01-12: 500 mg via ORAL
  Filled 2020-01-12: qty 1

## 2020-01-12 MED ORDER — KETOROLAC TROMETHAMINE 15 MG/ML IJ SOLN
INTRAMUSCULAR | Status: AC
  Administered 2020-01-12: 7.5 mg via INTRAVENOUS
  Filled 2020-01-12: qty 1

## 2020-01-12 MED ORDER — HYDROMORPHONE HCL 1 MG/ML IJ SOLN
INTRAMUSCULAR | Status: AC
  Filled 2020-01-12: qty 0.5

## 2020-01-12 MED ORDER — SENNA 8.6 MG PO TABS
1.0000 | ORAL_TABLET | Freq: Two times a day (BID) | ORAL | Status: DC
  Administered 2020-01-13: 8.6 mg via ORAL
  Filled 2020-01-12: qty 1

## 2020-01-12 SURGICAL SUPPLY — 71 items
BAG DECANTER FOR FLEXI CONT (MISCELLANEOUS) ×3 IMPLANT
BASKET BONE COLLECTION (BASKET) ×3 IMPLANT
BIT DRILL LONG 3.0X30 (BIT) IMPLANT
BIT DRILL LONG 3X80 (BIT) IMPLANT
BIT DRILL LONG 4X80 (BIT) IMPLANT
BIT DRILL SHORT 3.0X30 (BIT) IMPLANT
BIT DRILL SHORT 3X80 (BIT) IMPLANT
BLADE SURG 11 STRL SS (BLADE) ×3 IMPLANT
BUR MATCHSTICK NEURO 3.0 LAGG (BURR) ×3 IMPLANT
CANISTER SUCT 3000ML PPV (MISCELLANEOUS) ×3 IMPLANT
CNTNR URN SCR LID CUP LEK RST (MISCELLANEOUS) ×2 IMPLANT
CONT SPEC 4OZ STRL OR WHT (MISCELLANEOUS) ×3
COVER BACK TABLE 60X90IN (DRAPES) ×3 IMPLANT
DERMABOND ADVANCED (GAUZE/BANDAGES/DRESSINGS) ×1
DERMABOND ADVANCED .7 DNX12 (GAUZE/BANDAGES/DRESSINGS) ×2 IMPLANT
DEVICE DISSECT PLASMABLAD 3.0S (MISCELLANEOUS) ×2 IMPLANT
DRAPE C-ARM 42X72 X-RAY (DRAPES) ×3 IMPLANT
DRAPE HALF SHEET 40X57 (DRAPES) IMPLANT
DRAPE LAPAROTOMY 100X72X124 (DRAPES) ×3 IMPLANT
DRAPE SHEET LG 3/4 BI-LAMINATE (DRAPES) ×3 IMPLANT
DRSG OPSITE POSTOP 4X8 (GAUZE/BANDAGES/DRESSINGS) ×3 IMPLANT
DURAPREP 26ML APPLICATOR (WOUND CARE) ×3 IMPLANT
DURASEAL APPLICATOR TIP (TIP) IMPLANT
DURASEAL SPINE SEALANT 3ML (MISCELLANEOUS) IMPLANT
ELECT BLADE 4.0 EZ CLEAN MEGAD (MISCELLANEOUS)
ELECT REM PT RETURN 9FT ADLT (ELECTROSURGICAL) ×3
ELECTRODE BLDE 4.0 EZ CLN MEGD (MISCELLANEOUS) IMPLANT
ELECTRODE REM PT RTRN 9FT ADLT (ELECTROSURGICAL) ×2 IMPLANT
GAUZE 4X4 16PLY RFD (DISPOSABLE) IMPLANT
GAUZE SPONGE 4X4 12PLY STRL (GAUZE/BANDAGES/DRESSINGS) ×3 IMPLANT
GLOVE BIOGEL PI IND STRL 8.5 (GLOVE) ×4 IMPLANT
GLOVE BIOGEL PI INDICATOR 8.5 (GLOVE) ×2
GLOVE ECLIPSE 8.5 STRL (GLOVE) ×6 IMPLANT
GOWN STRL REUS W/ TWL LRG LVL3 (GOWN DISPOSABLE) IMPLANT
GOWN STRL REUS W/ TWL XL LVL3 (GOWN DISPOSABLE) ×4 IMPLANT
GOWN STRL REUS W/TWL 2XL LVL3 (GOWN DISPOSABLE) ×6 IMPLANT
GOWN STRL REUS W/TWL LRG LVL3 (GOWN DISPOSABLE)
GOWN STRL REUS W/TWL XL LVL3 (GOWN DISPOSABLE) ×6
HEMOSTAT POWDER KIT SURGIFOAM (HEMOSTASIS) ×6 IMPLANT
KIT BASIN OR (CUSTOM PROCEDURE TRAY) ×3 IMPLANT
KIT SPINE MAZOR X ROBO DISP (MISCELLANEOUS) ×3 IMPLANT
KIT TURNOVER KIT B (KITS) ×3 IMPLANT
MILL MEDIUM DISP (BLADE) ×3 IMPLANT
NEEDLE HYPO 22GX1.5 SAFETY (NEEDLE) ×3 IMPLANT
NEEDLE I-PASS III (NEEDLE) ×3 IMPLANT
NS IRRIG 1000ML POUR BTL (IV SOLUTION) ×3 IMPLANT
PACK LAMINECTOMY NEURO (CUSTOM PROCEDURE TRAY) ×3 IMPLANT
PATTIES SURGICAL .5 X1 (DISPOSABLE) ×3 IMPLANT
PIN HEAD 2.5X60MM (PIN) IMPLANT
PLASMABLADE 3.0S (MISCELLANEOUS) ×3
ROD RELINE-O LORD 5.5X35MM (Rod) ×3 IMPLANT
ROD RELINE-O LORD 5.5X40 (Rod) ×3 IMPLANT
SCREW LOCK RELINE 5.5 TULIP (Screw) ×12 IMPLANT
SCREW MAS RELINE 6.5X45 POLY (Screw) ×6 IMPLANT
SCREW PA RELINE MAS 5.5X50 C2 (Screw) ×6 IMPLANT
SCREW SCHANZ SA 4.0MM (MISCELLANEOUS) IMPLANT
SPONGE LAP 4X18 RFD (DISPOSABLE) IMPLANT
SPONGE SURGIFOAM ABS GEL 100 (HEMOSTASIS) ×3 IMPLANT
STRIP BIOACTIVE VITOSS 25X52X4 (Orthopedic Implant) ×3 IMPLANT
SUT PROLENE 6 0 BV (SUTURE) IMPLANT
SUT VIC AB 1 CT1 18XBRD ANBCTR (SUTURE) ×2 IMPLANT
SUT VIC AB 1 CT1 8-18 (SUTURE) ×3
SUT VIC AB 2-0 CP2 18 (SUTURE) ×3 IMPLANT
SUT VIC AB 3-0 SH 8-18 (SUTURE) ×3 IMPLANT
SUT VIC AB 4-0 RB1 18 (SUTURE) ×6 IMPLANT
SYR 3ML LL SCALE MARK (SYRINGE) ×12 IMPLANT
TOWEL GREEN STERILE (TOWEL DISPOSABLE) ×3 IMPLANT
TOWEL GREEN STERILE FF (TOWEL DISPOSABLE) ×3 IMPLANT
TRAY FOLEY MTR SLVR 16FR STAT (SET/KITS/TRAYS/PACK) ×3 IMPLANT
TUBE MAZOR SA REDUCTION (TUBING) IMPLANT
WATER STERILE IRR 1000ML POUR (IV SOLUTION) ×3 IMPLANT

## 2020-01-12 NOTE — Anesthesia Postprocedure Evaluation (Signed)
Anesthesia Post Note  Patient: Aaron Castro  Procedure(s) Performed: Thoracic Ten-Eleven decompression,posterior lateral arthrodesis, pedicle screw fixation,attempted MAZOR (N/A Back) APPLICATION OF ROBOTIC ASSISTANCE FOR SPINAL PROCEDURE (N/A )     Patient location during evaluation: PACU Anesthesia Type: General Level of consciousness: awake and alert Pain management: pain level controlled Vital Signs Assessment: post-procedure vital signs reviewed and stable Respiratory status: spontaneous breathing, nonlabored ventilation, respiratory function stable and patient connected to nasal cannula oxygen Cardiovascular status: blood pressure returned to baseline and stable Postop Assessment: no apparent nausea or vomiting Anesthetic complications: no   No complications documented.  Last Vitals:  Vitals:   01/12/20 1637 01/12/20 1707  BP: (!) 143/74 (!) 177/61  Pulse: 94 68  Resp: 19 11  Temp:    SpO2: 99% 99%    Last Pain:  Vitals:   01/12/20 1707  TempSrc:   PainSc: 6                  Kennieth Rad

## 2020-01-12 NOTE — Transfer of Care (Signed)
Immediate Anesthesia Transfer of Care Note  Patient: Aaron Castro  Procedure(s) Performed: Thoracic Ten-Eleven decompression,posterior lateral arthrodesis, pedicle screw fixation,attempted MAZOR (N/A Back) APPLICATION OF ROBOTIC ASSISTANCE FOR SPINAL PROCEDURE (N/A )  Patient Location: PACU  Anesthesia Type:General  Level of Consciousness: awake  Airway & Oxygen Therapy: Patient Spontanous Breathing  Post-op Assessment: Report given to RN and Post -op Vital signs reviewed and stable  Post vital signs: Reviewed and stable  Last Vitals:  Vitals Value Taken Time  BP 142/96 01/12/20 1151  Temp    Pulse 79 01/12/20 1153  Resp 18 01/12/20 1153  SpO2 97 % 01/12/20 1153  Vitals shown include unvalidated device data.  Last Pain:  Vitals:   01/12/20 0637  TempSrc:   PainSc: 3          Complications: No complications documented.

## 2020-01-12 NOTE — Anesthesia Procedure Notes (Signed)
Procedure Name: Intubation Performed by: Ezekiel Ina, CRNA Pre-anesthesia Checklist: Patient identified, Emergency Drugs available, Suction available and Patient being monitored Patient Re-evaluated:Patient Re-evaluated prior to induction Oxygen Delivery Method: Circle System Utilized Preoxygenation: Pre-oxygenation with 100% oxygen Induction Type: IV induction Ventilation: Mask ventilation without difficulty and Oral airway inserted - appropriate to patient size Laryngoscope Size: Glidescope and 4 Grade View: Grade I Tube type: Oral Tube size: 7.5 mm Number of attempts: 1 Airway Equipment and Method: Oral airway,  Video-laryngoscopy and Stylet Placement Confirmation: ETT inserted through vocal cords under direct vision,  positive ETCO2 and breath sounds checked- equal and bilateral Secured at: 23 cm Tube secured with: Tape Dental Injury: Teeth and Oropharynx as per pre-operative assessment  Difficulty Due To: Difficulty was anticipated, Difficult Airway- due to large tongue and Difficult Airway- due to reduced neck mobility

## 2020-01-12 NOTE — Progress Notes (Signed)
Main pharmacy called to see if I can get the patients PO medications restarted.  Also attempted to get the patient to use a urinal.  Pt says he doesn't feel like he needs to urinate right now and he takes medication to help him pee.  Will continue to monitor.

## 2020-01-12 NOTE — Op Note (Signed)
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  The patient has multilevel spondylosis and previously had undergone emergent decompression of L3-L4 for weakness in his legs.  He also has had cervical myelopathy and underwent a cervical decompression about a year ago.  He regained function but then gradually starts to lose function in his lower extremities and this time an MRI of the thoracic spine demonstrated a high-grade stenosis at T10-T11.  He was advised regarding the need for surgery.  Procedure: Patient was brought to the operating room supine on the stretcher.  After the smooth induction of general endotracheal anesthesia and placement of a Foley catheter he was placed prone onto the [place] table with the bony prominences being appropriately padded and protected.  The back was then prepped with alcohol DuraPrep and draped in a sterile fashion.  Midline incision was created after marking the sites of T9-T12.  The dissection was carried down to the thoracodorsal fascia.  During this time we had planned on using the major spinal robot however on initializing the robotic arm it had a fatal computer error and was not going to be available.  We then proceeded with dissecting the T10-T11 vertebrae out to the pedicle entry sites at each of these areas then using fluoroscopic guidance and a Jamshidi needle we placed Jamshidi needles into T11 first and then T10 over the appropriately place Jamshidi needle we tapped 5.5 holes and placed 5.5 x 50 mm screws  and T10 and 6.5 x 45 mm screws and T11.  Fluoroscopic guidance was used along the way to make sure that there was good positioning and no evidence of medial cut out which was also checked with a ball probe.  Once the screws were placed he then proceeded to create a laminectomy removing the inferior margin lamina of T10 and out to and including a partial facetectomy.  Thickened redundant yellow ligament with calcified portions within the ligament were removed and this was creating a substantial amount of the dorsal component of the stenosis.  Feeling laterally we could feel that there was some ventral component but this was not accessible and because the wide dorsal decompression was performed we did not pursue doing any ventral decompression.  Also because a partial facetectomy had only been created we decided to then decorticate the area around the facet between T10 and T11 and use the autograft that we harvested from the laminectomy mixed with a 5 cc strip of V toss soaked in blood into the lateral gutters to create the arthrodesis once both sides were packed we then positioned a 35 mm rod on the left side and a 40 mm rod on the right side and tightened the construct in a neutral fashion.  Care was taken to make sure that an adequate decompression had been performed at T10 and T11 and once this was verified hemostasis in the soft tissues was checked doubly and then the retractors were removed.  The thoracodorsal fascia was closed with #1 Vicryl in interrupted fashion 2-0 Vicryl was used in the subcutaneous tissues and 3-0 Vicryl subcuticularly with a final superficial closure layer of 4-0 Vicryl.  Dermabond was placed on the skin.  Blood loss for the procedure was estimated 300 cc.  The patient tolerated procedure well was turned back to the supine position onto the patient's own bed.  He was returned to recovery room in stable condition after being extubated.

## 2020-01-12 NOTE — Progress Notes (Signed)
Notified MDA Aaron Castro that the patient has had elevated blood pressures, but has been restarted on home PO medications and this RN has been addressing pain issues with PO and IV medications.  Will continue to monitor and notify for further changes.  MDA OK with systolic BP less thank 200.
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
## 2020-01-12 NOTE — Progress Notes (Signed)
Took over patient care at 2120.  Report received from [place], [place].

## 2020-01-13 ENCOUNTER — Encounter (HOSPITAL_COMMUNITY): Payer: Self-pay | Admitting: Neurological Surgery

## 2020-01-13 LAB — CBC
HCT: 42.9 % (ref 39.0–52.0)
Hemoglobin: 13.7 g/dL (ref 13.0–17.0)
MCH: 28.1 pg (ref 26.0–34.0)
MCHC: 31.9 g/dL (ref 30.0–36.0)
MCV: 87.9 fL (ref 80.0–100.0)
Platelets: 226 10*3/uL (ref 150–400)
RBC: 4.88 MIL/uL (ref 4.22–5.81)
RDW: 14.3 % (ref 11.5–15.5)
WBC: 18.7 10*3/uL — ABNORMAL HIGH (ref 4.0–10.5)
nRBC: 0 % (ref 0.0–0.2)

## 2020-01-13 LAB — BASIC METABOLIC PANEL
Anion gap: 9 (ref 5–15)
BUN: 15 mg/dL (ref 8–23)
CO2: 28 mmol/L (ref 22–32)
Calcium: 9.2 mg/dL (ref 8.9–10.3)
Chloride: 101 mmol/L (ref 98–111)
Creatinine, Ser: 0.95 mg/dL (ref 0.61–1.24)
GFR calc Af Amer: >60 mL/min (ref >60)
GFR calc non Af Amer: >60 mL/min (ref >60)
Glucose, Bld: 147 mg/dL — ABNORMAL HIGH (ref 70–99)
Potassium: 3.9 mmol/L (ref 3.5–5.1)
Sodium: 138 mmol/L (ref 135–145)

## 2020-01-13 MED ORDER — CEFAZOLIN SODIUM-DEXTROSE 2-4 GM/100ML-% IV SOLN
2.0000 g | Freq: Once | INTRAVENOUS | Status: AC
  Administered 2020-01-13: 2 g via INTRAVENOUS
  Filled 2020-01-13: qty 100

## 2020-01-13 MED ORDER — SODIUM CHLORIDE 0.9% FLUSH: 3.0000 mL | Freq: Two times a day (BID) | INTRAVENOUS | Status: DC

## 2020-01-13 MED ORDER — SODIUM CHLORIDE 0.9 % IV SOLN: 250.0000 mL | INTRAVENOUS | Status: DC

## 2020-01-13 MED ORDER — SODIUM CHLORIDE 0.9% FLUSH: 3.0000 mL | INTRAVENOUS | Status: DC | PRN

## 2020-01-13 MED ORDER — HYDROCODONE-ACETAMINOPHEN 5-325 MG PO TABS: 1.0000 | ORAL_TABLET | ORAL | 0 refills | Status: AC | PRN

## 2020-01-13 MED FILL — Thrombin For Soln 5000 Unit: CUTANEOUS | Qty: 5000 | Status: AC

## 2020-01-13 NOTE — Progress Notes (Signed)
Patient ID: Aaron Castro, male   DOB: [birth year], 69 y.o.   MRN: [id number] Vital signs are stable Patient is eager to go home He feels his legs are functioning better Incision is clean and dry He is ambulated and voided Physical therapy suggest home health eval We will order this We will discharged home on hydrocodone

## 2020-01-13 NOTE — Evaluation (Signed)
Occupational Therapy Evaluation Patient Details Name: Aaron Castro MRN: [id number] DOB: [birth year] Today's Date: 01/13/2020    History of Present Illness Pt is a 69 y.o. male with PMH of HTN, hx of TBI, cervical myelopathy, arthritis, morbid obesity, and diverticulitis with history of spinal cord compression in cervical and lumbar spine. Presenting for procedure with progressive weakness of the lower extremities. s/p T10-11 decompression, posterior lateral arthrodesis, and pedicle screw fixation.   Clinical Impression   PTA pt reports being independent with ADLs with use of DME for mobility and driving. Pt was admitted for above and treated for problem list below (see OT Problem List). Requires supervision - min guard with multimodal cues for safety and maintaining back precautions with ADLs. Requires min guard with transfers and ambulation with multimodal cues for safety with walker and maintaining back precautions. Pt reported that he had some numbness and tingling in L foot and toes and said it resolved when taking a walk with therapist during the session. Believe pt would benefit from skilled OT services acutely and at the [facility] level to increase safety with ADLs and return to PLOF.    Follow Up Recommendations  Home health OT;Supervision/Assistance - 24 hour    Equipment Recommendations  None recommended by OT       Precautions / Restrictions Precautions Precautions: Fall;Back Precaution Booklet Issued: No Precaution Comments: No brace needed. Verbally reviewed back precautions with pt Restrictions Weight Bearing Restrictions: No      Mobility Bed Mobility               General bed mobility comments: Pt seated in recliner upon arrival and returned to recliner  Transfers Overall transfer level: Needs assistance Equipment used: Rolling walker (2 wheeled) Transfers: Sit to/from Stand Sit to Stand: Min guard         General transfer comment: Min guard for steady in  standing    Balance Overall balance assessment: Needs assistance Sitting-balance support: No upper extremity supported;Feet supported Sitting balance-Leahy Scale: Good Sitting balance - Comments: supervision   Standing balance support: Bilateral upper extremity supported Standing balance-Leahy Scale: Poor Standing balance comment: Reliant on BUE support in standing                           ADL either performed or assessed with clinical judgement   ADL Overall ADL's : Needs assistance/impaired     Grooming: Min guard;Cueing for safety;Standing;Wash/dry hands Grooming Details (indicate cue type and reason): Min guard for balance and cues for sequencing and maintaining back precautions Upper Body Bathing: Supervision/ safety;Cueing for safety;Sitting Upper Body Bathing Details (indicate cue type and reason): Supervision and cues to maintain back precautions Lower Body Bathing: Supervison/ safety;Adhering to back precautions;Sitting/lateral leans;Sit to/from stand Lower Body Bathing Details (indicate cue type and reason): Supervision and cues to maintain back precautions Upper Body Dressing : Supervision/safety;Cueing for safety;Sitting Upper Body Dressing Details (indicate cue type and reason): Supervision and cues to maintain back precautions Lower Body Dressing: Adhering to back precautions;Sitting/lateral leans;Sit to/from stand;Min guard Lower Body Dressing Details (indicate cue type and reason): Supervision and cues to maintain back precautions Toilet Transfer: Solicitor;Ambulation;RW Toilet Transfer Details (indicate cue type and reason): Min guard for balance Toileting- Clothing Manipulation and Hygiene: Sitting/lateral lean;Sit to/from stand;Min guard Toileting - Clothing Manipulation Details (indicate cue type and reason): Min guard for balance and cues to maintain back precautions     Functional mobility during ADLs: Min guard;Rolling  walker;Cueing  for safety General ADL Comments: Supervision - min guard with ADL due to weakness, pain, and decreased balance                  Pertinent Vitals/Pain Pain Assessment: Faces Pain Score: 5  Faces Pain Scale: Hurts even more Pain Location: back Pain Descriptors / Indicators: Grimacing;Guarding;Operative site guarding;Discomfort Pain Intervention(s): Monitored during session     Hand Dominance Right   Extremity/Trunk Assessment Upper Extremity Assessment Upper Extremity Assessment: Overall WFL for tasks assessed   Lower Extremity Assessment Lower Extremity Assessment: LLE deficits/detail LLE Deficits / Details: pt reported toe tingling and numbness but when walking said it has resolved   Cervical / Trunk Assessment Cervical / Trunk Assessment: Other exceptions (s/p back surgery) Cervical / Trunk Exceptions: s/p back surgery   Communication Communication Communication: No difficulties   Cognition Arousal/Alertness: Awake/alert Behavior During Therapy: WFL for tasks assessed/performed Overall Cognitive Status: Impaired/Different from baseline Area of Impairment: Safety/judgement;Problem solving                         Safety/Judgement: Decreased awareness of safety   Problem Solving: Difficulty sequencing;Requires verbal cues;Requires tactile cues General Comments: Pt requiring multimodal cues for sequencing and maintaining back precautions during functional activities              Home Living Family/patient expects to be discharged to:: Private residence Living Arrangements: Alone Available Help at Discharge: Family;Available 24 hours/day;Friend(s);Neighbor Type of Home: Other(Comment) (townhome) Home Access: Level entry     Home Layout: One level     Bathroom Shower/Tub: Chief Strategy Officer: Standard     Home Equipment: Environmental consultant - 2 wheels;Cane - single point;Grab bars - toilet;Grab bars - tub/shower;Shower seat;Bedside commode           Prior Functioning/Environment Level of Independence: Needs assistance  Gait / Transfers Assistance Needed: Pt reports PRN use of RW and cane with pain and reported history of falls ADL's / Homemaking Assistance Needed: independent Communication / Swallowing Assistance Needed: independent Comments: Pt reports he drives but does not go out into the community by himself        OT Problem List: Decreased activity tolerance;Impaired balance (sitting and/or standing);Decreased coordination;Decreased safety awareness;Decreased knowledge of precautions;Obesity;Pain;Decreased knowledge of use of DME or AE      OT Treatment/Interventions: Self-care/ADL training;Therapeutic exercise;Energy conservation;DME and/or AE instruction;Therapeutic activities;Patient/family education;Balance training    OT Goals(Current goals can be found in the care plan section) Acute Rehab OT Goals Patient Stated Goal: To go home OT Goal Formulation: With patient Time For Goal Achievement: 01/27/20 Potential to Achieve Goals: Good  OT Frequency: Min 2X/week    AM-PAC OT "6 Clicks" Daily Activity     Outcome Measure Help from another person eating meals?: None Help from another person taking care of personal grooming?: A Little Help from another person toileting, which includes using toliet, bedpan, or urinal?: A Little Help from another person bathing (including washing, rinsing, drying)?: A Little Help from another person to put on and taking off regular upper body clothing?: A Little Help from another person to put on and taking off regular lower body clothing?: A Little 6 Click Score: 19   End of Session Equipment Utilized During Treatment: Gait belt;Rolling walker Nurse Communication: Mobility status  Activity Tolerance: Patient tolerated treatment well Patient left: in chair;with call bell/phone within reach  OT Visit Diagnosis: Unsteadiness on feet (R26.81);Muscle weakness (generalized)  (M62.81);History of  falling (Z91.81);Other symptoms and signs involving the nervous system (R29.898);Pain Pain - part of body:  (back (incisional) and L LE )                Time: 7544-9201 OT Time Calculation (min): 17 min Charges:  OT General Charges $OT Visit: 1 Visit OT Evaluation $OT Eval Moderate Complexity: 1 Mod  Kammie Scioli/OTS  Kaidyn Hernandes 01/13/2020, 12:03 PM

## 2020-01-13 NOTE — Evaluation (Signed)
Physical Therapy Evaluation Patient Details Name: Aaron Castro MRN: [id number] DOB: Nov 27, 1950 Today's Date: 01/13/2020   History of Present Illness  Pt is a 69 y.o. male with PMH of HTN, hx of TBI, cervical myelopathy, arthritis, morbid obesity, and diverticulitis with history of spinal cord compression in cervical and lumbar spine. Presenting for procedure with progressive weakness of the lower extremities. s/p T10-11 decompression, posterior lateral arthrodesis, and pedicle screw fixation.  Clinical Impression  Pt presents to PT with deficits in functional mobility, gait, balance, endurance, strength, power, sensation, and with back pain. Pt performs transfers and ambulates for household distances without use of RW this session. Pt complains of back pain and continued L great toe numbness and tingling (has this at baseline). PT and pt verbally review back precautions and log roll technique. PT expresses no concerns for mobility at home at this time. PT recommending home health PT and intermittent assistance from family, as well as use of the pt's RW for all OOB mobility.    Follow Up Recommendations Home health PT;Supervision - Intermittent    Equipment Recommendations  None recommended by PT    Recommendations for Other Services       Precautions / Restrictions Precautions Precautions: Fall;Back Precaution Booklet Issued: No Precaution Comments: no brace needs. PT verbally reviews back precautions, pt able to recall no bending from prior surgery, PT reinforcing no lifting or twisting Restrictions Weight Bearing Restrictions: No      Mobility  Bed Mobility               General bed mobility comments: pt received in recliner and left in recliner. PT and pt verbally review log roll technique and sidelying to sit transition. Pt reports this is his usual technique at home  Transfers Overall transfer level: Needs assistance Equipment used: Rolling walker (2  wheeled) Transfers: Sit to/from Stand Sit to Stand: Supervision            Ambulation/Gait Ambulation/Gait assistance: Supervision Gait Distance (Feet): 100 Feet Assistive device: Rolling walker (2 wheeled) Gait Pattern/deviations: Step-through pattern;Wide base of support Gait velocity: reduced Gait velocity interpretation: <1.8 ft/sec, indicate of risk for recurrent falls General Gait Details: pt with steady step through gait, slight increase in trunk flexion near end of ambulation  Stairs            Wheelchair Mobility    Modified Rankin (Stroke Patients Only)       Balance Overall balance assessment: Needs assistance Sitting-balance support: No upper extremity supported;Feet supported Sitting balance-Leahy Scale: Good Sitting balance - Comments: supervision   Standing balance support: Bilateral upper extremity supported Standing balance-Leahy Scale: Poor Standing balance comment: reliant on BUE support                             Pertinent Vitals/Pain Pain Assessment: 0-10 Pain Score: 5  Pain Location: back Pain Descriptors / Indicators: Grimacing Pain Intervention(s): Monitored during session    Home Living Family/patient expects to be discharged to:: Private residence Living Arrangements: Alone Available Help at Discharge: Family;Available 24 hours/day Type of Home: House Home Access: Level entry     Home Layout: One level Home Equipment: Walker - 2 wheels;Cane - single point;Grab bars - toilet;Grab bars - tub/shower;Shower seat;Bedside commode      Prior Function Level of Independence: Needs assistance   Gait / Transfers Assistance Needed: pt mobilizes independently with PRN use of cane or RW when in pain, does  report a history of falls     Comments: pt reports he does drive but does not leave the house by himself     Hand Dominance   Dominant Hand: Right    Extremity/Trunk Assessment   Upper Extremity Assessment Upper  Extremity Assessment: Overall WFL for tasks assessed    Lower Extremity Assessment Lower Extremity Assessment: LLE deficits/detail LLE Deficits / Details: L great toe numbness and tingling    Cervical / Trunk Assessment Cervical / Trunk Assessment: Normal  Communication   Communication: No difficulties  Cognition Arousal/Alertness: Awake/alert Behavior During Therapy: WFL for tasks assessed/performed Overall Cognitive Status: Within Functional Limits for tasks assessed                                        General Comments General comments (skin integrity, edema, etc.): VSS on RA    Exercises     Assessment/Plan    PT Assessment Patient needs continued PT services  PT Problem List Decreased strength;Decreased activity tolerance;Decreased balance;Decreased mobility;Decreased knowledge of precautions;Pain       PT Treatment Interventions DME instruction;Gait training;Therapeutic activities;Functional mobility training;Therapeutic exercise;Balance training;Neuromuscular re-education;Patient/family education    PT Goals (Current goals can be found in the Care Plan section)  Acute Rehab PT Goals Patient Stated Goal: To go home PT Goal Formulation: With patient Time For Goal Achievement: 01/27/20 Potential to Achieve Goals: Good    Frequency Min 5X/week   Barriers to discharge        Co-evaluation               AM-PAC PT "6 Clicks" Mobility  Outcome Measure Help needed turning from your back to your side while in a flat bed without using bedrails?: A Little Help needed moving from lying on your back to sitting on the side of a flat bed without using bedrails?: A Little Help needed moving to and from a bed to a chair (including a wheelchair)?: None Help needed standing up from a chair using your arms (e.g., wheelchair or bedside chair)?: None Help needed to walk in hospital room?: None Help needed climbing 3-5 steps with a railing? : A Little 6  Click Score: 21    End of Session   Activity Tolerance: Patient tolerated treatment well Patient left: in chair;with call bell/phone within reach Nurse Communication: Mobility status PT Visit Diagnosis: Other abnormalities of gait and mobility (R26.89);History of falling (Z91.81);Pain Pain - part of body:  (back)    Time: 1017-5102 PT Time Calculation (min) (ACUTE ONLY): 10 min   Charges:   PT Evaluation $PT Eval Moderate Complexity: 1 Mod          Arlyss Gandy, PT, DPT Acute Rehabilitation Pager: [phone]   Arlyss Gandy 01/13/2020, 9:02 AM

## 2020-01-13 NOTE — Progress Notes (Signed)
Patient is doing well and will be discharged today with home health.

## 2020-01-13 NOTE — Discharge Summary (Addendum)
Physician Discharge Summary  Patient ID: Aaron Castro MRN: [id number] DOB/AGE: 1951-04-10 69 y.o.  Admit date: 01/12/2020 Discharge date: 01/13/2020  Admission Diagnoses: Thoracic stenosis with myelopathy, morbid obesity  Discharge Diagnoses: Thoracic stenosis with myelopathy, morbid obesity Active Problems:   Myelopathy concurrent with and due to spinal stenosis of thoracic region [facility])   Discharged Condition: good  Hospital Course: Patient was admitted to undergo surgical decompression and stabilization at T10-T11 which he tolerated well.  Consults: None  Significant Diagnostic Studies: None  Treatments: surgery: Thoracic laminectomy T10-T11 decompression and stabilization with pedicle screw fixation posterior lateral arthrodesis using local autograft and allograft.  Discharge Exam: Blood pressure (!) 145/77, pulse 75, temperature 99.6 F (37.6 C), temperature source Oral, resp. rate 18, height 5' 11.5" (1.816 m), weight (!) 154.2 kg, SpO2 99 %. Incision is clean and dry motor function appears intact.  Disposition: Discharge disposition: 01-Home or Self Care       Discharge Instructions    Diet - low sodium heart healthy   Complete by: As directed    Discharge wound care:   Complete by: As directed    Okay to shower. Do not apply salves or appointments to incision. No heavy lifting with the upper extremities greater than 15 pounds.   Incentive spirometry RT   Complete by: As directed    Increase activity slowly   Complete by: As directed         Signed: Stefani Dama 01/13/2020, 11:01 AM

## 2020-01-13 NOTE — TOC Transition Note (Addendum)
Transition of Care (TOC) - CM/SW Discharge Note Donn Pierini RN,BSN Transitions of Care Unit 4NP (non trauma) - RN Case Manager See Treatment Team for direct Phone #   Patient Details  Name: Aaron Castro MRN: [id number] Date of Birth: [birth year]  Transition of Care [facility]) CM/SW Contact:  [name], RN Phone Number: 01/13/2020, 11:25 AM   Clinical Narrative:    Pt stable for transition home today, orders placed for HHPT/OT- CM spoke with pt at bedside- per pt he has needed DME at home. Per pt he has had HH in past but can not remember name of agency- list provided for choice Per CMS guidelines from medicare.gov website with star ratings (copy placed in shadow chart)- on chart review in epic- pt was referred to [facility] in June 2020. Pt would like to see if [facility] can accept referral again for [facility] needs- if not then pt has no preference for another [facility] agency and will defer to CM to select one.  Address, phone # and PCP all confirmed with pt in epic, pt states he has a ride that will come after 1pm today to transport home.   Call made to [facility] with [facility] for [facility] referral- await return call to see if they can accept referral for HHPT/OT 1145- received return call from [facility]- they are able to accept however have a delay for start of care with therapies for July 26- will try to start sooner if able. Spoke with pt regarding delay for start of care and patient voices that he is ok with the delay start of care for July 26. Informed [name] with [facility] and they will f/u with pt post discharge for [facility] services.    Final next level of care: Home w Home Health Services Barriers to Discharge: No Barriers Identified   Patient Goals and CMS Choice Patient states their goals for this hospitalization and ongoing recovery are:: "to get better and hopefully not have any further surgeries" CMS Medicare.gov Compare Post Acute Care list provided to:: Patient Choice offered to / list  presented to : Patient  Discharge Placement                 Home with [facility]      Discharge Plan and Services   Discharge Planning Services: CM Consult Post Acute Care Choice: Home Health          DME Arranged: N/A DME Agency: NA       HH Arranged: PT, OT HH Agency: [facility] Date [facility] Agency Contacted: 01/13/20 Time HH Agency Contacted: 1124 Representative spoke with at [facility] Agency: Elnita Maxwell  Social Determinants of Health (SDOH) Interventions     Readmission Risk Interventions Readmission Risk Prevention Plan 01/13/2020 12/02/2018 11/05/2018  Post Dischage Appt Complete Complete Complete  Medication Screening Complete Complete Complete  Transportation Screening Complete Complete Complete  Some recent data might be hidden

## 2020-01-21 DIAGNOSIS — Z981 Arthrodesis status: Secondary | ICD-10-CM | POA: Diagnosis not present

## 2020-01-21 DIAGNOSIS — M150 Primary generalized (osteo)arthritis: Secondary | ICD-10-CM | POA: Diagnosis not present

## 2020-01-21 DIAGNOSIS — M48062 Spinal stenosis, lumbar region with neurogenic claudication: Secondary | ICD-10-CM | POA: Diagnosis not present

## 2020-01-21 DIAGNOSIS — M4714 Other spondylosis with myelopathy, thoracic region: Secondary | ICD-10-CM | POA: Diagnosis not present

## 2020-01-21 DIAGNOSIS — I10 Essential (primary) hypertension: Secondary | ICD-10-CM | POA: Diagnosis not present

## 2020-01-21 DIAGNOSIS — K5900 Constipation, unspecified: Secondary | ICD-10-CM | POA: Diagnosis not present

## 2020-01-21 DIAGNOSIS — G8929 Other chronic pain: Secondary | ICD-10-CM | POA: Diagnosis not present

## 2020-01-21 DIAGNOSIS — M48061 Spinal stenosis, lumbar region without neurogenic claudication: Secondary | ICD-10-CM | POA: Diagnosis not present

## 2020-01-21 DIAGNOSIS — Z4789 Encounter for other orthopedic aftercare: Secondary | ICD-10-CM | POA: Diagnosis not present

## 2020-01-21 DIAGNOSIS — M5002 Cervical disc disorder with myelopathy, mid-cervical region, unspecified level: Secondary | ICD-10-CM | POA: Diagnosis not present

## 2020-01-21 DIAGNOSIS — M4804 Spinal stenosis, thoracic region: Secondary | ICD-10-CM | POA: Diagnosis not present

## 2020-01-21 DIAGNOSIS — Z87820 Personal history of traumatic brain injury: Secondary | ICD-10-CM | POA: Diagnosis not present

## 2020-01-26 DIAGNOSIS — I10 Essential (primary) hypertension: Secondary | ICD-10-CM | POA: Diagnosis not present

## 2020-01-26 DIAGNOSIS — M4804 Spinal stenosis, thoracic region: Secondary | ICD-10-CM | POA: Diagnosis not present

## 2020-01-26 DIAGNOSIS — G8929 Other chronic pain: Secondary | ICD-10-CM | POA: Diagnosis not present

## 2020-01-26 DIAGNOSIS — Z87820 Personal history of traumatic brain injury: Secondary | ICD-10-CM | POA: Diagnosis not present

## 2020-01-26 DIAGNOSIS — M48062 Spinal stenosis, lumbar region with neurogenic claudication: Secondary | ICD-10-CM | POA: Diagnosis not present

## 2020-01-26 DIAGNOSIS — K5900 Constipation, unspecified: Secondary | ICD-10-CM | POA: Diagnosis not present

## 2020-01-26 DIAGNOSIS — M4714 Other spondylosis with myelopathy, thoracic region: Secondary | ICD-10-CM | POA: Diagnosis not present

## 2020-01-26 DIAGNOSIS — M48061 Spinal stenosis, lumbar region without neurogenic claudication: Secondary | ICD-10-CM | POA: Diagnosis not present

## 2020-01-26 DIAGNOSIS — M5002 Cervical disc disorder with myelopathy, mid-cervical region, unspecified level: Secondary | ICD-10-CM | POA: Diagnosis not present

## 2020-01-26 DIAGNOSIS — Z981 Arthrodesis status: Secondary | ICD-10-CM | POA: Diagnosis not present

## 2020-01-26 DIAGNOSIS — M150 Primary generalized (osteo)arthritis: Secondary | ICD-10-CM | POA: Diagnosis not present

## 2020-01-26 DIAGNOSIS — Z4789 Encounter for other orthopedic aftercare: Secondary | ICD-10-CM | POA: Diagnosis not present

## 2020-01-29 ENCOUNTER — Telehealth: Payer: Self-pay | Admitting: Family Medicine

## 2020-01-29 NOTE — Telephone Encounter (Signed)
Pt left message stating that he had surgery a week ago and was only given a weeks worth of pain medication from Dr Danielle Dess. He stated that he has been trying to get  ahold of them for 3 days with no response. He wanted to see if you could call him in something for pain. He uses the [facility] on [address]

## 2020-02-03 DIAGNOSIS — M4804 Spinal stenosis, thoracic region: Secondary | ICD-10-CM | POA: Diagnosis not present

## 2020-02-04 DIAGNOSIS — M48061 Spinal stenosis, lumbar region without neurogenic claudication: Secondary | ICD-10-CM | POA: Diagnosis not present

## 2020-02-04 DIAGNOSIS — M150 Primary generalized (osteo)arthritis: Secondary | ICD-10-CM | POA: Diagnosis not present

## 2020-02-04 DIAGNOSIS — M5002 Cervical disc disorder with myelopathy, mid-cervical region, unspecified level: Secondary | ICD-10-CM | POA: Diagnosis not present

## 2020-02-04 DIAGNOSIS — I10 Essential (primary) hypertension: Secondary | ICD-10-CM | POA: Diagnosis not present

## 2020-02-04 DIAGNOSIS — M4804 Spinal stenosis, thoracic region: Secondary | ICD-10-CM | POA: Diagnosis not present

## 2020-02-04 DIAGNOSIS — G8929 Other chronic pain: Secondary | ICD-10-CM | POA: Diagnosis not present

## 2020-02-04 DIAGNOSIS — Z981 Arthrodesis status: Secondary | ICD-10-CM | POA: Diagnosis not present

## 2020-02-04 DIAGNOSIS — K5900 Constipation, unspecified: Secondary | ICD-10-CM | POA: Diagnosis not present

## 2020-02-04 DIAGNOSIS — M4714 Other spondylosis with myelopathy, thoracic region: Secondary | ICD-10-CM | POA: Diagnosis not present

## 2020-02-04 DIAGNOSIS — Z87820 Personal history of traumatic brain injury: Secondary | ICD-10-CM | POA: Diagnosis not present

## 2020-02-04 DIAGNOSIS — Z4789 Encounter for other orthopedic aftercare: Secondary | ICD-10-CM | POA: Diagnosis not present

## 2020-02-04 DIAGNOSIS — M48062 Spinal stenosis, lumbar region with neurogenic claudication: Secondary | ICD-10-CM | POA: Diagnosis not present

## 2020-02-05 ENCOUNTER — Emergency Department (HOSPITAL_COMMUNITY): Payer: Medicare Other

## 2020-02-05 ENCOUNTER — Emergency Department (HOSPITAL_COMMUNITY)
Admission: EM | Admit: 2020-02-05 | Discharge: 2020-02-05 | Disposition: A | Discharge status: Home / Self Care | Payer: Medicare Other | Attending: Emergency Medicine | Admitting: Emergency Medicine

## 2020-02-05 ENCOUNTER — Other Ambulatory Visit: Payer: Self-pay

## 2020-02-05 DIAGNOSIS — T8131XD Disruption of external operation (surgical) wound, not elsewhere classified, subsequent encounter: Secondary | ICD-10-CM | POA: Diagnosis not present

## 2020-02-05 DIAGNOSIS — K5730 Diverticulosis of large intestine without perforation or abscess without bleeding: Secondary | ICD-10-CM | POA: Diagnosis not present

## 2020-02-05 DIAGNOSIS — R109 Unspecified abdominal pain: Secondary | ICD-10-CM | POA: Diagnosis not present

## 2020-02-05 DIAGNOSIS — Z87891 Personal history of nicotine dependence: Secondary | ICD-10-CM | POA: Insufficient documentation

## 2020-02-05 DIAGNOSIS — I10 Essential (primary) hypertension: Secondary | ICD-10-CM | POA: Insufficient documentation

## 2020-02-05 DIAGNOSIS — N200 Calculus of kidney: Secondary | ICD-10-CM | POA: Diagnosis not present

## 2020-02-05 DIAGNOSIS — K439 Ventral hernia without obstruction or gangrene: Secondary | ICD-10-CM | POA: Diagnosis not present

## 2020-02-05 DIAGNOSIS — Z79899 Other long term (current) drug therapy: Secondary | ICD-10-CM | POA: Diagnosis not present

## 2020-02-05 DIAGNOSIS — I700 Atherosclerosis of aorta: Secondary | ICD-10-CM | POA: Diagnosis not present

## 2020-02-05 LAB — URINALYSIS, ROUTINE W REFLEX MICROSCOPIC
Bilirubin Urine: NEGATIVE
Glucose, UA: NEGATIVE mg/dL
Hgb urine dipstick: NEGATIVE
Ketones, ur: NEGATIVE mg/dL
Leukocytes,Ua: NEGATIVE
Nitrite: NEGATIVE
Protein, ur: NEGATIVE mg/dL
Specific Gravity, Urine: 1.009 (ref 1.005–1.030)
pH: 5 (ref 5.0–8.0)

## 2020-02-05 IMAGING — CT CT RENAL STONE PROTOCOL
2 of 4 series · 15 of 46 positions shown, 17 images · non-contrast
Comparison: [DATE]

CLINICAL DATA: Right flank pain

EXAM:
CT ABDOMEN AND PELVIS WITHOUT CONTRAST
TECHNIQUE: Multidetector CT imaging of the abdomen and pelvis was performed
following the standard protocol without IV contrast.

[Series 3: stone study 5.0 i30f 2 · axial · 0.98mm/px · z∈[-214,+240]mm · 12 of 101 slices shown, 14 images]
[im 5/101  soft-tissue]
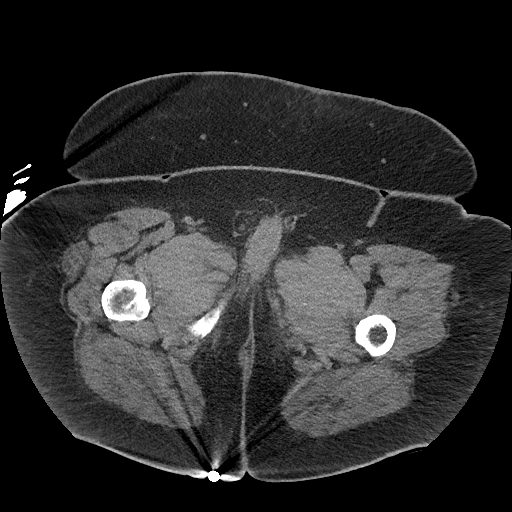
[im 5/101  bone]
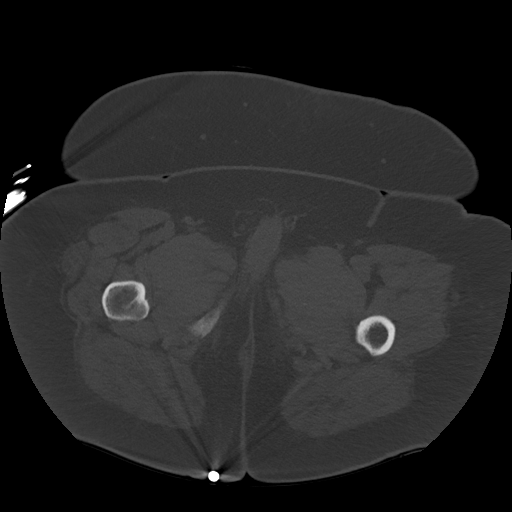
[im 14/101  soft-tissue]
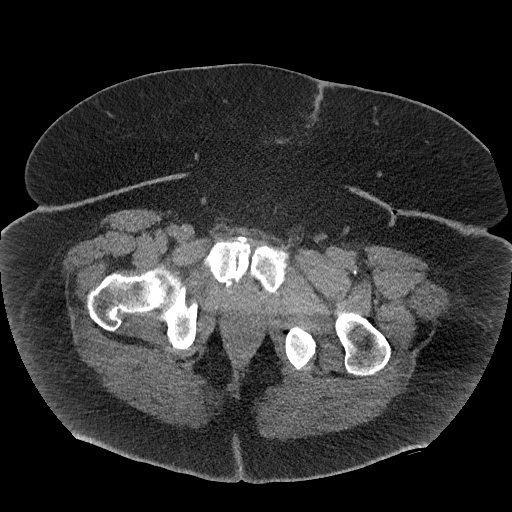
[im 22/101  soft-tissue]
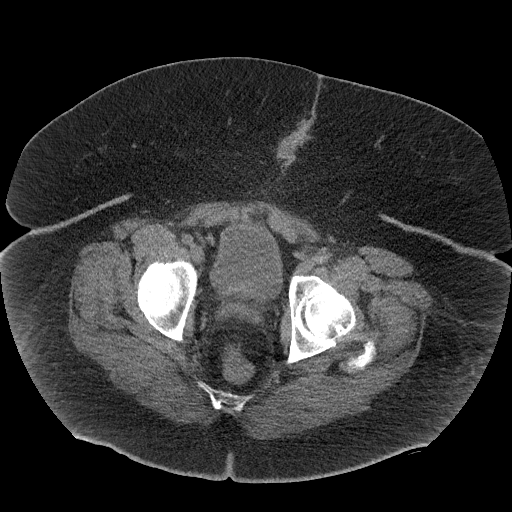
[im 31/101  soft-tissue]
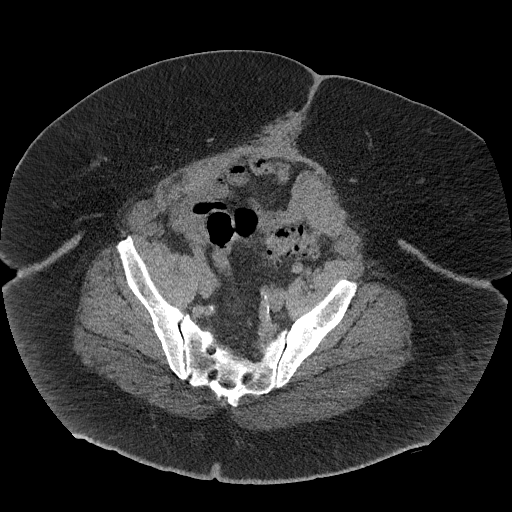
[im 40/101  soft-tissue]
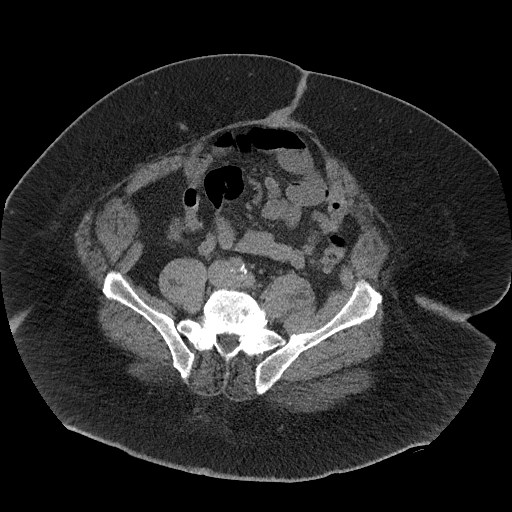
[im 48/101  soft-tissue]
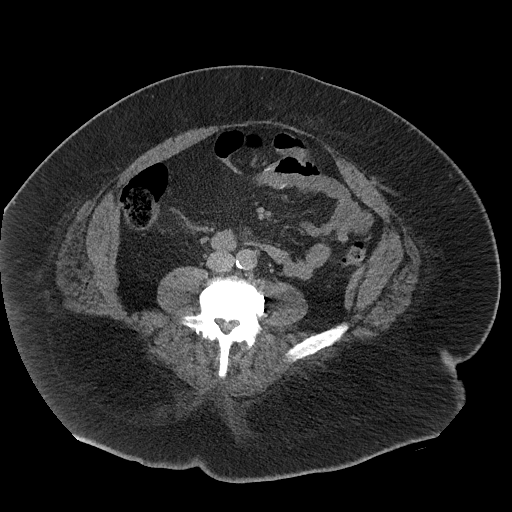
[im 53/101  soft-tissue]
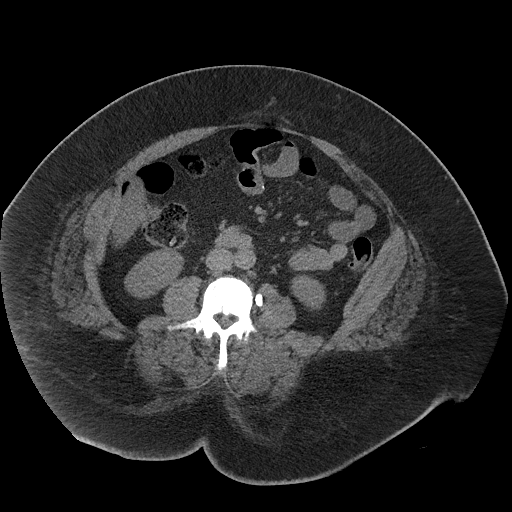
[im 61/101  soft-tissue]
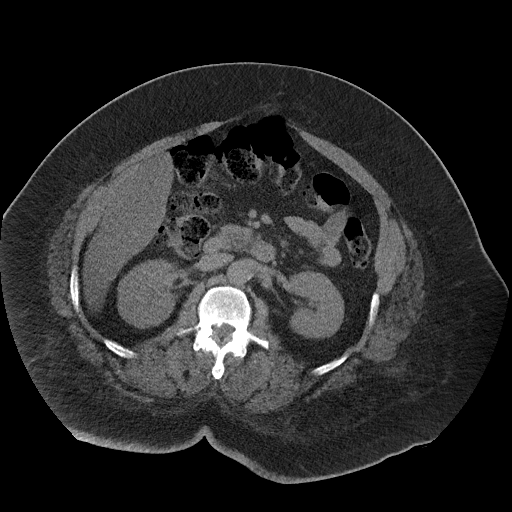
[im 70/101  soft-tissue]
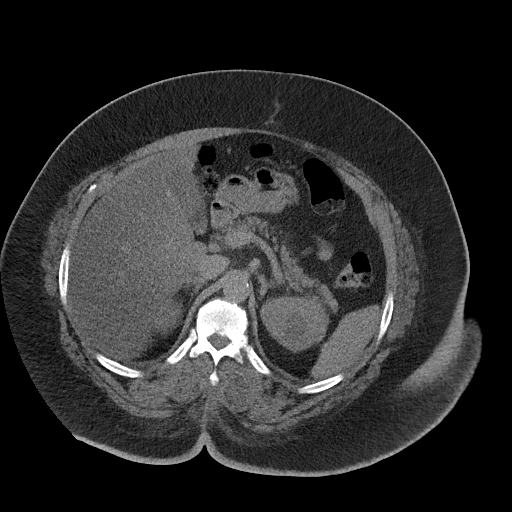
[im 70/101  bone]
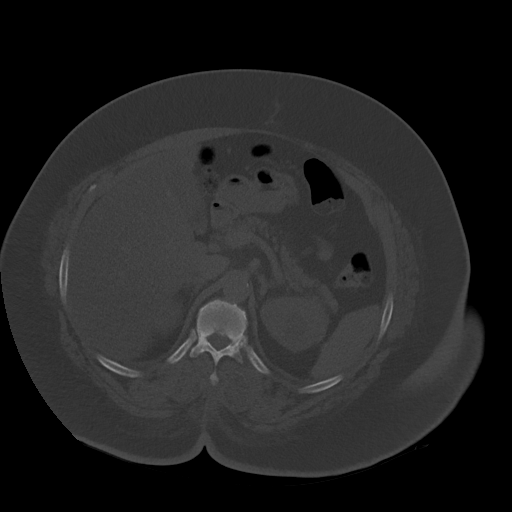
[im 79/101  soft-tissue]
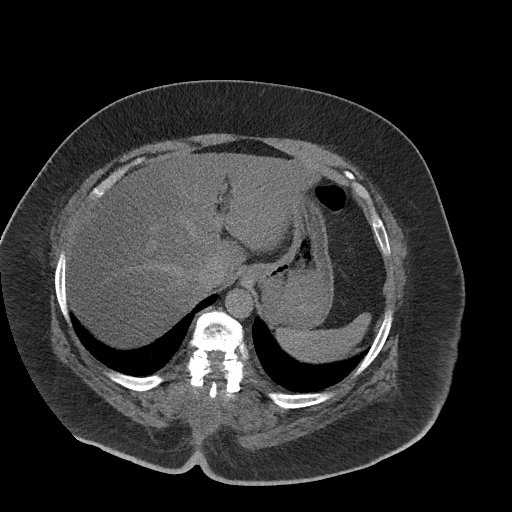
[im 87/101  soft-tissue]
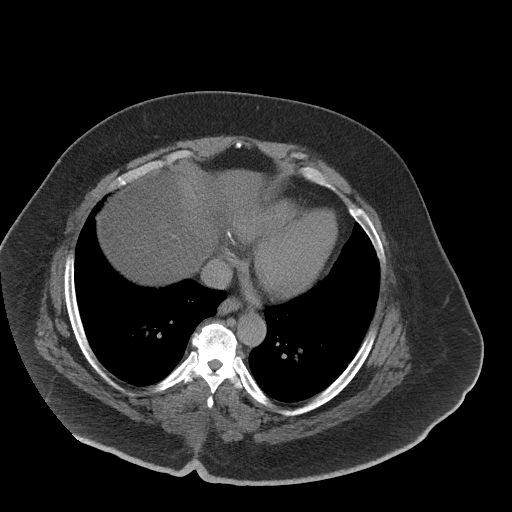
[im 96/101  soft-tissue]
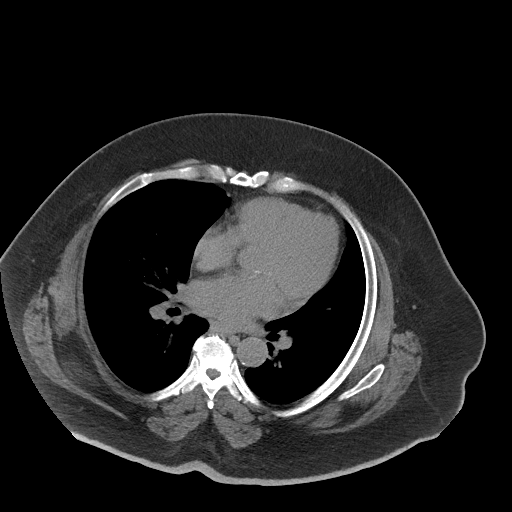

[Series 6: coronal soft tissue · coronal · 0.98mm/px · 3 of 156 slices shown]
[im 52/156  soft-tissue]
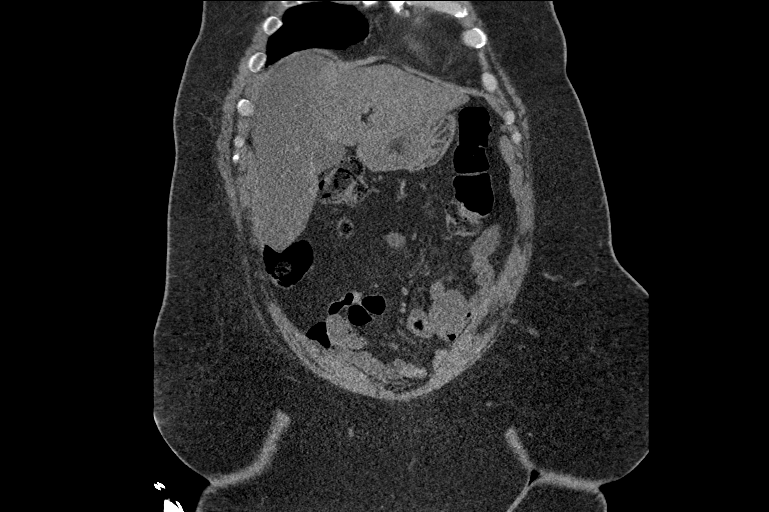
[im 69/156  soft-tissue]
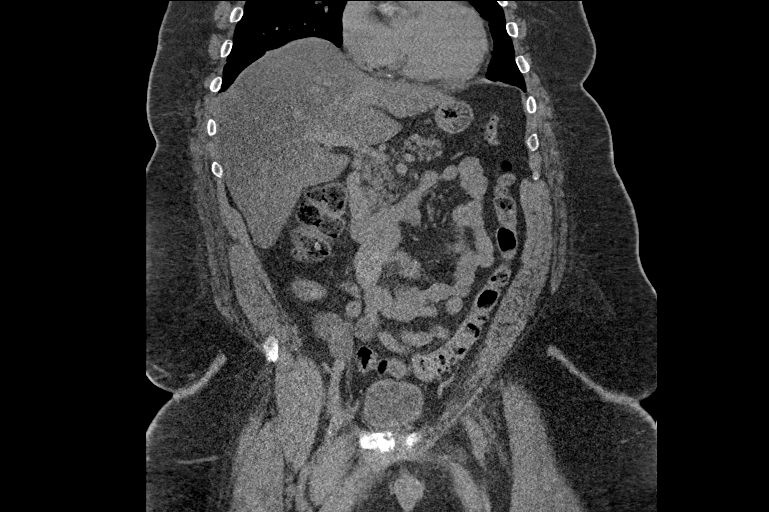
[im 87/156  soft-tissue]
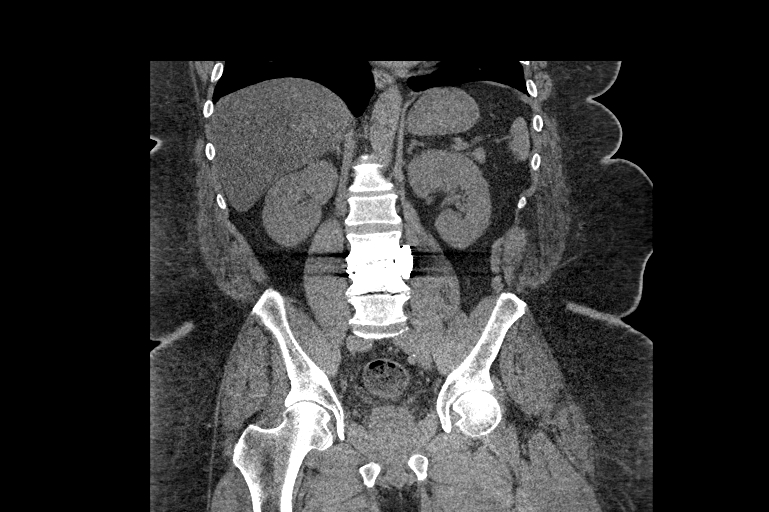

[15 of 46 positions shown; findings below may reference images not displayed]

FINDINGS: Lower chest: Calcifications in the visualized coronary arteries. No
acute abnormality.

Hepatobiliary: Diffuse low-density throughout the liver compatible
with fatty infiltration. No focal abnormality. Gallbladder
unremarkable.

Pancreas: No focal abnormality or ductal dilatation.

Spleen: No focal abnormality.  Normal size.

Adrenals/Urinary Tract: No ureteral stones. Punctate stone in the
lower pole of the left kidney. No hydronephrosis. Left upper pole
cyst is unchanged since prior study. Adrenal glands and urinary
bladder unremarkable.

Stomach/Bowel: Appendix is mildly prominent measuring up 2 11 mm in
diameter. This is similar to prior study. No surrounding
inflammation. Scattered left colonic diverticula. No active
diverticulitis. Stomach and small bowel decompressed, unremarkable.

Vascular/Lymphatic: Aortic atherosclerosis. No evidence of aneurysm
or adenopathy.

Reproductive: Mildly prominent prostate.

Other: No free fluid or free air. Midline supraumbilical ventral
hernia contains a small bowel loop. No obstruction.

Musculoskeletal: No acute bony abnormality.
IMPRESSION: Punctate left lower pole nephrolithiasis. No ureteral stones or
hydronephrosis.

Diffuse fatty infiltration of the liver.

Aortic atherosclerosis.

Supraumbilical midline ventral hernia containing a small bowel loop.
No obstruction.

Left colonic diverticulosis.  No active diverticulitis.

## 2020-02-05 MED ORDER — HYDROMORPHONE HCL 1 MG/ML IJ SOLN
1.0000 mg | Freq: Once | INTRAMUSCULAR | Status: AC
  Administered 2020-02-05: 1 mg via INTRAMUSCULAR
  Filled 2020-02-05: qty 1

## 2020-02-05 MED ORDER — METHOCARBAMOL 750 MG PO TABS
750.0000 mg | ORAL_TABLET | Freq: Three times a day (TID) | ORAL | 0 refills | Status: DC | PRN
Start: 2020-02-05 — End: 2021-01-02

## 2020-02-05 NOTE — Discharge Instructions (Addendum)
It was our pleasure to provide your ER care today - we hope that you feel better.  Continue to keep your wound very clean, and do dressing changes as instructed by your doctor.   Take acetaminophen as need for pain. You may also take robaxin as need for muscle pain/spasm - no driving for the next 6 hours, or when taking robaxin.   Follow up with your neurosurgeon as planned.   Follow up with primary care doctor in 1 week. Have your blood pressure rechecked then, as it is high today.   Incidental note was made on your scan of coronary calcifications, and ventral hernia - discuss with your doctor at follow up with them.  Return to ER if worse, new symptoms, fevers, worsening or severe pain, vomiting, chest pain, trouble breathing, infection of wound, or other concern.

## 2020-02-05 NOTE — ED Triage Notes (Signed)
Per pt he started having right sided flank pain today. He is saying that it hurts to turn move or lay. straiinng makes it worse.
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
## 2020-02-09 DIAGNOSIS — M48062 Spinal stenosis, lumbar region with neurogenic claudication: Secondary | ICD-10-CM | POA: Diagnosis not present

## 2020-02-09 DIAGNOSIS — M5002 Cervical disc disorder with myelopathy, mid-cervical region, unspecified level: Secondary | ICD-10-CM | POA: Diagnosis not present

## 2020-02-09 DIAGNOSIS — Z981 Arthrodesis status: Secondary | ICD-10-CM | POA: Diagnosis not present

## 2020-02-09 DIAGNOSIS — K5900 Constipation, unspecified: Secondary | ICD-10-CM | POA: Diagnosis not present

## 2020-02-09 DIAGNOSIS — M4804 Spinal stenosis, thoracic region: Secondary | ICD-10-CM | POA: Diagnosis not present

## 2020-02-09 DIAGNOSIS — I10 Essential (primary) hypertension: Secondary | ICD-10-CM | POA: Diagnosis not present

## 2020-02-09 DIAGNOSIS — M4714 Other spondylosis with myelopathy, thoracic region: Secondary | ICD-10-CM | POA: Diagnosis not present

## 2020-02-09 DIAGNOSIS — Z4789 Encounter for other orthopedic aftercare: Secondary | ICD-10-CM | POA: Diagnosis not present

## 2020-02-09 DIAGNOSIS — M48061 Spinal stenosis, lumbar region without neurogenic claudication: Secondary | ICD-10-CM | POA: Diagnosis not present

## 2020-02-09 DIAGNOSIS — G8929 Other chronic pain: Secondary | ICD-10-CM | POA: Diagnosis not present

## 2020-02-09 DIAGNOSIS — M150 Primary generalized (osteo)arthritis: Secondary | ICD-10-CM | POA: Diagnosis not present

## 2020-02-09 DIAGNOSIS — Z87820 Personal history of traumatic brain injury: Secondary | ICD-10-CM | POA: Diagnosis not present

## 2020-02-11 DIAGNOSIS — M4714 Other spondylosis with myelopathy, thoracic region: Secondary | ICD-10-CM | POA: Diagnosis not present

## 2020-02-11 DIAGNOSIS — I10 Essential (primary) hypertension: Secondary | ICD-10-CM | POA: Diagnosis not present

## 2020-02-11 DIAGNOSIS — Z87820 Personal history of traumatic brain injury: Secondary | ICD-10-CM | POA: Diagnosis not present

## 2020-02-11 DIAGNOSIS — M5002 Cervical disc disorder with myelopathy, mid-cervical region, unspecified level: Secondary | ICD-10-CM | POA: Diagnosis not present

## 2020-02-11 DIAGNOSIS — M48062 Spinal stenosis, lumbar region with neurogenic claudication: Secondary | ICD-10-CM | POA: Diagnosis not present

## 2020-02-11 DIAGNOSIS — M48061 Spinal stenosis, lumbar region without neurogenic claudication: Secondary | ICD-10-CM | POA: Diagnosis not present

## 2020-02-11 DIAGNOSIS — K5900 Constipation, unspecified: Secondary | ICD-10-CM | POA: Diagnosis not present

## 2020-02-11 DIAGNOSIS — G8929 Other chronic pain: Secondary | ICD-10-CM | POA: Diagnosis not present

## 2020-02-11 DIAGNOSIS — M4804 Spinal stenosis, thoracic region: Secondary | ICD-10-CM | POA: Diagnosis not present

## 2020-02-11 DIAGNOSIS — Z4789 Encounter for other orthopedic aftercare: Secondary | ICD-10-CM | POA: Diagnosis not present

## 2020-02-11 DIAGNOSIS — M150 Primary generalized (osteo)arthritis: Secondary | ICD-10-CM | POA: Diagnosis not present

## 2020-02-11 DIAGNOSIS — Z981 Arthrodesis status: Secondary | ICD-10-CM | POA: Diagnosis not present

## 2020-02-12 DIAGNOSIS — Z981 Arthrodesis status: Secondary | ICD-10-CM | POA: Diagnosis not present

## 2020-02-12 DIAGNOSIS — Z4789 Encounter for other orthopedic aftercare: Secondary | ICD-10-CM | POA: Diagnosis not present

## 2020-02-12 DIAGNOSIS — M150 Primary generalized (osteo)arthritis: Secondary | ICD-10-CM | POA: Diagnosis not present

## 2020-02-12 DIAGNOSIS — M5002 Cervical disc disorder with myelopathy, mid-cervical region, unspecified level: Secondary | ICD-10-CM | POA: Diagnosis not present

## 2020-02-12 DIAGNOSIS — Z87820 Personal history of traumatic brain injury: Secondary | ICD-10-CM | POA: Diagnosis not present

## 2020-02-12 DIAGNOSIS — M48061 Spinal stenosis, lumbar region without neurogenic claudication: Secondary | ICD-10-CM | POA: Diagnosis not present

## 2020-02-12 DIAGNOSIS — M4804 Spinal stenosis, thoracic region: Secondary | ICD-10-CM | POA: Diagnosis not present

## 2020-02-12 DIAGNOSIS — M4714 Other spondylosis with myelopathy, thoracic region: Secondary | ICD-10-CM | POA: Diagnosis not present

## 2020-02-12 DIAGNOSIS — K5900 Constipation, unspecified: Secondary | ICD-10-CM | POA: Diagnosis not present

## 2020-02-12 DIAGNOSIS — I10 Essential (primary) hypertension: Secondary | ICD-10-CM | POA: Diagnosis not present

## 2020-02-12 DIAGNOSIS — G8929 Other chronic pain: Secondary | ICD-10-CM | POA: Diagnosis not present

## 2020-02-12 DIAGNOSIS — M48062 Spinal stenosis, lumbar region with neurogenic claudication: Secondary | ICD-10-CM | POA: Diagnosis not present

## 2020-02-15 DIAGNOSIS — Z981 Arthrodesis status: Secondary | ICD-10-CM | POA: Diagnosis not present

## 2020-02-15 DIAGNOSIS — I10 Essential (primary) hypertension: Secondary | ICD-10-CM | POA: Diagnosis not present

## 2020-02-15 DIAGNOSIS — M48062 Spinal stenosis, lumbar region with neurogenic claudication: Secondary | ICD-10-CM | POA: Diagnosis not present

## 2020-02-15 DIAGNOSIS — M48061 Spinal stenosis, lumbar region without neurogenic claudication: Secondary | ICD-10-CM | POA: Diagnosis not present

## 2020-02-15 DIAGNOSIS — M5002 Cervical disc disorder with myelopathy, mid-cervical region, unspecified level: Secondary | ICD-10-CM | POA: Diagnosis not present

## 2020-02-15 DIAGNOSIS — K5900 Constipation, unspecified: Secondary | ICD-10-CM | POA: Diagnosis not present

## 2020-02-15 DIAGNOSIS — M4804 Spinal stenosis, thoracic region: Secondary | ICD-10-CM | POA: Diagnosis not present

## 2020-02-15 DIAGNOSIS — Z87820 Personal history of traumatic brain injury: Secondary | ICD-10-CM | POA: Diagnosis not present

## 2020-02-15 DIAGNOSIS — Z4789 Encounter for other orthopedic aftercare: Secondary | ICD-10-CM | POA: Diagnosis not present

## 2020-02-15 DIAGNOSIS — M150 Primary generalized (osteo)arthritis: Secondary | ICD-10-CM | POA: Diagnosis not present

## 2020-02-15 DIAGNOSIS — M4714 Other spondylosis with myelopathy, thoracic region: Secondary | ICD-10-CM | POA: Diagnosis not present

## 2020-02-15 DIAGNOSIS — G8929 Other chronic pain: Secondary | ICD-10-CM | POA: Diagnosis not present

## 2020-02-16 DIAGNOSIS — Z981 Arthrodesis status: Secondary | ICD-10-CM | POA: Diagnosis not present

## 2020-02-16 DIAGNOSIS — G8929 Other chronic pain: Secondary | ICD-10-CM | POA: Diagnosis not present

## 2020-02-16 DIAGNOSIS — M4804 Spinal stenosis, thoracic region: Secondary | ICD-10-CM | POA: Diagnosis not present

## 2020-02-16 DIAGNOSIS — Z4789 Encounter for other orthopedic aftercare: Secondary | ICD-10-CM | POA: Diagnosis not present

## 2020-02-16 DIAGNOSIS — I10 Essential (primary) hypertension: Secondary | ICD-10-CM | POA: Diagnosis not present

## 2020-02-16 DIAGNOSIS — M150 Primary generalized (osteo)arthritis: Secondary | ICD-10-CM | POA: Diagnosis not present

## 2020-02-16 DIAGNOSIS — K5900 Constipation, unspecified: Secondary | ICD-10-CM | POA: Diagnosis not present

## 2020-02-16 DIAGNOSIS — Z87820 Personal history of traumatic brain injury: Secondary | ICD-10-CM | POA: Diagnosis not present

## 2020-02-16 DIAGNOSIS — M4714 Other spondylosis with myelopathy, thoracic region: Secondary | ICD-10-CM | POA: Diagnosis not present

## 2020-02-16 DIAGNOSIS — M5002 Cervical disc disorder with myelopathy, mid-cervical region, unspecified level: Secondary | ICD-10-CM | POA: Diagnosis not present

## 2020-02-16 DIAGNOSIS — M48062 Spinal stenosis, lumbar region with neurogenic claudication: Secondary | ICD-10-CM | POA: Diagnosis not present

## 2020-02-16 DIAGNOSIS — M48061 Spinal stenosis, lumbar region without neurogenic claudication: Secondary | ICD-10-CM | POA: Diagnosis not present

## 2020-02-18 DIAGNOSIS — Z981 Arthrodesis status: Secondary | ICD-10-CM | POA: Diagnosis not present

## 2020-02-18 DIAGNOSIS — G8929 Other chronic pain: Secondary | ICD-10-CM | POA: Diagnosis not present

## 2020-02-18 DIAGNOSIS — M48061 Spinal stenosis, lumbar region without neurogenic claudication: Secondary | ICD-10-CM | POA: Diagnosis not present

## 2020-02-18 DIAGNOSIS — Z87820 Personal history of traumatic brain injury: Secondary | ICD-10-CM | POA: Diagnosis not present

## 2020-02-18 DIAGNOSIS — M4714 Other spondylosis with myelopathy, thoracic region: Secondary | ICD-10-CM | POA: Diagnosis not present

## 2020-02-18 DIAGNOSIS — Z4789 Encounter for other orthopedic aftercare: Secondary | ICD-10-CM | POA: Diagnosis not present

## 2020-02-18 DIAGNOSIS — M5002 Cervical disc disorder with myelopathy, mid-cervical region, unspecified level: Secondary | ICD-10-CM | POA: Diagnosis not present

## 2020-02-18 DIAGNOSIS — M150 Primary generalized (osteo)arthritis: Secondary | ICD-10-CM | POA: Diagnosis not present

## 2020-02-18 DIAGNOSIS — K5900 Constipation, unspecified: Secondary | ICD-10-CM | POA: Diagnosis not present

## 2020-02-18 DIAGNOSIS — I10 Essential (primary) hypertension: Secondary | ICD-10-CM | POA: Diagnosis not present

## 2020-02-18 DIAGNOSIS — M4804 Spinal stenosis, thoracic region: Secondary | ICD-10-CM | POA: Diagnosis not present

## 2020-02-18 DIAGNOSIS — M48062 Spinal stenosis, lumbar region with neurogenic claudication: Secondary | ICD-10-CM | POA: Diagnosis not present

## 2020-02-23 DIAGNOSIS — Z87820 Personal history of traumatic brain injury: Secondary | ICD-10-CM | POA: Diagnosis not present

## 2020-02-23 DIAGNOSIS — M48061 Spinal stenosis, lumbar region without neurogenic claudication: Secondary | ICD-10-CM | POA: Diagnosis not present

## 2020-02-23 DIAGNOSIS — M4804 Spinal stenosis, thoracic region: Secondary | ICD-10-CM | POA: Diagnosis not present

## 2020-02-23 DIAGNOSIS — M4714 Other spondylosis with myelopathy, thoracic region: Secondary | ICD-10-CM | POA: Diagnosis not present

## 2020-02-23 DIAGNOSIS — I10 Essential (primary) hypertension: Secondary | ICD-10-CM | POA: Diagnosis not present

## 2020-02-23 DIAGNOSIS — K5900 Constipation, unspecified: Secondary | ICD-10-CM | POA: Diagnosis not present

## 2020-02-23 DIAGNOSIS — M5002 Cervical disc disorder with myelopathy, mid-cervical region, unspecified level: Secondary | ICD-10-CM | POA: Diagnosis not present

## 2020-02-23 DIAGNOSIS — M48062 Spinal stenosis, lumbar region with neurogenic claudication: Secondary | ICD-10-CM | POA: Diagnosis not present

## 2020-02-23 DIAGNOSIS — Z981 Arthrodesis status: Secondary | ICD-10-CM | POA: Diagnosis not present

## 2020-02-23 DIAGNOSIS — Z4789 Encounter for other orthopedic aftercare: Secondary | ICD-10-CM | POA: Diagnosis not present

## 2020-02-23 DIAGNOSIS — G8929 Other chronic pain: Secondary | ICD-10-CM | POA: Diagnosis not present

## 2020-02-23 DIAGNOSIS — M150 Primary generalized (osteo)arthritis: Secondary | ICD-10-CM | POA: Diagnosis not present

## 2020-02-25 DIAGNOSIS — K5900 Constipation, unspecified: Secondary | ICD-10-CM | POA: Diagnosis not present

## 2020-02-25 DIAGNOSIS — M48062 Spinal stenosis, lumbar region with neurogenic claudication: Secondary | ICD-10-CM | POA: Diagnosis not present

## 2020-02-25 DIAGNOSIS — G8929 Other chronic pain: Secondary | ICD-10-CM | POA: Diagnosis not present

## 2020-02-25 DIAGNOSIS — M48061 Spinal stenosis, lumbar region without neurogenic claudication: Secondary | ICD-10-CM | POA: Diagnosis not present

## 2020-02-25 DIAGNOSIS — Z87820 Personal history of traumatic brain injury: Secondary | ICD-10-CM | POA: Diagnosis not present

## 2020-02-25 DIAGNOSIS — M4804 Spinal stenosis, thoracic region: Secondary | ICD-10-CM | POA: Diagnosis not present

## 2020-02-25 DIAGNOSIS — Z4789 Encounter for other orthopedic aftercare: Secondary | ICD-10-CM | POA: Diagnosis not present

## 2020-02-25 DIAGNOSIS — Z981 Arthrodesis status: Secondary | ICD-10-CM | POA: Diagnosis not present

## 2020-02-25 DIAGNOSIS — I10 Essential (primary) hypertension: Secondary | ICD-10-CM | POA: Diagnosis not present

## 2020-02-25 DIAGNOSIS — M150 Primary generalized (osteo)arthritis: Secondary | ICD-10-CM | POA: Diagnosis not present

## 2020-02-25 DIAGNOSIS — M5002 Cervical disc disorder with myelopathy, mid-cervical region, unspecified level: Secondary | ICD-10-CM | POA: Diagnosis not present

## 2020-02-25 DIAGNOSIS — M4714 Other spondylosis with myelopathy, thoracic region: Secondary | ICD-10-CM | POA: Diagnosis not present

## 2020-02-29 DIAGNOSIS — M150 Primary generalized (osteo)arthritis: Secondary | ICD-10-CM | POA: Diagnosis not present

## 2020-02-29 DIAGNOSIS — M48062 Spinal stenosis, lumbar region with neurogenic claudication: Secondary | ICD-10-CM | POA: Diagnosis not present

## 2020-02-29 DIAGNOSIS — M48061 Spinal stenosis, lumbar region without neurogenic claudication: Secondary | ICD-10-CM | POA: Diagnosis not present

## 2020-02-29 DIAGNOSIS — M4714 Other spondylosis with myelopathy, thoracic region: Secondary | ICD-10-CM | POA: Diagnosis not present

## 2020-02-29 DIAGNOSIS — K5900 Constipation, unspecified: Secondary | ICD-10-CM | POA: Diagnosis not present

## 2020-02-29 DIAGNOSIS — I10 Essential (primary) hypertension: Secondary | ICD-10-CM | POA: Diagnosis not present

## 2020-02-29 DIAGNOSIS — Z4789 Encounter for other orthopedic aftercare: Secondary | ICD-10-CM | POA: Diagnosis not present

## 2020-02-29 DIAGNOSIS — M4804 Spinal stenosis, thoracic region: Secondary | ICD-10-CM | POA: Diagnosis not present

## 2020-02-29 DIAGNOSIS — G8929 Other chronic pain: Secondary | ICD-10-CM | POA: Diagnosis not present

## 2020-02-29 DIAGNOSIS — M5002 Cervical disc disorder with myelopathy, mid-cervical region, unspecified level: Secondary | ICD-10-CM | POA: Diagnosis not present

## 2020-02-29 DIAGNOSIS — Z981 Arthrodesis status: Secondary | ICD-10-CM | POA: Diagnosis not present

## 2020-02-29 DIAGNOSIS — Z87820 Personal history of traumatic brain injury: Secondary | ICD-10-CM | POA: Diagnosis not present

## 2020-03-04 DIAGNOSIS — M4804 Spinal stenosis, thoracic region: Secondary | ICD-10-CM | POA: Diagnosis not present

## 2020-03-04 DIAGNOSIS — M5002 Cervical disc disorder with myelopathy, mid-cervical region, unspecified level: Secondary | ICD-10-CM | POA: Diagnosis not present

## 2020-03-04 DIAGNOSIS — K5900 Constipation, unspecified: Secondary | ICD-10-CM | POA: Diagnosis not present

## 2020-03-04 DIAGNOSIS — Z87820 Personal history of traumatic brain injury: Secondary | ICD-10-CM | POA: Diagnosis not present

## 2020-03-04 DIAGNOSIS — Z4789 Encounter for other orthopedic aftercare: Secondary | ICD-10-CM | POA: Diagnosis not present

## 2020-03-04 DIAGNOSIS — M4714 Other spondylosis with myelopathy, thoracic region: Secondary | ICD-10-CM | POA: Diagnosis not present

## 2020-03-04 DIAGNOSIS — G8929 Other chronic pain: Secondary | ICD-10-CM | POA: Diagnosis not present

## 2020-03-04 DIAGNOSIS — M150 Primary generalized (osteo)arthritis: Secondary | ICD-10-CM | POA: Diagnosis not present

## 2020-03-04 DIAGNOSIS — M48062 Spinal stenosis, lumbar region with neurogenic claudication: Secondary | ICD-10-CM | POA: Diagnosis not present

## 2020-03-04 DIAGNOSIS — I10 Essential (primary) hypertension: Secondary | ICD-10-CM | POA: Diagnosis not present

## 2020-03-04 DIAGNOSIS — M48061 Spinal stenosis, lumbar region without neurogenic claudication: Secondary | ICD-10-CM | POA: Diagnosis not present

## 2020-03-04 DIAGNOSIS — Z981 Arthrodesis status: Secondary | ICD-10-CM | POA: Diagnosis not present

## 2020-03-07 DIAGNOSIS — Z981 Arthrodesis status: Secondary | ICD-10-CM | POA: Diagnosis not present

## 2020-03-07 DIAGNOSIS — M150 Primary generalized (osteo)arthritis: Secondary | ICD-10-CM | POA: Diagnosis not present

## 2020-03-07 DIAGNOSIS — I10 Essential (primary) hypertension: Secondary | ICD-10-CM | POA: Diagnosis not present

## 2020-03-07 DIAGNOSIS — Z4789 Encounter for other orthopedic aftercare: Secondary | ICD-10-CM | POA: Diagnosis not present

## 2020-03-07 DIAGNOSIS — M4804 Spinal stenosis, thoracic region: Secondary | ICD-10-CM | POA: Diagnosis not present

## 2020-03-07 DIAGNOSIS — M48061 Spinal stenosis, lumbar region without neurogenic claudication: Secondary | ICD-10-CM | POA: Diagnosis not present

## 2020-03-07 DIAGNOSIS — M4714 Other spondylosis with myelopathy, thoracic region: Secondary | ICD-10-CM | POA: Diagnosis not present

## 2020-03-07 DIAGNOSIS — Z87820 Personal history of traumatic brain injury: Secondary | ICD-10-CM | POA: Diagnosis not present

## 2020-03-07 DIAGNOSIS — M48062 Spinal stenosis, lumbar region with neurogenic claudication: Secondary | ICD-10-CM | POA: Diagnosis not present

## 2020-03-07 DIAGNOSIS — M5002 Cervical disc disorder with myelopathy, mid-cervical region, unspecified level: Secondary | ICD-10-CM | POA: Diagnosis not present

## 2020-03-07 DIAGNOSIS — G8929 Other chronic pain: Secondary | ICD-10-CM | POA: Diagnosis not present

## 2020-03-07 DIAGNOSIS — K5900 Constipation, unspecified: Secondary | ICD-10-CM | POA: Diagnosis not present

## 2020-03-08 DIAGNOSIS — M4714 Other spondylosis with myelopathy, thoracic region: Secondary | ICD-10-CM | POA: Diagnosis not present

## 2020-03-08 DIAGNOSIS — Z4789 Encounter for other orthopedic aftercare: Secondary | ICD-10-CM | POA: Diagnosis not present

## 2020-03-08 DIAGNOSIS — Z87820 Personal history of traumatic brain injury: Secondary | ICD-10-CM | POA: Diagnosis not present

## 2020-03-08 DIAGNOSIS — Z981 Arthrodesis status: Secondary | ICD-10-CM | POA: Diagnosis not present

## 2020-03-08 DIAGNOSIS — M5002 Cervical disc disorder with myelopathy, mid-cervical region, unspecified level: Secondary | ICD-10-CM | POA: Diagnosis not present

## 2020-03-08 DIAGNOSIS — M48062 Spinal stenosis, lumbar region with neurogenic claudication: Secondary | ICD-10-CM | POA: Diagnosis not present

## 2020-03-08 DIAGNOSIS — K5900 Constipation, unspecified: Secondary | ICD-10-CM | POA: Diagnosis not present

## 2020-03-08 DIAGNOSIS — G8929 Other chronic pain: Secondary | ICD-10-CM | POA: Diagnosis not present

## 2020-03-08 DIAGNOSIS — M48061 Spinal stenosis, lumbar region without neurogenic claudication: Secondary | ICD-10-CM | POA: Diagnosis not present

## 2020-03-08 DIAGNOSIS — M150 Primary generalized (osteo)arthritis: Secondary | ICD-10-CM | POA: Diagnosis not present

## 2020-03-08 DIAGNOSIS — M4804 Spinal stenosis, thoracic region: Secondary | ICD-10-CM | POA: Diagnosis not present

## 2020-03-08 DIAGNOSIS — I10 Essential (primary) hypertension: Secondary | ICD-10-CM | POA: Diagnosis not present

## 2020-03-10 DIAGNOSIS — M4714 Other spondylosis with myelopathy, thoracic region: Secondary | ICD-10-CM | POA: Diagnosis not present

## 2020-03-10 DIAGNOSIS — Z4789 Encounter for other orthopedic aftercare: Secondary | ICD-10-CM | POA: Diagnosis not present

## 2020-03-10 DIAGNOSIS — K5900 Constipation, unspecified: Secondary | ICD-10-CM | POA: Diagnosis not present

## 2020-03-10 DIAGNOSIS — Z981 Arthrodesis status: Secondary | ICD-10-CM | POA: Diagnosis not present

## 2020-03-10 DIAGNOSIS — M4804 Spinal stenosis, thoracic region: Secondary | ICD-10-CM | POA: Diagnosis not present

## 2020-03-10 DIAGNOSIS — Z87820 Personal history of traumatic brain injury: Secondary | ICD-10-CM | POA: Diagnosis not present

## 2020-03-10 DIAGNOSIS — M150 Primary generalized (osteo)arthritis: Secondary | ICD-10-CM | POA: Diagnosis not present

## 2020-03-10 DIAGNOSIS — M48061 Spinal stenosis, lumbar region without neurogenic claudication: Secondary | ICD-10-CM | POA: Diagnosis not present

## 2020-03-10 DIAGNOSIS — M5002 Cervical disc disorder with myelopathy, mid-cervical region, unspecified level: Secondary | ICD-10-CM | POA: Diagnosis not present

## 2020-03-10 DIAGNOSIS — M48062 Spinal stenosis, lumbar region with neurogenic claudication: Secondary | ICD-10-CM | POA: Diagnosis not present

## 2020-03-10 DIAGNOSIS — I10 Essential (primary) hypertension: Secondary | ICD-10-CM | POA: Diagnosis not present

## 2020-03-10 DIAGNOSIS — G8929 Other chronic pain: Secondary | ICD-10-CM | POA: Diagnosis not present

## 2020-03-12 ENCOUNTER — Other Ambulatory Visit: Payer: Self-pay | Admitting: Family Medicine

## 2020-03-12 DIAGNOSIS — I10 Essential (primary) hypertension: Secondary | ICD-10-CM | POA: Diagnosis not present

## 2020-03-12 DIAGNOSIS — G8929 Other chronic pain: Secondary | ICD-10-CM | POA: Diagnosis not present

## 2020-03-12 DIAGNOSIS — M5002 Cervical disc disorder with myelopathy, mid-cervical region, unspecified level: Secondary | ICD-10-CM | POA: Diagnosis not present

## 2020-03-12 DIAGNOSIS — M4804 Spinal stenosis, thoracic region: Secondary | ICD-10-CM | POA: Diagnosis not present

## 2020-03-12 DIAGNOSIS — M4714 Other spondylosis with myelopathy, thoracic region: Secondary | ICD-10-CM | POA: Diagnosis not present

## 2020-03-12 DIAGNOSIS — M150 Primary generalized (osteo)arthritis: Secondary | ICD-10-CM | POA: Diagnosis not present

## 2020-03-12 DIAGNOSIS — M48062 Spinal stenosis, lumbar region with neurogenic claudication: Secondary | ICD-10-CM | POA: Diagnosis not present

## 2020-03-12 DIAGNOSIS — Z981 Arthrodesis status: Secondary | ICD-10-CM | POA: Diagnosis not present

## 2020-03-12 DIAGNOSIS — K5900 Constipation, unspecified: Secondary | ICD-10-CM | POA: Diagnosis not present

## 2020-03-12 DIAGNOSIS — M48061 Spinal stenosis, lumbar region without neurogenic claudication: Secondary | ICD-10-CM | POA: Diagnosis not present

## 2020-03-12 DIAGNOSIS — Z4789 Encounter for other orthopedic aftercare: Secondary | ICD-10-CM | POA: Diagnosis not present

## 2020-03-12 DIAGNOSIS — Z87820 Personal history of traumatic brain injury: Secondary | ICD-10-CM | POA: Diagnosis not present

## 2020-03-14 DIAGNOSIS — M5002 Cervical disc disorder with myelopathy, mid-cervical region, unspecified level: Secondary | ICD-10-CM | POA: Diagnosis not present

## 2020-03-14 DIAGNOSIS — M150 Primary generalized (osteo)arthritis: Secondary | ICD-10-CM | POA: Diagnosis not present

## 2020-03-14 DIAGNOSIS — M48061 Spinal stenosis, lumbar region without neurogenic claudication: Secondary | ICD-10-CM | POA: Diagnosis not present

## 2020-03-14 DIAGNOSIS — Z4789 Encounter for other orthopedic aftercare: Secondary | ICD-10-CM | POA: Diagnosis not present

## 2020-03-14 DIAGNOSIS — K5900 Constipation, unspecified: Secondary | ICD-10-CM | POA: Diagnosis not present

## 2020-03-14 DIAGNOSIS — Z981 Arthrodesis status: Secondary | ICD-10-CM | POA: Diagnosis not present

## 2020-03-14 DIAGNOSIS — Z87820 Personal history of traumatic brain injury: Secondary | ICD-10-CM | POA: Diagnosis not present

## 2020-03-14 DIAGNOSIS — M4804 Spinal stenosis, thoracic region: Secondary | ICD-10-CM | POA: Diagnosis not present

## 2020-03-14 DIAGNOSIS — I10 Essential (primary) hypertension: Secondary | ICD-10-CM | POA: Diagnosis not present

## 2020-03-14 DIAGNOSIS — G8929 Other chronic pain: Secondary | ICD-10-CM | POA: Diagnosis not present

## 2020-03-14 DIAGNOSIS — M4714 Other spondylosis with myelopathy, thoracic region: Secondary | ICD-10-CM | POA: Diagnosis not present

## 2020-03-14 DIAGNOSIS — M48062 Spinal stenosis, lumbar region with neurogenic claudication: Secondary | ICD-10-CM | POA: Diagnosis not present

## 2020-03-15 ENCOUNTER — Ambulatory Visit (INDEPENDENT_AMBULATORY_CARE_PROVIDER_SITE_OTHER): Payer: Medicare Other | Admitting: Family Medicine

## 2020-03-15 ENCOUNTER — Other Ambulatory Visit: Payer: Self-pay

## 2020-03-15 ENCOUNTER — Encounter: Payer: Self-pay | Admitting: Family Medicine

## 2020-03-15 VITALS — BP 162/82 | HR 69 | Temp 97.7°F | Wt 362.6 lb

## 2020-03-15 DIAGNOSIS — Z23 Encounter for immunization: Secondary | ICD-10-CM

## 2020-03-15 DIAGNOSIS — G959 Disease of spinal cord, unspecified: Secondary | ICD-10-CM

## 2020-03-15 DIAGNOSIS — Z9889 Other specified postprocedural states: Secondary | ICD-10-CM

## 2020-03-15 DIAGNOSIS — G894 Chronic pain syndrome: Secondary | ICD-10-CM | POA: Diagnosis not present
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
## 2020-03-15 NOTE — Patient Instructions (Signed)
Sound like peg leg I think the rest of them are all in the first the rest of more I will now there is one at the end bronchitis positive

## 2020-03-16 DIAGNOSIS — G8929 Other chronic pain: Secondary | ICD-10-CM | POA: Diagnosis not present

## 2020-03-16 DIAGNOSIS — M4714 Other spondylosis with myelopathy, thoracic region: Secondary | ICD-10-CM | POA: Diagnosis not present

## 2020-03-16 DIAGNOSIS — K5900 Constipation, unspecified: Secondary | ICD-10-CM | POA: Diagnosis not present

## 2020-03-16 DIAGNOSIS — M5002 Cervical disc disorder with myelopathy, mid-cervical region, unspecified level: Secondary | ICD-10-CM | POA: Diagnosis not present

## 2020-03-16 DIAGNOSIS — I10 Essential (primary) hypertension: Secondary | ICD-10-CM | POA: Diagnosis not present

## 2020-03-16 DIAGNOSIS — Z4789 Encounter for other orthopedic aftercare: Secondary | ICD-10-CM | POA: Diagnosis not present

## 2020-03-16 DIAGNOSIS — Z981 Arthrodesis status: Secondary | ICD-10-CM | POA: Diagnosis not present

## 2020-03-16 DIAGNOSIS — M150 Primary generalized (osteo)arthritis: Secondary | ICD-10-CM | POA: Diagnosis not present

## 2020-03-16 DIAGNOSIS — M48061 Spinal stenosis, lumbar region without neurogenic claudication: Secondary | ICD-10-CM | POA: Diagnosis not present

## 2020-03-16 DIAGNOSIS — M4804 Spinal stenosis, thoracic region: Secondary | ICD-10-CM | POA: Diagnosis not present

## 2020-03-16 DIAGNOSIS — M48062 Spinal stenosis, lumbar region with neurogenic claudication: Secondary | ICD-10-CM | POA: Diagnosis not present

## 2020-03-16 DIAGNOSIS — Z87820 Personal history of traumatic brain injury: Secondary | ICD-10-CM | POA: Diagnosis not present

## 2020-03-18 DIAGNOSIS — M5002 Cervical disc disorder with myelopathy, mid-cervical region, unspecified level: Secondary | ICD-10-CM | POA: Diagnosis not present

## 2020-03-18 DIAGNOSIS — M4804 Spinal stenosis, thoracic region: Secondary | ICD-10-CM | POA: Diagnosis not present

## 2020-03-18 DIAGNOSIS — Z981 Arthrodesis status: Secondary | ICD-10-CM | POA: Diagnosis not present

## 2020-03-18 DIAGNOSIS — I10 Essential (primary) hypertension: Secondary | ICD-10-CM | POA: Diagnosis not present

## 2020-03-18 DIAGNOSIS — M48061 Spinal stenosis, lumbar region without neurogenic claudication: Secondary | ICD-10-CM | POA: Diagnosis not present

## 2020-03-18 DIAGNOSIS — G8929 Other chronic pain: Secondary | ICD-10-CM | POA: Diagnosis not present

## 2020-03-18 DIAGNOSIS — Z4789 Encounter for other orthopedic aftercare: Secondary | ICD-10-CM | POA: Diagnosis not present

## 2020-03-18 DIAGNOSIS — K5900 Constipation, unspecified: Secondary | ICD-10-CM | POA: Diagnosis not present

## 2020-03-18 DIAGNOSIS — M4714 Other spondylosis with myelopathy, thoracic region: Secondary | ICD-10-CM | POA: Diagnosis not present

## 2020-03-18 DIAGNOSIS — M48062 Spinal stenosis, lumbar region with neurogenic claudication: Secondary | ICD-10-CM | POA: Diagnosis not present

## 2020-03-18 DIAGNOSIS — Z87820 Personal history of traumatic brain injury: Secondary | ICD-10-CM | POA: Diagnosis not present

## 2020-03-18 DIAGNOSIS — M150 Primary generalized (osteo)arthritis: Secondary | ICD-10-CM | POA: Diagnosis not present

## 2020-03-24 DIAGNOSIS — G8929 Other chronic pain: Secondary | ICD-10-CM | POA: Diagnosis not present

## 2020-03-24 DIAGNOSIS — M48062 Spinal stenosis, lumbar region with neurogenic claudication: Secondary | ICD-10-CM | POA: Diagnosis not present

## 2020-03-24 DIAGNOSIS — Z4789 Encounter for other orthopedic aftercare: Secondary | ICD-10-CM | POA: Diagnosis not present

## 2020-03-24 DIAGNOSIS — I10 Essential (primary) hypertension: Secondary | ICD-10-CM | POA: Diagnosis not present

## 2020-03-24 DIAGNOSIS — K5900 Constipation, unspecified: Secondary | ICD-10-CM | POA: Diagnosis not present

## 2020-03-24 DIAGNOSIS — M4804 Spinal stenosis, thoracic region: Secondary | ICD-10-CM | POA: Diagnosis not present

## 2020-03-24 DIAGNOSIS — M150 Primary generalized (osteo)arthritis: Secondary | ICD-10-CM | POA: Diagnosis not present

## 2020-03-24 DIAGNOSIS — M48061 Spinal stenosis, lumbar region without neurogenic claudication: Secondary | ICD-10-CM | POA: Diagnosis not present

## 2020-03-24 DIAGNOSIS — Z87820 Personal history of traumatic brain injury: Secondary | ICD-10-CM | POA: Diagnosis not present

## 2020-03-24 DIAGNOSIS — M5002 Cervical disc disorder with myelopathy, mid-cervical region, unspecified level: Secondary | ICD-10-CM | POA: Diagnosis not present

## 2020-03-24 DIAGNOSIS — Z981 Arthrodesis status: Secondary | ICD-10-CM | POA: Diagnosis not present

## 2020-03-24 DIAGNOSIS — M4714 Other spondylosis with myelopathy, thoracic region: Secondary | ICD-10-CM | POA: Diagnosis not present

## 2020-03-28 DIAGNOSIS — Z981 Arthrodesis status: Secondary | ICD-10-CM | POA: Diagnosis not present

## 2020-03-28 DIAGNOSIS — M4714 Other spondylosis with myelopathy, thoracic region: Secondary | ICD-10-CM | POA: Diagnosis not present

## 2020-03-28 DIAGNOSIS — M48061 Spinal stenosis, lumbar region without neurogenic claudication: Secondary | ICD-10-CM | POA: Diagnosis not present

## 2020-03-28 DIAGNOSIS — M48062 Spinal stenosis, lumbar region with neurogenic claudication: Secondary | ICD-10-CM | POA: Diagnosis not present

## 2020-03-28 DIAGNOSIS — Z87820 Personal history of traumatic brain injury: Secondary | ICD-10-CM | POA: Diagnosis not present

## 2020-03-28 DIAGNOSIS — K5900 Constipation, unspecified: Secondary | ICD-10-CM | POA: Diagnosis not present

## 2020-03-28 DIAGNOSIS — M4804 Spinal stenosis, thoracic region: Secondary | ICD-10-CM | POA: Diagnosis not present

## 2020-03-28 DIAGNOSIS — M5002 Cervical disc disorder with myelopathy, mid-cervical region, unspecified level: Secondary | ICD-10-CM | POA: Diagnosis not present

## 2020-03-28 DIAGNOSIS — G8929 Other chronic pain: Secondary | ICD-10-CM | POA: Diagnosis not present

## 2020-03-28 DIAGNOSIS — I10 Essential (primary) hypertension: Secondary | ICD-10-CM | POA: Diagnosis not present

## 2020-03-28 DIAGNOSIS — M150 Primary generalized (osteo)arthritis: Secondary | ICD-10-CM | POA: Diagnosis not present

## 2020-03-28 DIAGNOSIS — Z4789 Encounter for other orthopedic aftercare: Secondary | ICD-10-CM | POA: Diagnosis not present

## 2020-03-31 DIAGNOSIS — I10 Essential (primary) hypertension: Secondary | ICD-10-CM | POA: Diagnosis not present

## 2020-03-31 DIAGNOSIS — M48061 Spinal stenosis, lumbar region without neurogenic claudication: Secondary | ICD-10-CM | POA: Diagnosis not present

## 2020-03-31 DIAGNOSIS — M4804 Spinal stenosis, thoracic region: Secondary | ICD-10-CM | POA: Diagnosis not present

## 2020-03-31 DIAGNOSIS — K5900 Constipation, unspecified: Secondary | ICD-10-CM | POA: Diagnosis not present

## 2020-03-31 DIAGNOSIS — M150 Primary generalized (osteo)arthritis: Secondary | ICD-10-CM | POA: Diagnosis not present

## 2020-03-31 DIAGNOSIS — Z981 Arthrodesis status: Secondary | ICD-10-CM | POA: Diagnosis not present

## 2020-03-31 DIAGNOSIS — Z4789 Encounter for other orthopedic aftercare: Secondary | ICD-10-CM | POA: Diagnosis not present

## 2020-03-31 DIAGNOSIS — M5002 Cervical disc disorder with myelopathy, mid-cervical region, unspecified level: Secondary | ICD-10-CM | POA: Diagnosis not present

## 2020-03-31 DIAGNOSIS — M48062 Spinal stenosis, lumbar region with neurogenic claudication: Secondary | ICD-10-CM | POA: Diagnosis not present

## 2020-03-31 DIAGNOSIS — M4714 Other spondylosis with myelopathy, thoracic region: Secondary | ICD-10-CM | POA: Diagnosis not present

## 2020-03-31 DIAGNOSIS — Z87820 Personal history of traumatic brain injury: Secondary | ICD-10-CM | POA: Diagnosis not present

## 2020-03-31 DIAGNOSIS — G8929 Other chronic pain: Secondary | ICD-10-CM | POA: Diagnosis not present

## 2020-04-04 DIAGNOSIS — Z87820 Personal history of traumatic brain injury: Secondary | ICD-10-CM | POA: Diagnosis not present

## 2020-04-04 DIAGNOSIS — K5900 Constipation, unspecified: Secondary | ICD-10-CM | POA: Diagnosis not present

## 2020-04-04 DIAGNOSIS — M48061 Spinal stenosis, lumbar region without neurogenic claudication: Secondary | ICD-10-CM | POA: Diagnosis not present

## 2020-04-04 DIAGNOSIS — M150 Primary generalized (osteo)arthritis: Secondary | ICD-10-CM | POA: Diagnosis not present

## 2020-04-04 DIAGNOSIS — Z981 Arthrodesis status: Secondary | ICD-10-CM | POA: Diagnosis not present

## 2020-04-04 DIAGNOSIS — M4804 Spinal stenosis, thoracic region: Secondary | ICD-10-CM | POA: Diagnosis not present

## 2020-04-04 DIAGNOSIS — Z4789 Encounter for other orthopedic aftercare: Secondary | ICD-10-CM | POA: Diagnosis not present

## 2020-04-04 DIAGNOSIS — M5002 Cervical disc disorder with myelopathy, mid-cervical region, unspecified level: Secondary | ICD-10-CM | POA: Diagnosis not present

## 2020-04-04 DIAGNOSIS — G8929 Other chronic pain: Secondary | ICD-10-CM | POA: Diagnosis not present

## 2020-04-04 DIAGNOSIS — M48062 Spinal stenosis, lumbar region with neurogenic claudication: Secondary | ICD-10-CM | POA: Diagnosis not present

## 2020-04-04 DIAGNOSIS — M4714 Other spondylosis with myelopathy, thoracic region: Secondary | ICD-10-CM | POA: Diagnosis not present

## 2020-04-04 DIAGNOSIS — I10 Essential (primary) hypertension: Secondary | ICD-10-CM | POA: Diagnosis not present

## 2020-04-06 DIAGNOSIS — Z87820 Personal history of traumatic brain injury: Secondary | ICD-10-CM | POA: Diagnosis not present

## 2020-04-06 DIAGNOSIS — M4804 Spinal stenosis, thoracic region: Secondary | ICD-10-CM | POA: Diagnosis not present

## 2020-04-06 DIAGNOSIS — M4714 Other spondylosis with myelopathy, thoracic region: Secondary | ICD-10-CM | POA: Diagnosis not present

## 2020-04-06 DIAGNOSIS — M5002 Cervical disc disorder with myelopathy, mid-cervical region, unspecified level: Secondary | ICD-10-CM | POA: Diagnosis not present

## 2020-04-06 DIAGNOSIS — Z4789 Encounter for other orthopedic aftercare: Secondary | ICD-10-CM | POA: Diagnosis not present

## 2020-04-06 DIAGNOSIS — M48061 Spinal stenosis, lumbar region without neurogenic claudication: Secondary | ICD-10-CM | POA: Diagnosis not present

## 2020-04-06 DIAGNOSIS — G8929 Other chronic pain: Secondary | ICD-10-CM | POA: Diagnosis not present

## 2020-04-06 DIAGNOSIS — K5900 Constipation, unspecified: Secondary | ICD-10-CM | POA: Diagnosis not present

## 2020-04-06 DIAGNOSIS — Z981 Arthrodesis status: Secondary | ICD-10-CM | POA: Diagnosis not present

## 2020-04-06 DIAGNOSIS — M48062 Spinal stenosis, lumbar region with neurogenic claudication: Secondary | ICD-10-CM | POA: Diagnosis not present

## 2020-04-06 DIAGNOSIS — I10 Essential (primary) hypertension: Secondary | ICD-10-CM | POA: Diagnosis not present

## 2020-04-06 DIAGNOSIS — M150 Primary generalized (osteo)arthritis: Secondary | ICD-10-CM | POA: Diagnosis not present

## 2020-04-08 DIAGNOSIS — Z4789 Encounter for other orthopedic aftercare: Secondary | ICD-10-CM | POA: Diagnosis not present

## 2020-04-08 DIAGNOSIS — M48062 Spinal stenosis, lumbar region with neurogenic claudication: Secondary | ICD-10-CM | POA: Diagnosis not present

## 2020-04-08 DIAGNOSIS — M150 Primary generalized (osteo)arthritis: Secondary | ICD-10-CM | POA: Diagnosis not present

## 2020-04-08 DIAGNOSIS — M48061 Spinal stenosis, lumbar region without neurogenic claudication: Secondary | ICD-10-CM | POA: Diagnosis not present

## 2020-04-08 DIAGNOSIS — M5002 Cervical disc disorder with myelopathy, mid-cervical region, unspecified level: Secondary | ICD-10-CM | POA: Diagnosis not present

## 2020-04-08 DIAGNOSIS — I10 Essential (primary) hypertension: Secondary | ICD-10-CM | POA: Diagnosis not present

## 2020-04-08 DIAGNOSIS — Z87820 Personal history of traumatic brain injury: Secondary | ICD-10-CM | POA: Diagnosis not present

## 2020-04-08 DIAGNOSIS — G8929 Other chronic pain: Secondary | ICD-10-CM | POA: Diagnosis not present

## 2020-04-08 DIAGNOSIS — M4714 Other spondylosis with myelopathy, thoracic region: Secondary | ICD-10-CM | POA: Diagnosis not present

## 2020-04-08 DIAGNOSIS — K5900 Constipation, unspecified: Secondary | ICD-10-CM | POA: Diagnosis not present

## 2020-04-08 DIAGNOSIS — Z981 Arthrodesis status: Secondary | ICD-10-CM | POA: Diagnosis not present

## 2020-04-08 DIAGNOSIS — M4804 Spinal stenosis, thoracic region: Secondary | ICD-10-CM | POA: Diagnosis not present

## 2020-04-11 DIAGNOSIS — Z4789 Encounter for other orthopedic aftercare: Secondary | ICD-10-CM | POA: Diagnosis not present

## 2020-04-11 DIAGNOSIS — M150 Primary generalized (osteo)arthritis: Secondary | ICD-10-CM | POA: Diagnosis not present

## 2020-04-11 DIAGNOSIS — M4714 Other spondylosis with myelopathy, thoracic region: Secondary | ICD-10-CM | POA: Diagnosis not present

## 2020-04-11 DIAGNOSIS — M5002 Cervical disc disorder with myelopathy, mid-cervical region, unspecified level: Secondary | ICD-10-CM | POA: Diagnosis not present

## 2020-04-11 DIAGNOSIS — M48061 Spinal stenosis, lumbar region without neurogenic claudication: Secondary | ICD-10-CM | POA: Diagnosis not present

## 2020-04-11 DIAGNOSIS — K5900 Constipation, unspecified: Secondary | ICD-10-CM | POA: Diagnosis not present

## 2020-04-11 DIAGNOSIS — G8929 Other chronic pain: Secondary | ICD-10-CM | POA: Diagnosis not present

## 2020-04-11 DIAGNOSIS — Z87820 Personal history of traumatic brain injury: Secondary | ICD-10-CM | POA: Diagnosis not present

## 2020-04-11 DIAGNOSIS — M4804 Spinal stenosis, thoracic region: Secondary | ICD-10-CM | POA: Diagnosis not present

## 2020-04-11 DIAGNOSIS — M48062 Spinal stenosis, lumbar region with neurogenic claudication: Secondary | ICD-10-CM | POA: Diagnosis not present

## 2020-04-11 DIAGNOSIS — I10 Essential (primary) hypertension: Secondary | ICD-10-CM | POA: Diagnosis not present

## 2020-04-11 DIAGNOSIS — Z981 Arthrodesis status: Secondary | ICD-10-CM | POA: Diagnosis not present

## 2020-04-13 DIAGNOSIS — Z87820 Personal history of traumatic brain injury: Secondary | ICD-10-CM | POA: Diagnosis not present

## 2020-04-13 DIAGNOSIS — I10 Essential (primary) hypertension: Secondary | ICD-10-CM | POA: Diagnosis not present

## 2020-04-13 DIAGNOSIS — M4714 Other spondylosis with myelopathy, thoracic region: Secondary | ICD-10-CM | POA: Diagnosis not present

## 2020-04-13 DIAGNOSIS — K5900 Constipation, unspecified: Secondary | ICD-10-CM | POA: Diagnosis not present

## 2020-04-13 DIAGNOSIS — M5002 Cervical disc disorder with myelopathy, mid-cervical region, unspecified level: Secondary | ICD-10-CM | POA: Diagnosis not present

## 2020-04-13 DIAGNOSIS — Z981 Arthrodesis status: Secondary | ICD-10-CM | POA: Diagnosis not present

## 2020-04-13 DIAGNOSIS — M48062 Spinal stenosis, lumbar region with neurogenic claudication: Secondary | ICD-10-CM | POA: Diagnosis not present

## 2020-04-13 DIAGNOSIS — M4804 Spinal stenosis, thoracic region: Secondary | ICD-10-CM | POA: Diagnosis not present

## 2020-04-13 DIAGNOSIS — M48061 Spinal stenosis, lumbar region without neurogenic claudication: Secondary | ICD-10-CM | POA: Diagnosis not present

## 2020-04-13 DIAGNOSIS — M150 Primary generalized (osteo)arthritis: Secondary | ICD-10-CM | POA: Diagnosis not present

## 2020-04-13 DIAGNOSIS — G8929 Other chronic pain: Secondary | ICD-10-CM | POA: Diagnosis not present

## 2020-04-13 DIAGNOSIS — Z4789 Encounter for other orthopedic aftercare: Secondary | ICD-10-CM | POA: Diagnosis not present

## 2020-04-15 DIAGNOSIS — G8929 Other chronic pain: Secondary | ICD-10-CM | POA: Diagnosis not present

## 2020-04-15 DIAGNOSIS — M48062 Spinal stenosis, lumbar region with neurogenic claudication: Secondary | ICD-10-CM | POA: Diagnosis not present

## 2020-04-15 DIAGNOSIS — M4804 Spinal stenosis, thoracic region: Secondary | ICD-10-CM | POA: Diagnosis not present

## 2020-04-15 DIAGNOSIS — M48061 Spinal stenosis, lumbar region without neurogenic claudication: Secondary | ICD-10-CM | POA: Diagnosis not present

## 2020-04-15 DIAGNOSIS — M4714 Other spondylosis with myelopathy, thoracic region: Secondary | ICD-10-CM | POA: Diagnosis not present

## 2020-04-15 DIAGNOSIS — M150 Primary generalized (osteo)arthritis: Secondary | ICD-10-CM | POA: Diagnosis not present

## 2020-04-15 DIAGNOSIS — K5900 Constipation, unspecified: Secondary | ICD-10-CM | POA: Diagnosis not present

## 2020-04-15 DIAGNOSIS — I10 Essential (primary) hypertension: Secondary | ICD-10-CM | POA: Diagnosis not present

## 2020-04-15 DIAGNOSIS — M5002 Cervical disc disorder with myelopathy, mid-cervical region, unspecified level: Secondary | ICD-10-CM | POA: Diagnosis not present

## 2020-04-15 DIAGNOSIS — Z4789 Encounter for other orthopedic aftercare: Secondary | ICD-10-CM | POA: Diagnosis not present

## 2020-04-15 DIAGNOSIS — Z981 Arthrodesis status: Secondary | ICD-10-CM | POA: Diagnosis not present

## 2020-04-15 DIAGNOSIS — Z87820 Personal history of traumatic brain injury: Secondary | ICD-10-CM | POA: Diagnosis not present

## 2020-04-18 DIAGNOSIS — G8929 Other chronic pain: Secondary | ICD-10-CM | POA: Diagnosis not present

## 2020-04-18 DIAGNOSIS — M4714 Other spondylosis with myelopathy, thoracic region: Secondary | ICD-10-CM | POA: Diagnosis not present

## 2020-04-18 DIAGNOSIS — Z87820 Personal history of traumatic brain injury: Secondary | ICD-10-CM | POA: Diagnosis not present

## 2020-04-18 DIAGNOSIS — K5900 Constipation, unspecified: Secondary | ICD-10-CM | POA: Diagnosis not present

## 2020-04-18 DIAGNOSIS — I10 Essential (primary) hypertension: Secondary | ICD-10-CM | POA: Diagnosis not present

## 2020-04-18 DIAGNOSIS — Z4789 Encounter for other orthopedic aftercare: Secondary | ICD-10-CM | POA: Diagnosis not present

## 2020-04-18 DIAGNOSIS — M150 Primary generalized (osteo)arthritis: Secondary | ICD-10-CM | POA: Diagnosis not present

## 2020-04-18 DIAGNOSIS — M48061 Spinal stenosis, lumbar region without neurogenic claudication: Secondary | ICD-10-CM | POA: Diagnosis not present

## 2020-04-18 DIAGNOSIS — M48062 Spinal stenosis, lumbar region with neurogenic claudication: Secondary | ICD-10-CM | POA: Diagnosis not present

## 2020-04-18 DIAGNOSIS — Z981 Arthrodesis status: Secondary | ICD-10-CM | POA: Diagnosis not present

## 2020-04-18 DIAGNOSIS — M5002 Cervical disc disorder with myelopathy, mid-cervical region, unspecified level: Secondary | ICD-10-CM | POA: Diagnosis not present

## 2020-04-18 DIAGNOSIS — M4804 Spinal stenosis, thoracic region: Secondary | ICD-10-CM | POA: Diagnosis not present

## 2020-04-20 DIAGNOSIS — G8929 Other chronic pain: Secondary | ICD-10-CM | POA: Diagnosis not present

## 2020-04-20 DIAGNOSIS — M150 Primary generalized (osteo)arthritis: Secondary | ICD-10-CM | POA: Diagnosis not present

## 2020-04-20 DIAGNOSIS — M4714 Other spondylosis with myelopathy, thoracic region: Secondary | ICD-10-CM | POA: Diagnosis not present

## 2020-04-20 DIAGNOSIS — I10 Essential (primary) hypertension: Secondary | ICD-10-CM | POA: Diagnosis not present

## 2020-04-20 DIAGNOSIS — M48062 Spinal stenosis, lumbar region with neurogenic claudication: Secondary | ICD-10-CM | POA: Diagnosis not present

## 2020-04-20 DIAGNOSIS — M4804 Spinal stenosis, thoracic region: Secondary | ICD-10-CM | POA: Diagnosis not present

## 2020-04-20 DIAGNOSIS — Z981 Arthrodesis status: Secondary | ICD-10-CM | POA: Diagnosis not present

## 2020-04-20 DIAGNOSIS — K5900 Constipation, unspecified: Secondary | ICD-10-CM | POA: Diagnosis not present

## 2020-04-20 DIAGNOSIS — M5002 Cervical disc disorder with myelopathy, mid-cervical region, unspecified level: Secondary | ICD-10-CM | POA: Diagnosis not present

## 2020-04-20 DIAGNOSIS — M48061 Spinal stenosis, lumbar region without neurogenic claudication: Secondary | ICD-10-CM | POA: Diagnosis not present

## 2020-04-20 DIAGNOSIS — Z4789 Encounter for other orthopedic aftercare: Secondary | ICD-10-CM | POA: Diagnosis not present

## 2020-04-20 DIAGNOSIS — Z87820 Personal history of traumatic brain injury: Secondary | ICD-10-CM | POA: Diagnosis not present

## 2020-04-22 DIAGNOSIS — Z87820 Personal history of traumatic brain injury: Secondary | ICD-10-CM | POA: Diagnosis not present

## 2020-04-22 DIAGNOSIS — Z981 Arthrodesis status: Secondary | ICD-10-CM | POA: Diagnosis not present

## 2020-04-22 DIAGNOSIS — M4804 Spinal stenosis, thoracic region: Secondary | ICD-10-CM | POA: Diagnosis not present

## 2020-04-22 DIAGNOSIS — M4714 Other spondylosis with myelopathy, thoracic region: Secondary | ICD-10-CM | POA: Diagnosis not present

## 2020-04-22 DIAGNOSIS — M48062 Spinal stenosis, lumbar region with neurogenic claudication: Secondary | ICD-10-CM | POA: Diagnosis not present

## 2020-04-22 DIAGNOSIS — M150 Primary generalized (osteo)arthritis: Secondary | ICD-10-CM | POA: Diagnosis not present

## 2020-04-22 DIAGNOSIS — K5900 Constipation, unspecified: Secondary | ICD-10-CM | POA: Diagnosis not present

## 2020-04-22 DIAGNOSIS — Z4789 Encounter for other orthopedic aftercare: Secondary | ICD-10-CM | POA: Diagnosis not present

## 2020-04-22 DIAGNOSIS — G8929 Other chronic pain: Secondary | ICD-10-CM | POA: Diagnosis not present

## 2020-04-22 DIAGNOSIS — I10 Essential (primary) hypertension: Secondary | ICD-10-CM | POA: Diagnosis not present

## 2020-04-22 DIAGNOSIS — M5002 Cervical disc disorder with myelopathy, mid-cervical region, unspecified level: Secondary | ICD-10-CM | POA: Diagnosis not present

## 2020-04-22 DIAGNOSIS — M48061 Spinal stenosis, lumbar region without neurogenic claudication: Secondary | ICD-10-CM | POA: Diagnosis not present

## 2020-04-26 DIAGNOSIS — M4714 Other spondylosis with myelopathy, thoracic region: Secondary | ICD-10-CM | POA: Diagnosis not present

## 2020-04-26 DIAGNOSIS — Z87820 Personal history of traumatic brain injury: Secondary | ICD-10-CM | POA: Diagnosis not present

## 2020-04-26 DIAGNOSIS — M150 Primary generalized (osteo)arthritis: Secondary | ICD-10-CM | POA: Diagnosis not present

## 2020-04-26 DIAGNOSIS — M48061 Spinal stenosis, lumbar region without neurogenic claudication: Secondary | ICD-10-CM | POA: Diagnosis not present

## 2020-04-26 DIAGNOSIS — Z4789 Encounter for other orthopedic aftercare: Secondary | ICD-10-CM | POA: Diagnosis not present

## 2020-04-26 DIAGNOSIS — K5900 Constipation, unspecified: Secondary | ICD-10-CM | POA: Diagnosis not present

## 2020-04-26 DIAGNOSIS — M48062 Spinal stenosis, lumbar region with neurogenic claudication: Secondary | ICD-10-CM | POA: Diagnosis not present

## 2020-04-26 DIAGNOSIS — M4804 Spinal stenosis, thoracic region: Secondary | ICD-10-CM | POA: Diagnosis not present

## 2020-04-26 DIAGNOSIS — M5002 Cervical disc disorder with myelopathy, mid-cervical region, unspecified level: Secondary | ICD-10-CM | POA: Diagnosis not present

## 2020-04-26 DIAGNOSIS — G8929 Other chronic pain: Secondary | ICD-10-CM | POA: Diagnosis not present

## 2020-04-26 DIAGNOSIS — Z981 Arthrodesis status: Secondary | ICD-10-CM | POA: Diagnosis not present

## 2020-04-26 DIAGNOSIS — I10 Essential (primary) hypertension: Secondary | ICD-10-CM | POA: Diagnosis not present

## 2020-04-30 DIAGNOSIS — I10 Essential (primary) hypertension: Secondary | ICD-10-CM | POA: Diagnosis not present

## 2020-04-30 DIAGNOSIS — M48061 Spinal stenosis, lumbar region without neurogenic claudication: Secondary | ICD-10-CM | POA: Diagnosis not present

## 2020-04-30 DIAGNOSIS — M5002 Cervical disc disorder with myelopathy, mid-cervical region, unspecified level: Secondary | ICD-10-CM | POA: Diagnosis not present

## 2020-04-30 DIAGNOSIS — Z4789 Encounter for other orthopedic aftercare: Secondary | ICD-10-CM | POA: Diagnosis not present

## 2020-04-30 DIAGNOSIS — G8929 Other chronic pain: Secondary | ICD-10-CM | POA: Diagnosis not present

## 2020-04-30 DIAGNOSIS — M4714 Other spondylosis with myelopathy, thoracic region: Secondary | ICD-10-CM | POA: Diagnosis not present

## 2020-04-30 DIAGNOSIS — M4804 Spinal stenosis, thoracic region: Secondary | ICD-10-CM | POA: Diagnosis not present

## 2020-04-30 DIAGNOSIS — Z87820 Personal history of traumatic brain injury: Secondary | ICD-10-CM | POA: Diagnosis not present

## 2020-04-30 DIAGNOSIS — Z981 Arthrodesis status: Secondary | ICD-10-CM | POA: Diagnosis not present

## 2020-04-30 DIAGNOSIS — M48062 Spinal stenosis, lumbar region with neurogenic claudication: Secondary | ICD-10-CM | POA: Diagnosis not present

## 2020-04-30 DIAGNOSIS — M150 Primary generalized (osteo)arthritis: Secondary | ICD-10-CM | POA: Diagnosis not present

## 2020-04-30 DIAGNOSIS — K5900 Constipation, unspecified: Secondary | ICD-10-CM | POA: Diagnosis not present

## 2020-05-03 DIAGNOSIS — M48062 Spinal stenosis, lumbar region with neurogenic claudication: Secondary | ICD-10-CM | POA: Diagnosis not present

## 2020-05-03 DIAGNOSIS — G8929 Other chronic pain: Secondary | ICD-10-CM | POA: Diagnosis not present

## 2020-05-03 DIAGNOSIS — M150 Primary generalized (osteo)arthritis: Secondary | ICD-10-CM | POA: Diagnosis not present

## 2020-05-03 DIAGNOSIS — M4804 Spinal stenosis, thoracic region: Secondary | ICD-10-CM | POA: Diagnosis not present

## 2020-05-03 DIAGNOSIS — Z981 Arthrodesis status: Secondary | ICD-10-CM | POA: Diagnosis not present

## 2020-05-03 DIAGNOSIS — Z4789 Encounter for other orthopedic aftercare: Secondary | ICD-10-CM | POA: Diagnosis not present

## 2020-05-03 DIAGNOSIS — M48061 Spinal stenosis, lumbar region without neurogenic claudication: Secondary | ICD-10-CM | POA: Diagnosis not present

## 2020-05-03 DIAGNOSIS — Z87820 Personal history of traumatic brain injury: Secondary | ICD-10-CM | POA: Diagnosis not present

## 2020-05-03 DIAGNOSIS — K5900 Constipation, unspecified: Secondary | ICD-10-CM | POA: Diagnosis not present

## 2020-05-03 DIAGNOSIS — M5002 Cervical disc disorder with myelopathy, mid-cervical region, unspecified level: Secondary | ICD-10-CM | POA: Diagnosis not present

## 2020-05-03 DIAGNOSIS — I10 Essential (primary) hypertension: Secondary | ICD-10-CM | POA: Diagnosis not present

## 2020-05-03 DIAGNOSIS — M4714 Other spondylosis with myelopathy, thoracic region: Secondary | ICD-10-CM | POA: Diagnosis not present

## 2020-05-04 DIAGNOSIS — M4804 Spinal stenosis, thoracic region: Secondary | ICD-10-CM | POA: Diagnosis not present

## 2020-05-06 DIAGNOSIS — Z4789 Encounter for other orthopedic aftercare: Secondary | ICD-10-CM | POA: Diagnosis not present

## 2020-05-06 DIAGNOSIS — M4714 Other spondylosis with myelopathy, thoracic region: Secondary | ICD-10-CM | POA: Diagnosis not present

## 2020-05-06 DIAGNOSIS — G8929 Other chronic pain: Secondary | ICD-10-CM | POA: Diagnosis not present

## 2020-05-06 DIAGNOSIS — M48061 Spinal stenosis, lumbar region without neurogenic claudication: Secondary | ICD-10-CM | POA: Diagnosis not present

## 2020-05-06 DIAGNOSIS — M5002 Cervical disc disorder with myelopathy, mid-cervical region, unspecified level: Secondary | ICD-10-CM | POA: Diagnosis not present

## 2020-05-06 DIAGNOSIS — K5900 Constipation, unspecified: Secondary | ICD-10-CM | POA: Diagnosis not present

## 2020-05-06 DIAGNOSIS — Z981 Arthrodesis status: Secondary | ICD-10-CM | POA: Diagnosis not present

## 2020-05-06 DIAGNOSIS — I10 Essential (primary) hypertension: Secondary | ICD-10-CM | POA: Diagnosis not present

## 2020-05-06 DIAGNOSIS — M150 Primary generalized (osteo)arthritis: Secondary | ICD-10-CM | POA: Diagnosis not present

## 2020-05-06 DIAGNOSIS — M48062 Spinal stenosis, lumbar region with neurogenic claudication: Secondary | ICD-10-CM | POA: Diagnosis not present

## 2020-05-06 DIAGNOSIS — M4804 Spinal stenosis, thoracic region: Secondary | ICD-10-CM | POA: Diagnosis not present

## 2020-05-06 DIAGNOSIS — Z87820 Personal history of traumatic brain injury: Secondary | ICD-10-CM | POA: Diagnosis not present

## 2020-05-11 DIAGNOSIS — Z4789 Encounter for other orthopedic aftercare: Secondary | ICD-10-CM | POA: Diagnosis not present

## 2020-05-11 DIAGNOSIS — M4714 Other spondylosis with myelopathy, thoracic region: Secondary | ICD-10-CM | POA: Diagnosis not present

## 2020-05-11 DIAGNOSIS — I10 Essential (primary) hypertension: Secondary | ICD-10-CM | POA: Diagnosis not present

## 2020-05-11 DIAGNOSIS — Z87820 Personal history of traumatic brain injury: Secondary | ICD-10-CM | POA: Diagnosis not present

## 2020-05-11 DIAGNOSIS — G8929 Other chronic pain: Secondary | ICD-10-CM | POA: Diagnosis not present

## 2020-05-11 DIAGNOSIS — M150 Primary generalized (osteo)arthritis: Secondary | ICD-10-CM | POA: Diagnosis not present

## 2020-05-11 DIAGNOSIS — Z981 Arthrodesis status: Secondary | ICD-10-CM | POA: Diagnosis not present

## 2020-05-11 DIAGNOSIS — K5900 Constipation, unspecified: Secondary | ICD-10-CM | POA: Diagnosis not present

## 2020-05-11 DIAGNOSIS — M5002 Cervical disc disorder with myelopathy, mid-cervical region, unspecified level: Secondary | ICD-10-CM | POA: Diagnosis not present

## 2020-05-11 DIAGNOSIS — M48062 Spinal stenosis, lumbar region with neurogenic claudication: Secondary | ICD-10-CM | POA: Diagnosis not present

## 2020-05-11 DIAGNOSIS — M48061 Spinal stenosis, lumbar region without neurogenic claudication: Secondary | ICD-10-CM | POA: Diagnosis not present

## 2020-05-11 DIAGNOSIS — M4804 Spinal stenosis, thoracic region: Secondary | ICD-10-CM | POA: Diagnosis not present

## 2020-05-14 DIAGNOSIS — M48061 Spinal stenosis, lumbar region without neurogenic claudication: Secondary | ICD-10-CM | POA: Diagnosis not present

## 2020-05-14 DIAGNOSIS — M150 Primary generalized (osteo)arthritis: Secondary | ICD-10-CM | POA: Diagnosis not present

## 2020-05-14 DIAGNOSIS — M5002 Cervical disc disorder with myelopathy, mid-cervical region, unspecified level: Secondary | ICD-10-CM | POA: Diagnosis not present

## 2020-05-14 DIAGNOSIS — G8929 Other chronic pain: Secondary | ICD-10-CM | POA: Diagnosis not present

## 2020-05-14 DIAGNOSIS — K5900 Constipation, unspecified: Secondary | ICD-10-CM | POA: Diagnosis not present

## 2020-05-14 DIAGNOSIS — M48062 Spinal stenosis, lumbar region with neurogenic claudication: Secondary | ICD-10-CM | POA: Diagnosis not present

## 2020-05-14 DIAGNOSIS — Z4789 Encounter for other orthopedic aftercare: Secondary | ICD-10-CM | POA: Diagnosis not present

## 2020-05-14 DIAGNOSIS — M4714 Other spondylosis with myelopathy, thoracic region: Secondary | ICD-10-CM | POA: Diagnosis not present

## 2020-05-14 DIAGNOSIS — Z981 Arthrodesis status: Secondary | ICD-10-CM | POA: Diagnosis not present

## 2020-05-14 DIAGNOSIS — M4804 Spinal stenosis, thoracic region: Secondary | ICD-10-CM | POA: Diagnosis not present

## 2020-05-14 DIAGNOSIS — I10 Essential (primary) hypertension: Secondary | ICD-10-CM | POA: Diagnosis not present

## 2020-05-14 DIAGNOSIS — Z87820 Personal history of traumatic brain injury: Secondary | ICD-10-CM | POA: Diagnosis not present

## 2020-05-15 ENCOUNTER — Other Ambulatory Visit: Payer: Self-pay | Admitting: Family Medicine

## 2020-05-15 DIAGNOSIS — G8929 Other chronic pain: Secondary | ICD-10-CM

## 2020-05-15 DIAGNOSIS — G894 Chronic pain syndrome: Secondary | ICD-10-CM

## 2020-05-16 NOTE — Telephone Encounter (Signed)
[facility] is requesting to fill pt diclofenac and gabapentin. Please advise [facility]

## 2020-05-17 DIAGNOSIS — M150 Primary generalized (osteo)arthritis: Secondary | ICD-10-CM | POA: Diagnosis not present

## 2020-05-17 DIAGNOSIS — Z4789 Encounter for other orthopedic aftercare: Secondary | ICD-10-CM | POA: Diagnosis not present

## 2020-05-17 DIAGNOSIS — Z981 Arthrodesis status: Secondary | ICD-10-CM | POA: Diagnosis not present

## 2020-05-17 DIAGNOSIS — G8929 Other chronic pain: Secondary | ICD-10-CM | POA: Diagnosis not present

## 2020-05-17 DIAGNOSIS — M4714 Other spondylosis with myelopathy, thoracic region: Secondary | ICD-10-CM | POA: Diagnosis not present

## 2020-05-17 DIAGNOSIS — K5900 Constipation, unspecified: Secondary | ICD-10-CM | POA: Diagnosis not present

## 2020-05-17 DIAGNOSIS — M4804 Spinal stenosis, thoracic region: Secondary | ICD-10-CM | POA: Diagnosis not present

## 2020-05-17 DIAGNOSIS — Z87820 Personal history of traumatic brain injury: Secondary | ICD-10-CM | POA: Diagnosis not present

## 2020-05-17 DIAGNOSIS — I10 Essential (primary) hypertension: Secondary | ICD-10-CM | POA: Diagnosis not present

## 2020-05-17 DIAGNOSIS — M48062 Spinal stenosis, lumbar region with neurogenic claudication: Secondary | ICD-10-CM | POA: Diagnosis not present

## 2020-05-17 DIAGNOSIS — M48061 Spinal stenosis, lumbar region without neurogenic claudication: Secondary | ICD-10-CM | POA: Diagnosis not present

## 2020-05-17 DIAGNOSIS — M5002 Cervical disc disorder with myelopathy, mid-cervical region, unspecified level: Secondary | ICD-10-CM | POA: Diagnosis not present

## 2020-05-24 DIAGNOSIS — Z87820 Personal history of traumatic brain injury: Secondary | ICD-10-CM | POA: Diagnosis not present

## 2020-05-24 DIAGNOSIS — G8929 Other chronic pain: Secondary | ICD-10-CM | POA: Diagnosis not present

## 2020-05-24 DIAGNOSIS — K5900 Constipation, unspecified: Secondary | ICD-10-CM | POA: Diagnosis not present

## 2020-05-24 DIAGNOSIS — M150 Primary generalized (osteo)arthritis: Secondary | ICD-10-CM | POA: Diagnosis not present

## 2020-05-24 DIAGNOSIS — I10 Essential (primary) hypertension: Secondary | ICD-10-CM | POA: Diagnosis not present

## 2020-05-24 DIAGNOSIS — M4804 Spinal stenosis, thoracic region: Secondary | ICD-10-CM | POA: Diagnosis not present

## 2020-05-24 DIAGNOSIS — M4714 Other spondylosis with myelopathy, thoracic region: Secondary | ICD-10-CM | POA: Diagnosis not present

## 2020-05-24 DIAGNOSIS — M5002 Cervical disc disorder with myelopathy, mid-cervical region, unspecified level: Secondary | ICD-10-CM | POA: Diagnosis not present

## 2020-05-24 DIAGNOSIS — M48061 Spinal stenosis, lumbar region without neurogenic claudication: Secondary | ICD-10-CM | POA: Diagnosis not present

## 2020-05-24 DIAGNOSIS — M48062 Spinal stenosis, lumbar region with neurogenic claudication: Secondary | ICD-10-CM | POA: Diagnosis not present

## 2020-05-24 DIAGNOSIS — Z981 Arthrodesis status: Secondary | ICD-10-CM | POA: Diagnosis not present

## 2020-05-24 DIAGNOSIS — Z4789 Encounter for other orthopedic aftercare: Secondary | ICD-10-CM | POA: Diagnosis not present

## 2020-05-25 DIAGNOSIS — Z9889 Other specified postprocedural states: Secondary | ICD-10-CM | POA: Diagnosis not present

## 2020-05-25 DIAGNOSIS — I10 Essential (primary) hypertension: Secondary | ICD-10-CM | POA: Diagnosis not present

## 2020-05-27 DIAGNOSIS — M4714 Other spondylosis with myelopathy, thoracic region: Secondary | ICD-10-CM | POA: Diagnosis not present

## 2020-05-27 DIAGNOSIS — M5002 Cervical disc disorder with myelopathy, mid-cervical region, unspecified level: Secondary | ICD-10-CM | POA: Diagnosis not present

## 2020-05-27 DIAGNOSIS — G8929 Other chronic pain: Secondary | ICD-10-CM | POA: Diagnosis not present

## 2020-05-27 DIAGNOSIS — M48062 Spinal stenosis, lumbar region with neurogenic claudication: Secondary | ICD-10-CM | POA: Diagnosis not present

## 2020-05-27 DIAGNOSIS — Z87820 Personal history of traumatic brain injury: Secondary | ICD-10-CM | POA: Diagnosis not present

## 2020-05-27 DIAGNOSIS — K5900 Constipation, unspecified: Secondary | ICD-10-CM | POA: Diagnosis not present

## 2020-05-27 DIAGNOSIS — T8189XD Other complications of procedures, not elsewhere classified, subsequent encounter: Secondary | ICD-10-CM | POA: Diagnosis not present

## 2020-05-27 DIAGNOSIS — I10 Essential (primary) hypertension: Secondary | ICD-10-CM | POA: Diagnosis not present

## 2020-05-27 DIAGNOSIS — M4804 Spinal stenosis, thoracic region: Secondary | ICD-10-CM | POA: Diagnosis not present

## 2020-05-27 DIAGNOSIS — Z981 Arthrodesis status: Secondary | ICD-10-CM | POA: Diagnosis not present

## 2020-05-27 DIAGNOSIS — M150 Primary generalized (osteo)arthritis: Secondary | ICD-10-CM | POA: Diagnosis not present

## 2020-05-31 DIAGNOSIS — M48062 Spinal stenosis, lumbar region with neurogenic claudication: Secondary | ICD-10-CM | POA: Diagnosis not present

## 2020-05-31 DIAGNOSIS — M4714 Other spondylosis with myelopathy, thoracic region: Secondary | ICD-10-CM | POA: Diagnosis not present

## 2020-05-31 DIAGNOSIS — M5002 Cervical disc disorder with myelopathy, mid-cervical region, unspecified level: Secondary | ICD-10-CM | POA: Diagnosis not present

## 2020-05-31 DIAGNOSIS — T8189XD Other complications of procedures, not elsewhere classified, subsequent encounter: Secondary | ICD-10-CM | POA: Diagnosis not present

## 2020-05-31 DIAGNOSIS — G8929 Other chronic pain: Secondary | ICD-10-CM | POA: Diagnosis not present

## 2020-05-31 DIAGNOSIS — Z87820 Personal history of traumatic brain injury: Secondary | ICD-10-CM | POA: Diagnosis not present

## 2020-05-31 DIAGNOSIS — I10 Essential (primary) hypertension: Secondary | ICD-10-CM | POA: Diagnosis not present

## 2020-05-31 DIAGNOSIS — M150 Primary generalized (osteo)arthritis: Secondary | ICD-10-CM | POA: Diagnosis not present

## 2020-05-31 DIAGNOSIS — M4804 Spinal stenosis, thoracic region: Secondary | ICD-10-CM | POA: Diagnosis not present

## 2020-05-31 DIAGNOSIS — K5900 Constipation, unspecified: Secondary | ICD-10-CM | POA: Diagnosis not present

## 2020-05-31 DIAGNOSIS — Z981 Arthrodesis status: Secondary | ICD-10-CM | POA: Diagnosis not present

## 2020-06-03 DIAGNOSIS — M48062 Spinal stenosis, lumbar region with neurogenic claudication: Secondary | ICD-10-CM | POA: Diagnosis not present

## 2020-06-03 DIAGNOSIS — M4804 Spinal stenosis, thoracic region: Secondary | ICD-10-CM | POA: Diagnosis not present

## 2020-06-03 DIAGNOSIS — T8189XD Other complications of procedures, not elsewhere classified, subsequent encounter: Secondary | ICD-10-CM | POA: Diagnosis not present

## 2020-06-03 DIAGNOSIS — M4714 Other spondylosis with myelopathy, thoracic region: Secondary | ICD-10-CM | POA: Diagnosis not present

## 2020-06-03 DIAGNOSIS — I10 Essential (primary) hypertension: Secondary | ICD-10-CM | POA: Diagnosis not present

## 2020-06-03 DIAGNOSIS — Z87820 Personal history of traumatic brain injury: Secondary | ICD-10-CM | POA: Diagnosis not present

## 2020-06-03 DIAGNOSIS — M150 Primary generalized (osteo)arthritis: Secondary | ICD-10-CM | POA: Diagnosis not present

## 2020-06-03 DIAGNOSIS — M5002 Cervical disc disorder with myelopathy, mid-cervical region, unspecified level: Secondary | ICD-10-CM | POA: Diagnosis not present

## 2020-06-03 DIAGNOSIS — G8929 Other chronic pain: Secondary | ICD-10-CM | POA: Diagnosis not present

## 2020-06-03 DIAGNOSIS — K5900 Constipation, unspecified: Secondary | ICD-10-CM | POA: Diagnosis not present

## 2020-06-03 DIAGNOSIS — Z981 Arthrodesis status: Secondary | ICD-10-CM | POA: Diagnosis not present

## 2020-06-07 DIAGNOSIS — M4804 Spinal stenosis, thoracic region: Secondary | ICD-10-CM | POA: Diagnosis not present

## 2020-06-07 DIAGNOSIS — G8929 Other chronic pain: Secondary | ICD-10-CM | POA: Diagnosis not present

## 2020-06-07 DIAGNOSIS — T8189XD Other complications of procedures, not elsewhere classified, subsequent encounter: Secondary | ICD-10-CM | POA: Diagnosis not present

## 2020-06-07 DIAGNOSIS — M150 Primary generalized (osteo)arthritis: Secondary | ICD-10-CM | POA: Diagnosis not present

## 2020-06-07 DIAGNOSIS — K5900 Constipation, unspecified: Secondary | ICD-10-CM | POA: Diagnosis not present

## 2020-06-07 DIAGNOSIS — Z87820 Personal history of traumatic brain injury: Secondary | ICD-10-CM | POA: Diagnosis not present

## 2020-06-07 DIAGNOSIS — M48062 Spinal stenosis, lumbar region with neurogenic claudication: Secondary | ICD-10-CM | POA: Diagnosis not present

## 2020-06-07 DIAGNOSIS — I10 Essential (primary) hypertension: Secondary | ICD-10-CM | POA: Diagnosis not present

## 2020-06-07 DIAGNOSIS — M4714 Other spondylosis with myelopathy, thoracic region: Secondary | ICD-10-CM | POA: Diagnosis not present

## 2020-06-07 DIAGNOSIS — M5002 Cervical disc disorder with myelopathy, mid-cervical region, unspecified level: Secondary | ICD-10-CM | POA: Diagnosis not present

## 2020-06-07 DIAGNOSIS — Z981 Arthrodesis status: Secondary | ICD-10-CM | POA: Diagnosis not present

## 2020-06-15 DIAGNOSIS — Z981 Arthrodesis status: Secondary | ICD-10-CM | POA: Diagnosis not present

## 2020-06-15 DIAGNOSIS — M48062 Spinal stenosis, lumbar region with neurogenic claudication: Secondary | ICD-10-CM | POA: Diagnosis not present

## 2020-06-15 DIAGNOSIS — T8189XD Other complications of procedures, not elsewhere classified, subsequent encounter: Secondary | ICD-10-CM | POA: Diagnosis not present

## 2020-06-15 DIAGNOSIS — G8929 Other chronic pain: Secondary | ICD-10-CM | POA: Diagnosis not present

## 2020-06-15 DIAGNOSIS — M4804 Spinal stenosis, thoracic region: Secondary | ICD-10-CM | POA: Diagnosis not present

## 2020-06-15 DIAGNOSIS — M5002 Cervical disc disorder with myelopathy, mid-cervical region, unspecified level: Secondary | ICD-10-CM | POA: Diagnosis not present

## 2020-06-15 DIAGNOSIS — M150 Primary generalized (osteo)arthritis: Secondary | ICD-10-CM | POA: Diagnosis not present

## 2020-06-15 DIAGNOSIS — I10 Essential (primary) hypertension: Secondary | ICD-10-CM | POA: Diagnosis not present

## 2020-06-15 DIAGNOSIS — Z87820 Personal history of traumatic brain injury: Secondary | ICD-10-CM | POA: Diagnosis not present

## 2020-06-15 DIAGNOSIS — M4714 Other spondylosis with myelopathy, thoracic region: Secondary | ICD-10-CM | POA: Diagnosis not present

## 2020-06-15 DIAGNOSIS — K5900 Constipation, unspecified: Secondary | ICD-10-CM | POA: Diagnosis not present

## 2020-07-12 DIAGNOSIS — H2513 Age-related nuclear cataract, bilateral: Secondary | ICD-10-CM | POA: Diagnosis not present

## 2020-07-12 DIAGNOSIS — H35033 Hypertensive retinopathy, bilateral: Secondary | ICD-10-CM | POA: Diagnosis not present

## 2020-07-12 DIAGNOSIS — H25042 Posterior subcapsular polar age-related cataract, left eye: Secondary | ICD-10-CM | POA: Diagnosis not present

## 2020-07-12 DIAGNOSIS — H25013 Cortical age-related cataract, bilateral: Secondary | ICD-10-CM | POA: Diagnosis not present

## 2020-07-12 LAB — HM DIABETES EYE EXAM

## 2020-07-20 ENCOUNTER — Other Ambulatory Visit: Payer: Self-pay | Admitting: Family Medicine

## 2020-07-20 DIAGNOSIS — G894 Chronic pain syndrome: Secondary | ICD-10-CM

## 2020-07-20 NOTE — Telephone Encounter (Signed)
Ok to refill 

## 2020-07-26 DIAGNOSIS — Z9889 Other specified postprocedural states: Secondary | ICD-10-CM | POA: Diagnosis not present

## 2020-07-26 DIAGNOSIS — M4804 Spinal stenosis, thoracic region: Secondary | ICD-10-CM | POA: Diagnosis not present

## 2020-07-28 DIAGNOSIS — H35033 Hypertensive retinopathy, bilateral: Secondary | ICD-10-CM | POA: Diagnosis not present

## 2020-07-28 DIAGNOSIS — H2513 Age-related nuclear cataract, bilateral: Secondary | ICD-10-CM | POA: Diagnosis not present

## 2020-07-28 DIAGNOSIS — H25013 Cortical age-related cataract, bilateral: Secondary | ICD-10-CM | POA: Diagnosis not present

## 2020-07-28 DIAGNOSIS — H25042 Posterior subcapsular polar age-related cataract, left eye: Secondary | ICD-10-CM | POA: Diagnosis not present

## 2020-07-28 DIAGNOSIS — H2512 Age-related nuclear cataract, left eye: Secondary | ICD-10-CM | POA: Diagnosis not present

## 2020-08-17 DIAGNOSIS — H2512 Age-related nuclear cataract, left eye: Secondary | ICD-10-CM | POA: Diagnosis not present

## 2020-08-17 DIAGNOSIS — H25812 Combined forms of age-related cataract, left eye: Secondary | ICD-10-CM | POA: Diagnosis not present

## 2020-09-12 DIAGNOSIS — H25011 Cortical age-related cataract, right eye: Secondary | ICD-10-CM | POA: Diagnosis not present

## 2020-09-12 DIAGNOSIS — H2511 Age-related nuclear cataract, right eye: Secondary | ICD-10-CM | POA: Diagnosis not present

## 2020-09-16 ENCOUNTER — Other Ambulatory Visit: Payer: Self-pay | Admitting: Family Medicine

## 2020-09-16 DIAGNOSIS — R3911 Hesitancy of micturition: Secondary | ICD-10-CM

## 2020-09-16 DIAGNOSIS — I10 Essential (primary) hypertension: Secondary | ICD-10-CM

## 2020-09-16 DIAGNOSIS — N401 Enlarged prostate with lower urinary tract symptoms: Secondary | ICD-10-CM

## 2020-09-27 ENCOUNTER — Other Ambulatory Visit: Payer: Self-pay | Admitting: Family Medicine

## 2020-09-27 DIAGNOSIS — G894 Chronic pain syndrome: Secondary | ICD-10-CM

## 2020-09-27 DIAGNOSIS — Z9889 Other specified postprocedural states: Secondary | ICD-10-CM | POA: Diagnosis not present

## 2020-09-27 DIAGNOSIS — Z79899 Other long term (current) drug therapy: Secondary | ICD-10-CM | POA: Diagnosis not present

## 2020-09-27 DIAGNOSIS — M48062 Spinal stenosis, lumbar region with neurogenic claudication: Secondary | ICD-10-CM | POA: Diagnosis not present

## 2020-09-27 NOTE — Telephone Encounter (Signed)
[facility] is requesting to fill pt gabapentin. Please advise KH 

## 2020-11-02 DIAGNOSIS — H25011 Cortical age-related cataract, right eye: Secondary | ICD-10-CM | POA: Diagnosis not present

## 2020-11-02 DIAGNOSIS — H2511 Age-related nuclear cataract, right eye: Secondary | ICD-10-CM | POA: Diagnosis not present

## 2020-11-02 DIAGNOSIS — H25811 Combined forms of age-related cataract, right eye: Secondary | ICD-10-CM | POA: Diagnosis not present

## 2020-11-15 DIAGNOSIS — M48062 Spinal stenosis, lumbar region with neurogenic claudication: Secondary | ICD-10-CM | POA: Diagnosis not present

## 2020-11-15 DIAGNOSIS — M4804 Spinal stenosis, thoracic region: Secondary | ICD-10-CM | POA: Diagnosis not present

## 2020-11-15 DIAGNOSIS — Z9889 Other specified postprocedural states: Secondary | ICD-10-CM | POA: Diagnosis not present

## 2020-11-23 ENCOUNTER — Other Ambulatory Visit: Payer: Self-pay | Admitting: Family Medicine

## 2020-11-23 DIAGNOSIS — G894 Chronic pain syndrome: Secondary | ICD-10-CM

## 2020-11-23 NOTE — Telephone Encounter (Signed)
[facility] is requesting to fill pt gabapentin. Please advise KH 

## 2020-12-13 DIAGNOSIS — M48062 Spinal stenosis, lumbar region with neurogenic claudication: Secondary | ICD-10-CM | POA: Diagnosis not present

## 2020-12-15 ENCOUNTER — Other Ambulatory Visit: Payer: Self-pay | Admitting: Family Medicine

## 2020-12-15 DIAGNOSIS — N401 Enlarged prostate with lower urinary tract symptoms: Secondary | ICD-10-CM

## 2020-12-15 DIAGNOSIS — I10 Essential (primary) hypertension: Secondary | ICD-10-CM

## 2020-12-15 DIAGNOSIS — R3911 Hesitancy of micturition: Secondary | ICD-10-CM

## 2020-12-16 ENCOUNTER — Other Ambulatory Visit: Payer: Self-pay | Admitting: Family Medicine

## 2020-12-16 DIAGNOSIS — I10 Essential (primary) hypertension: Secondary | ICD-10-CM

## 2020-12-16 NOTE — Telephone Encounter (Signed)
Lvm for pt to call and schedule a med check appointment. KH

## 2020-12-26 ENCOUNTER — Other Ambulatory Visit: Payer: Self-pay | Admitting: Family Medicine

## 2020-12-26 DIAGNOSIS — R3911 Hesitancy of micturition: Secondary | ICD-10-CM

## 2020-12-26 DIAGNOSIS — N401 Enlarged prostate with lower urinary tract symptoms: Secondary | ICD-10-CM

## 2021-01-02 ENCOUNTER — Encounter (HOSPITAL_COMMUNITY): Payer: Self-pay

## 2021-01-02 ENCOUNTER — Ambulatory Visit (HOSPITAL_COMMUNITY)
Admission: EM | Admit: 2021-01-02 | Discharge: 2021-01-02 | Disposition: A | Discharge status: Home / Self Care | Payer: Medicare Other | Attending: Internal Medicine | Admitting: Internal Medicine

## 2021-01-02 DIAGNOSIS — M545 Low back pain, unspecified: Secondary | ICD-10-CM

## 2021-01-02 LAB — POCT URINALYSIS DIPSTICK, ED / UC
Bilirubin Urine: NEGATIVE
Glucose, UA: NEGATIVE mg/dL
Hgb urine dipstick: NEGATIVE
Ketones, ur: NEGATIVE mg/dL
Leukocytes,Ua: NEGATIVE
Nitrite: NEGATIVE
Protein, ur: NEGATIVE mg/dL
Specific Gravity, Urine: 1.01 (ref 1.005–1.030)
Urobilinogen, UA: 0.2 mg/dL (ref 0.0–1.0)
pH: 5.5 (ref 5.0–8.0)

## 2021-01-02 MED ORDER — METHYLPREDNISOLONE SODIUM SUCC 125 MG IJ SOLR
60.0000 mg | Freq: Once | INTRAMUSCULAR | Status: AC
Start: 2021-01-02 — End: 2021-01-02
  Administered 2021-01-02: 60 mg via INTRAMUSCULAR

## 2021-01-02 MED ORDER — METHYLPREDNISOLONE SODIUM SUCC 125 MG IJ SOLR
INTRAMUSCULAR | Status: AC
  Filled 2021-01-02: qty 2

## 2021-01-02 MED ORDER — METHOCARBAMOL 750 MG PO TABS: 750.0000 mg | ORAL_TABLET | Freq: Three times a day (TID) | ORAL | 0 refills | Status: AC | PRN

## 2021-01-02 NOTE — ED Triage Notes (Signed)
Pt reports severe lower back pain/spasms on both sides for 3 days. Reports the pain radiates around both sides of abdomen. Reports some difficulty urinating (trouble getting stream going).

## 2021-01-02 NOTE — Discharge Instructions (Addendum)
Can use muscle relaxer twice a day as needed for comfort , be mindful this may make you drowsy    Your urinalysis did not show signs of infection or concern  If spasms persist please follow up with primary care doctor for evaluation   Continue drinking water to help flush kidneys, and decrease bladder irritants such as soda, tea, coffee

## 2021-01-05 ENCOUNTER — Ambulatory Visit (HOSPITAL_COMMUNITY)
Admission: EM | Admit: 2021-01-05 | Discharge: 2021-01-05 | Payer: Medicare Other | Attending: Internal Medicine | Admitting: Internal Medicine

## 2021-01-05 ENCOUNTER — Encounter (HOSPITAL_COMMUNITY): Payer: Self-pay

## 2021-01-05 ENCOUNTER — Other Ambulatory Visit: Payer: Self-pay

## 2021-01-05 ENCOUNTER — Emergency Department (HOSPITAL_COMMUNITY): Payer: Medicare Other

## 2021-01-05 ENCOUNTER — Emergency Department (HOSPITAL_COMMUNITY)
Admission: EM | Admit: 2021-01-05 | Discharge: 2021-01-05 | Disposition: A | Discharge status: Home / Self Care | Payer: Medicare Other | Attending: Emergency Medicine | Admitting: Emergency Medicine

## 2021-01-05 ENCOUNTER — Encounter (HOSPITAL_COMMUNITY): Payer: Self-pay | Admitting: Emergency Medicine

## 2021-01-05 ENCOUNTER — Ambulatory Visit (HOSPITAL_COMMUNITY): Payer: Medicare Other

## 2021-01-05 DIAGNOSIS — Z87891 Personal history of nicotine dependence: Secondary | ICD-10-CM | POA: Diagnosis not present

## 2021-01-05 DIAGNOSIS — R0601 Orthopnea: Secondary | ICD-10-CM | POA: Diagnosis not present

## 2021-01-05 DIAGNOSIS — R918 Other nonspecific abnormal finding of lung field: Secondary | ICD-10-CM | POA: Insufficient documentation

## 2021-01-05 DIAGNOSIS — I517 Cardiomegaly: Secondary | ICD-10-CM | POA: Insufficient documentation

## 2021-01-05 DIAGNOSIS — M546 Pain in thoracic spine: Secondary | ICD-10-CM | POA: Insufficient documentation

## 2021-01-05 DIAGNOSIS — R609 Edema, unspecified: Secondary | ICD-10-CM | POA: Insufficient documentation

## 2021-01-05 DIAGNOSIS — Z79899 Other long term (current) drug therapy: Secondary | ICD-10-CM | POA: Diagnosis not present

## 2021-01-05 DIAGNOSIS — M549 Dorsalgia, unspecified: Secondary | ICD-10-CM

## 2021-01-05 DIAGNOSIS — R0602 Shortness of breath: Secondary | ICD-10-CM | POA: Diagnosis not present

## 2021-01-05 DIAGNOSIS — I2699 Other pulmonary embolism without acute cor pulmonale: Secondary | ICD-10-CM | POA: Diagnosis not present

## 2021-01-05 DIAGNOSIS — R0989 Other specified symptoms and signs involving the circulatory and respiratory systems: Secondary | ICD-10-CM

## 2021-01-05 DIAGNOSIS — R062 Wheezing: Secondary | ICD-10-CM | POA: Diagnosis not present

## 2021-01-05 DIAGNOSIS — I10 Essential (primary) hypertension: Secondary | ICD-10-CM | POA: Insufficient documentation

## 2021-01-05 LAB — COMPREHENSIVE METABOLIC PANEL
ALT: 49 U/L — ABNORMAL HIGH (ref 0–44)
AST: 27 U/L (ref 15–41)
Albumin: 3.5 g/dL (ref 3.5–5.0)
Alkaline Phosphatase: 40 U/L (ref 38–126)
Anion gap: 10 (ref 5–15)
BUN: 23 mg/dL (ref 8–23)
CO2: 25 mmol/L (ref 22–32)
Calcium: 9.6 mg/dL (ref 8.9–10.3)
Chloride: 105 mmol/L (ref 98–111)
Creatinine, Ser: 0.88 mg/dL (ref 0.61–1.24)
GFR, Estimated: >60 mL/min (ref >60)
Glucose, Bld: 111 mg/dL — ABNORMAL HIGH (ref 70–99)
Potassium: 3.9 mmol/L (ref 3.5–5.1)
Sodium: 140 mmol/L (ref 135–145)
Total Bilirubin: 0.8 mg/dL (ref 0.3–1.2)
Total Protein: 7.7 g/dL (ref 6.5–8.1)

## 2021-01-05 LAB — TROPONIN I (HIGH SENSITIVITY)
Troponin I (High Sensitivity): 10 ng/L (ref <18)
Troponin I (High Sensitivity): 12 ng/L (ref <18)

## 2021-01-05 LAB — CBC WITH DIFFERENTIAL/PLATELET
Abs Immature Granulocytes: 0.06 10*3/uL (ref 0.00–0.07)
Basophils Absolute: 0 10*3/uL (ref 0.0–0.1)
Basophils Relative: 0 %
Eosinophils Absolute: 0.1 10*3/uL (ref 0.0–0.5)
Eosinophils Relative: 1 %
HCT: 50.6 % (ref 39.0–52.0)
Hemoglobin: 15.9 g/dL (ref 13.0–17.0)
Immature Granulocytes: 1 %
Lymphocytes Relative: 14 %
Lymphs Abs: 1.7 10*3/uL (ref 0.7–4.0)
MCH: 27.7 pg (ref 26.0–34.0)
MCHC: 31.4 g/dL (ref 30.0–36.0)
MCV: 88.2 fL (ref 80.0–100.0)
Monocytes Absolute: 1.3 10*3/uL — ABNORMAL HIGH (ref 0.1–1.0)
Monocytes Relative: 10 %
Neutro Abs: 9.4 10*3/uL — ABNORMAL HIGH (ref 1.7–7.7)
Neutrophils Relative %: 74 %
Platelets: 207 10*3/uL (ref 150–400)
RBC: 5.74 MIL/uL (ref 4.22–5.81)
RDW: 14.1 % (ref 11.5–15.5)
WBC: 12.5 10*3/uL — ABNORMAL HIGH (ref 4.0–10.5)
nRBC: 0 % (ref 0.0–0.2)

## 2021-01-05 LAB — D-DIMER, QUANTITATIVE: D-Dimer, Quant: 1.3 ug/mL-FEU — ABNORMAL HIGH (ref 0.00–0.50)

## 2021-01-05 LAB — BRAIN NATRIURETIC PEPTIDE: B Natriuretic Peptide: 46.9 pg/mL (ref 0.0–100.0)

## 2021-01-05 IMAGING — CT CT ANGIO CHEST
2 of 6 series · 18 of 36 positions shown · IV contrast (omnipaque)
Comparison: None.

CLINICAL DATA: PE suspected, shortness of breath

EXAM:
CT ANGIOGRAPHY CHEST WITH CONTRAST
TECHNIQUE: Multidetector CT imaging of the chest was performed using the
standard protocol during bolus administration of intravenous
contrast. Multiplanar CT image reconstructions and MIPs were
obtained to evaluate the vascular anatomy.
CONTRAST:  75mL OMNIPAQUE IOHEXOL 350 MG/ML SOLN

[Series 7: pe thins · axial · 0.98mm/px · z∈[+1125,+1384]mm · 17 of 413 slices shown]
[im 21/413  lung]
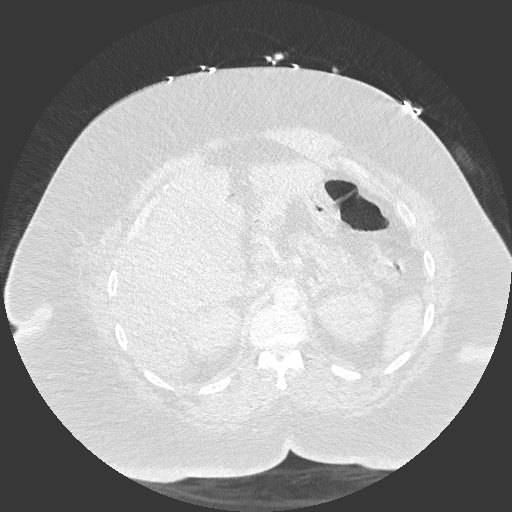
[im 42/413  mediastinal]
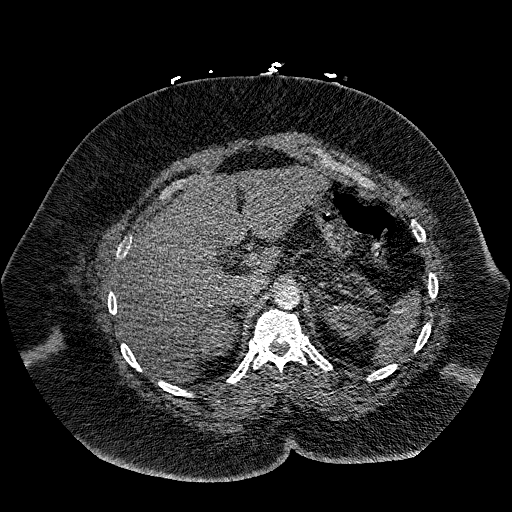
[im 62/413  lung]
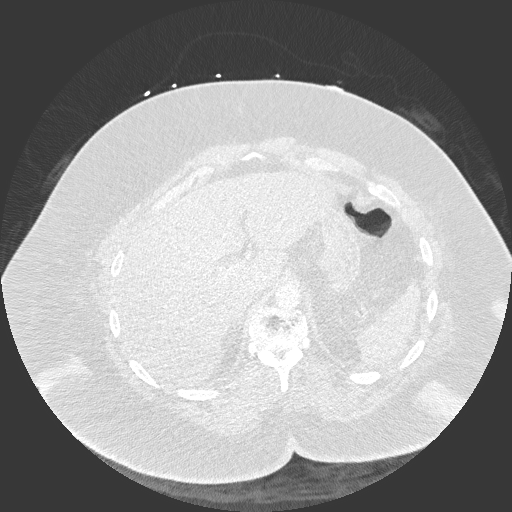
[im 83/413  mediastinal]
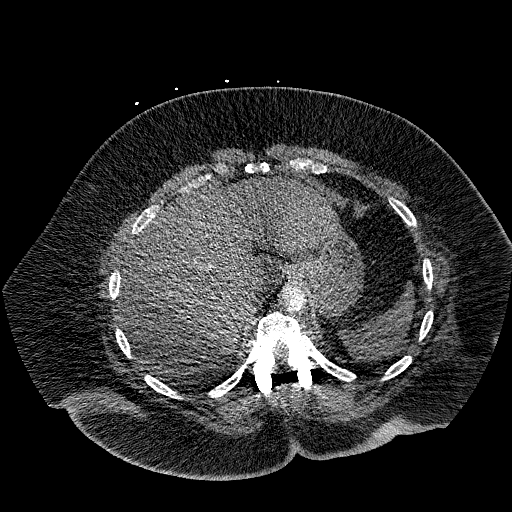
[im 124/413  lung]
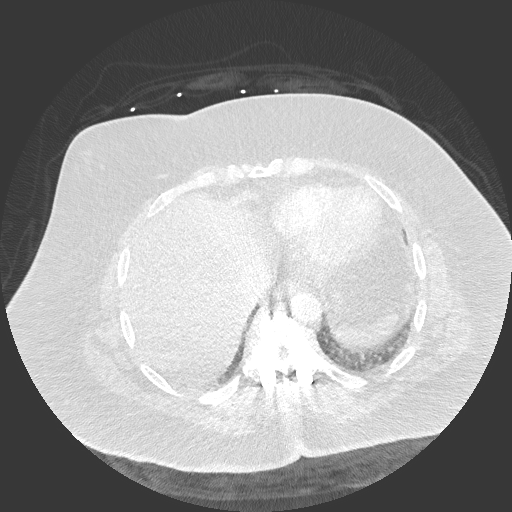
[im 145/413  mediastinal]
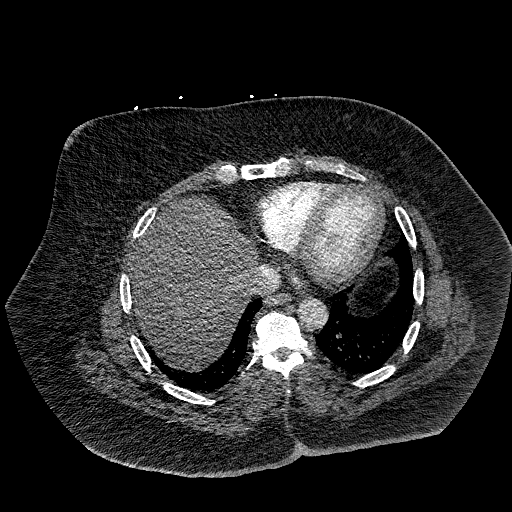
[im 165/413  lung]
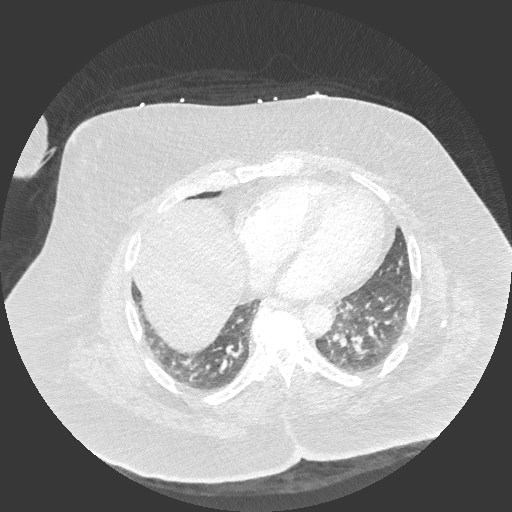
[im 186/413  mediastinal]
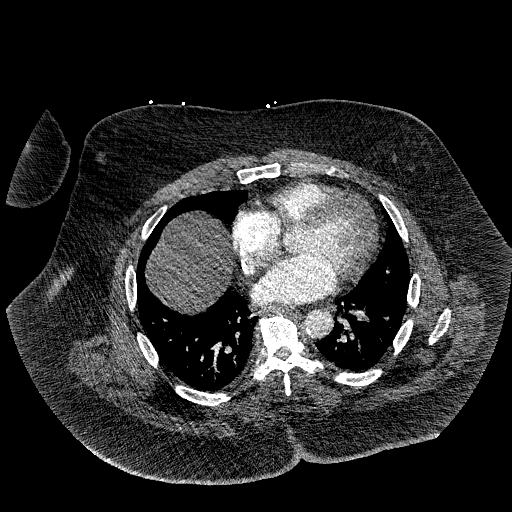
[im 207/413  lung]
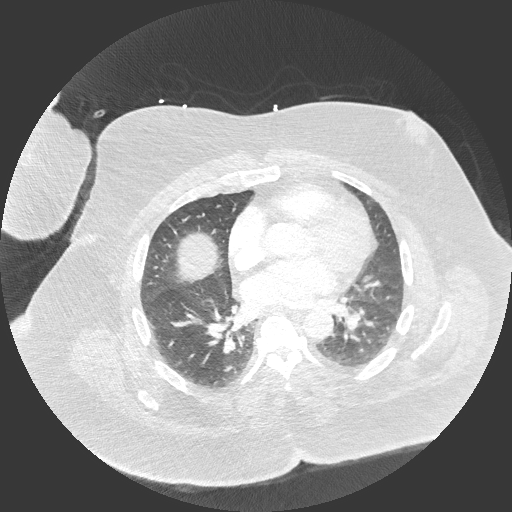
[im 227/413  mediastinal]
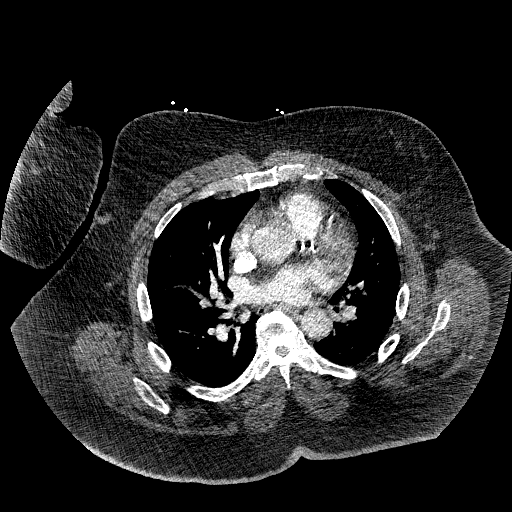
[im 248/413  lung]
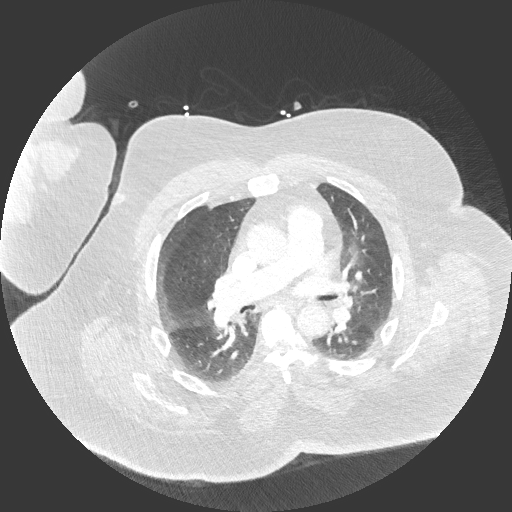
[im 268/413  mediastinal]
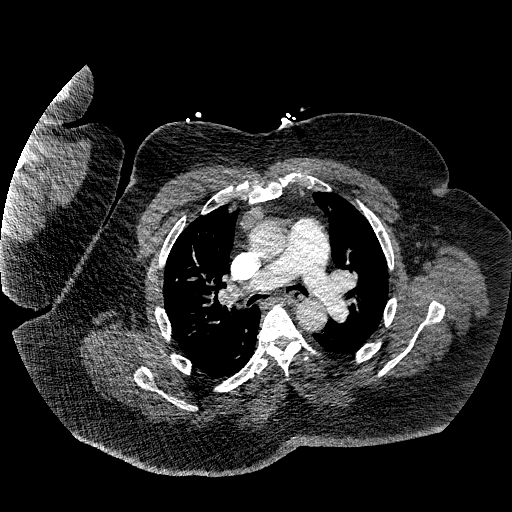
[im 289/413  lung]
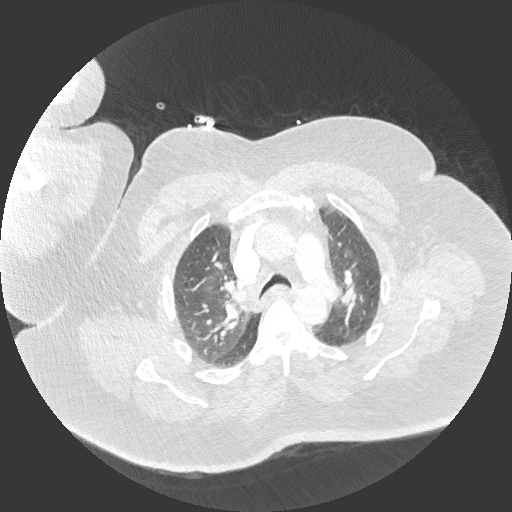
[im 330/413  mediastinal]
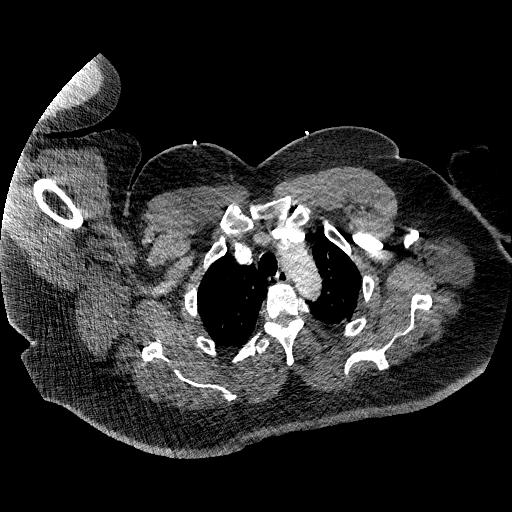
[im 351/413  lung]
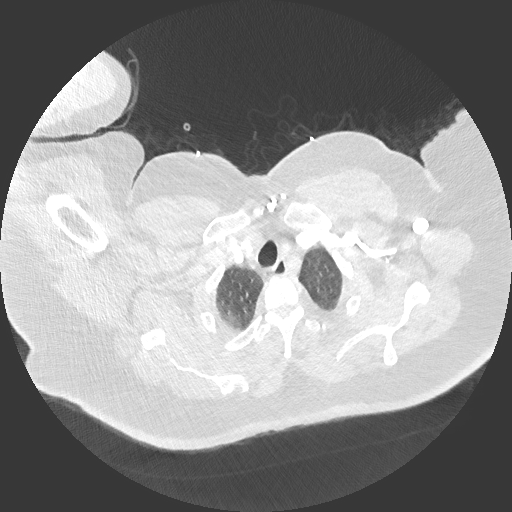
[im 371/413  mediastinal]
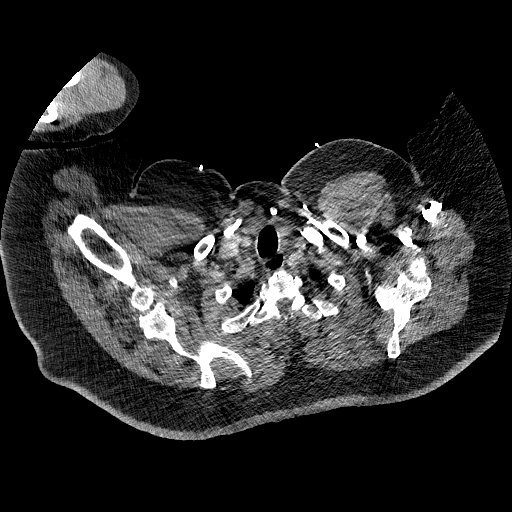
[im 392/413  lung]
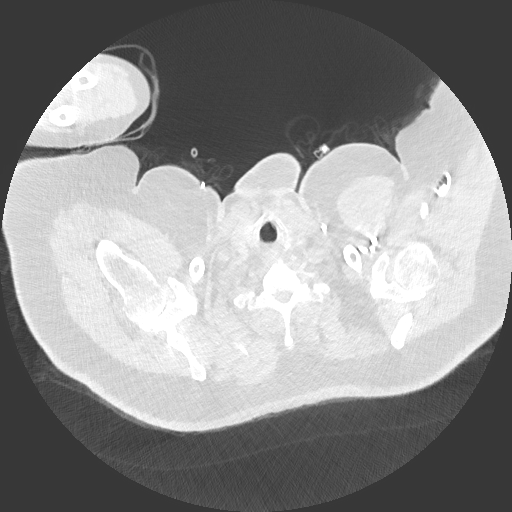

[Series 8: pe 2mm cor · coronal · 0.59mm/px · 1 of 185 slices shown]
[im 93/185  mediastinal]
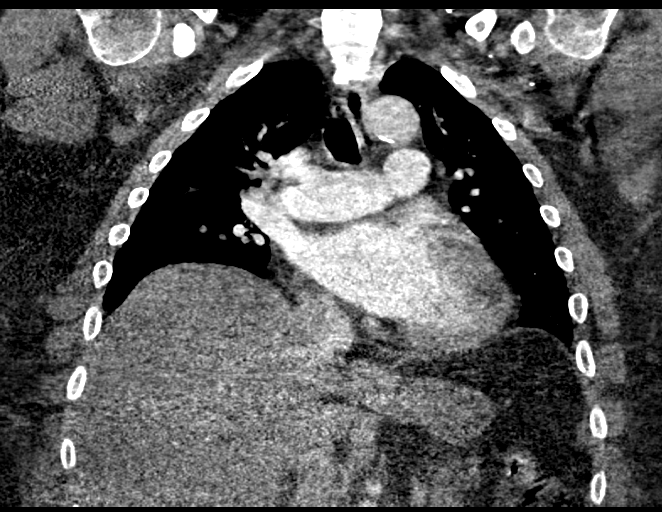

[18 of 36 positions shown; findings below may reference images not displayed]

FINDINGS: Cardiovascular: Examination for pulmonary embolism is limited by
marginal contrast bolus, main pulmonary artery = 177 HU, body
habitus, and streak artifact from arm positioning. Within this
limitation, no evidence of pulmonary embolism through the proximal
segmental pulmonary arterial level. Mild cardiomegaly. Three-vessel
coronary artery calcifications. No pericardial effusion. Enlargement
of the main pulmonary artery measuring up to 4.0 cm in caliber.

Mediastinum/Nodes: No enlarged mediastinal, hilar, or axillary lymph
nodes. Thyroid gland, trachea, and esophagus demonstrate no
significant findings.

Lungs/Pleura: Lungs are clear. No pleural effusion or pneumothorax.

Upper Abdomen: No acute abnormality.

Musculoskeletal: No chest wall abnormality. No acute or significant
osseous findings.

Review of the MIP images confirms the above findings.
IMPRESSION: 1. Examination for pulmonary embolism is limited by marginal
contrast bolus, body habitus, and streak artifact from arm
positioning. Within this limitation, no evidence of pulmonary
embolism through the proximal segmental pulmonary arterial level.
2. Cardiomegaly and coronary artery disease.
3. Enlargement of the main pulmonary artery measuring up to 4.0 cm
in caliber, as can be seen in pulmonary hypertension.

## 2021-01-05 IMAGING — CR DG CHEST 2V
2 series · 2 of 2 positions shown · non-contrast
Comparison: Portable chest [DATE] and earlier.

CLINICAL DATA: 70-year-old male with shortness of breath. Wheezing.

EXAM:
CHEST - 2 VIEW

[chest lat]
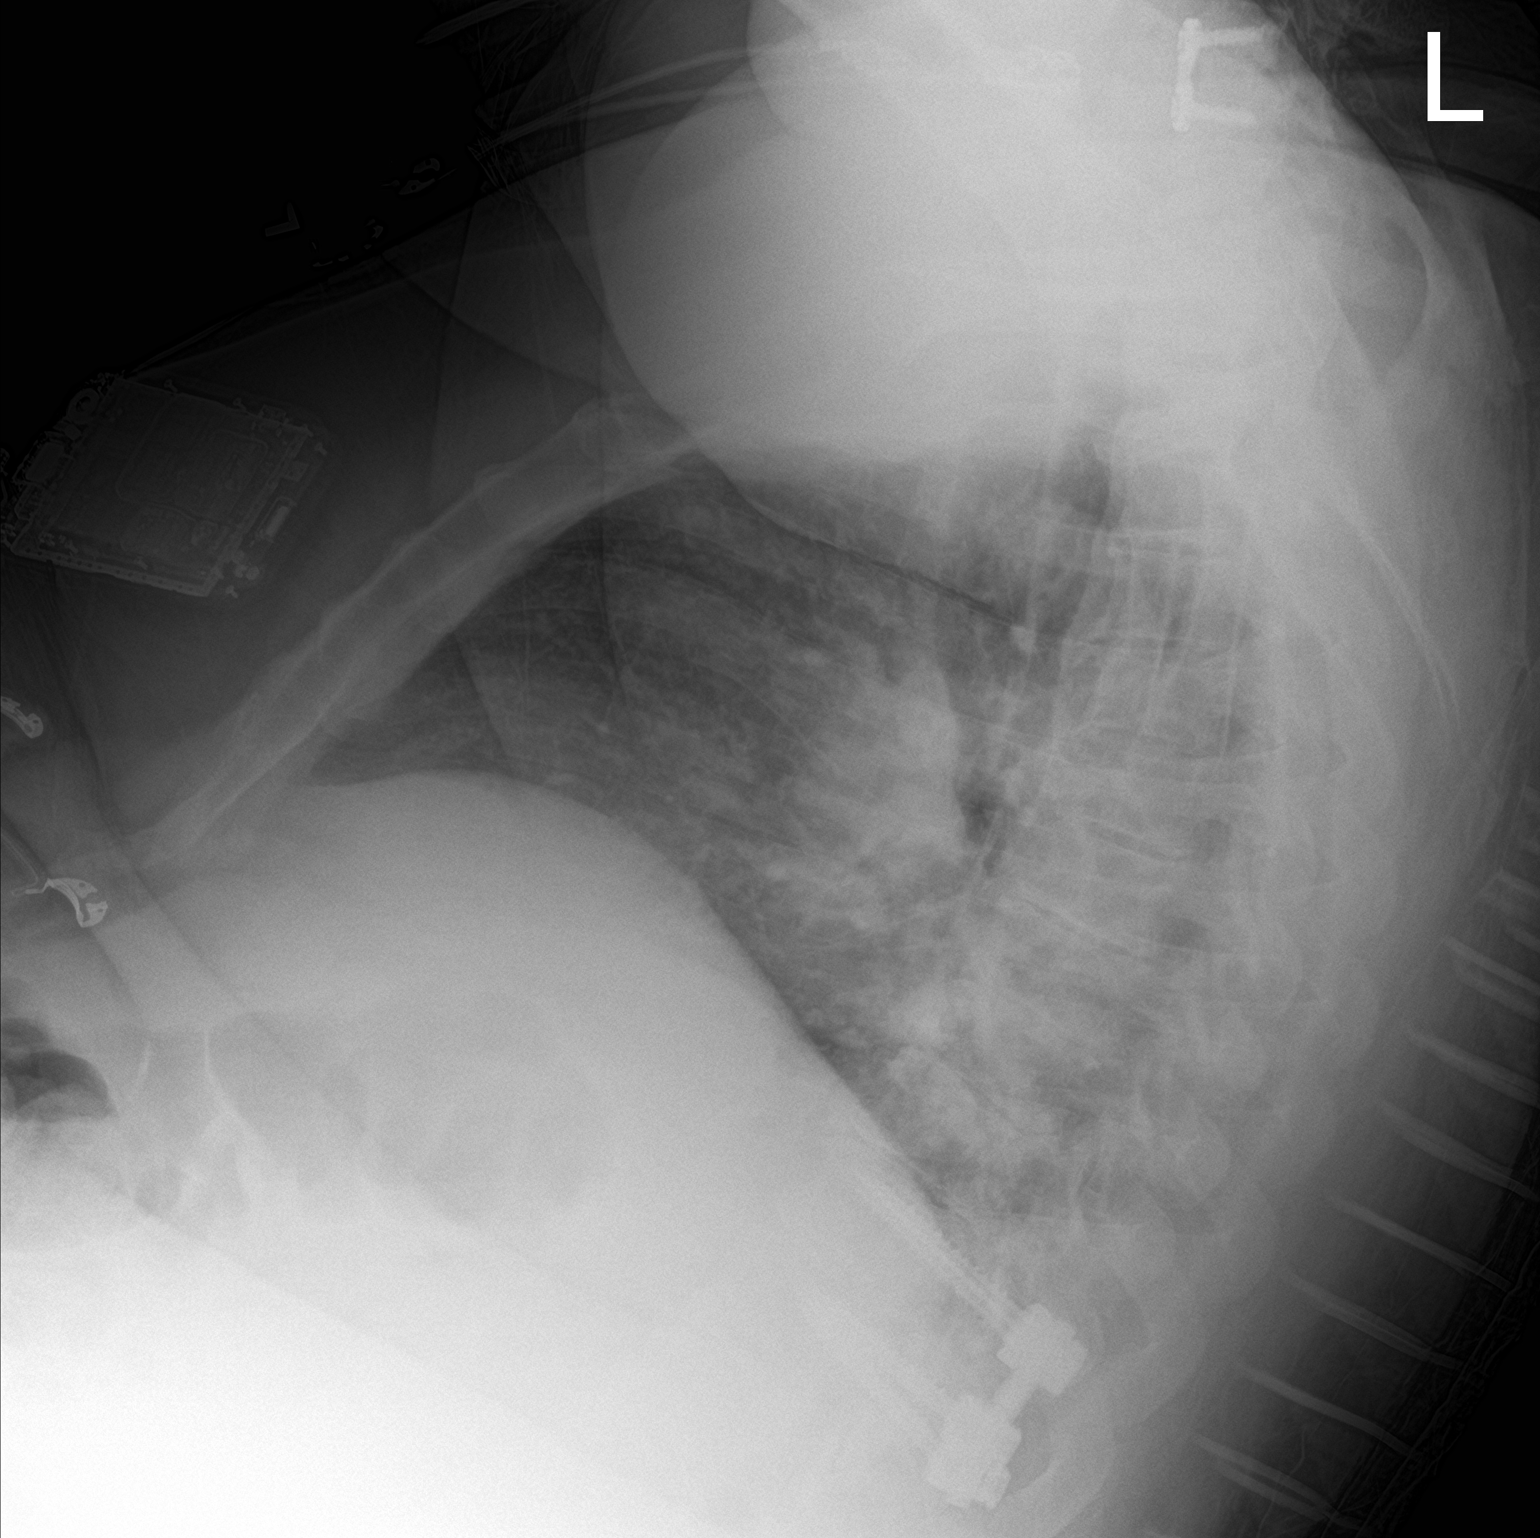

[chest ap]
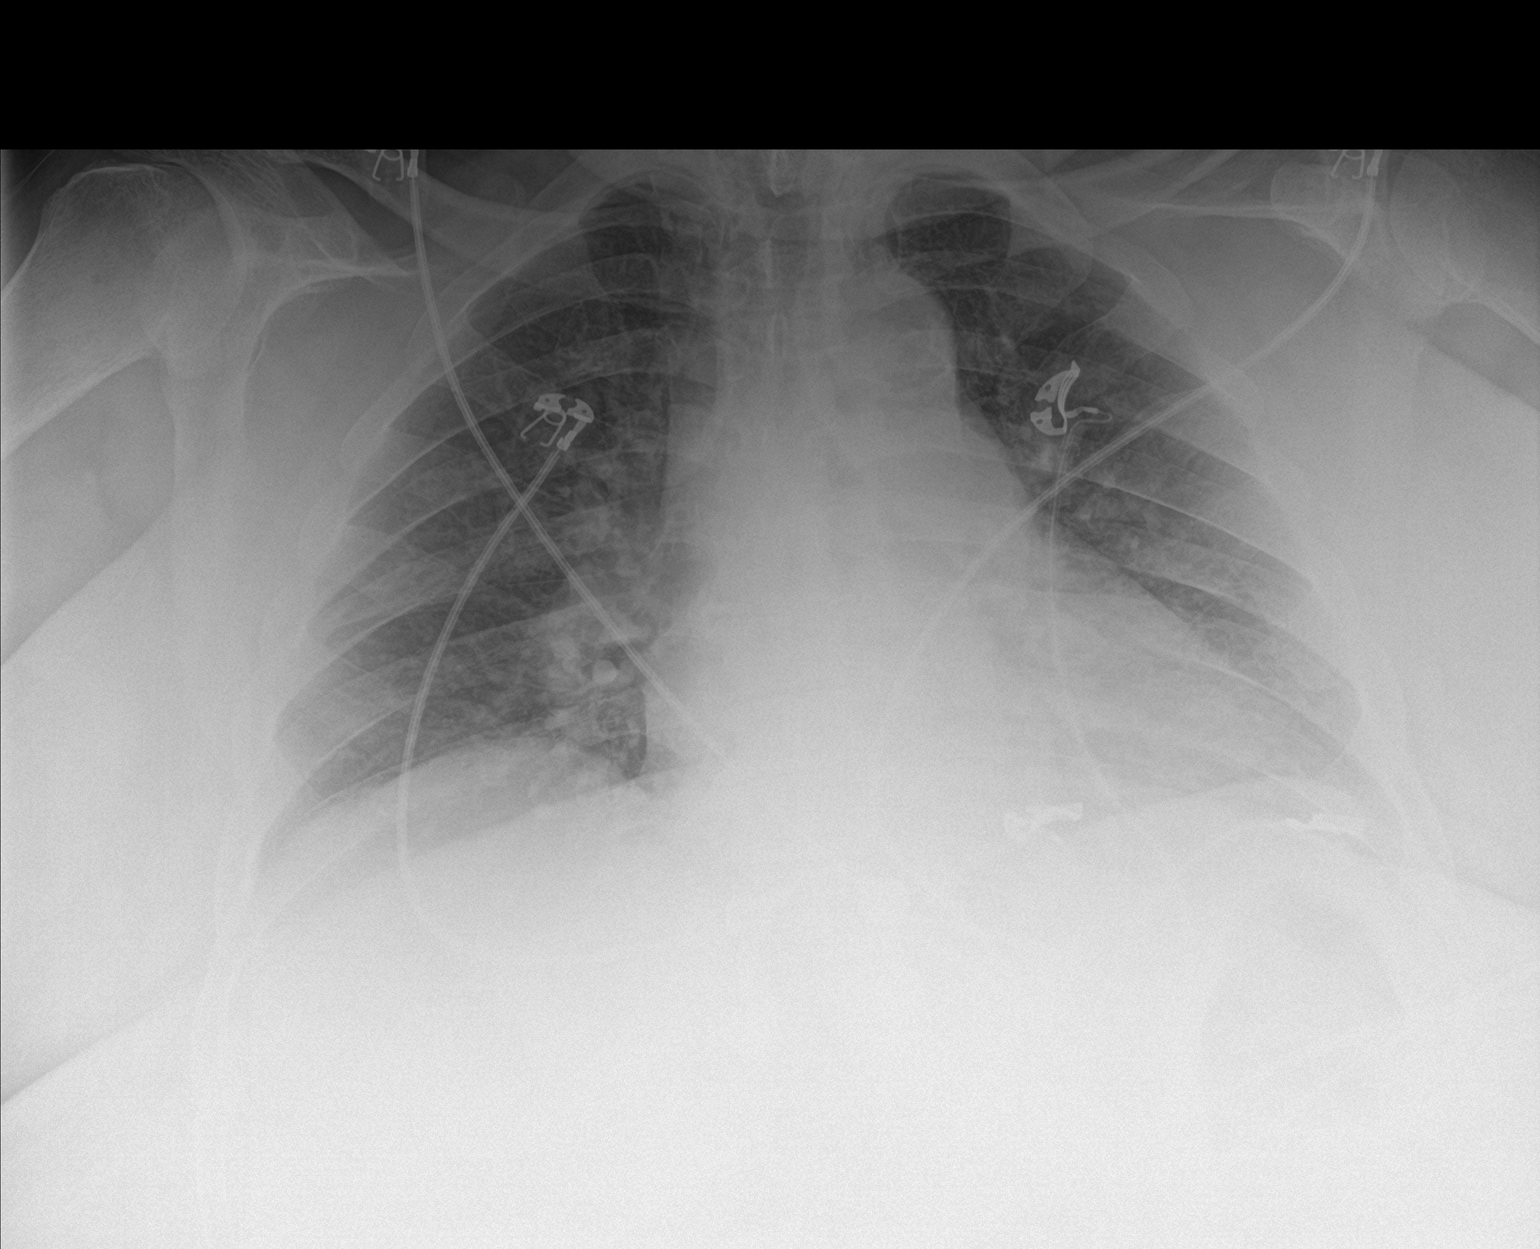

[2 of 2 positions shown; findings below may reference images not displayed]

FINDINGS: Seated AP and lateral views of the chest. Low lung volumes. Cardiac
size at the upper limits of normal. Other mediastinal contours are
within normal limits. Visualized tracheal air column is within
normal limits. No pneumothorax, pulmonary edema, pleural effusion or
consolidation identified. The hila appear stable since [S3],
probably with crowding of lung markings.

Partially visible cervical and thoracolumbar junction level spinal
fusion. No acute osseous abnormality identified. Negative visible
bowel gas pattern.
IMPRESSION: Low lung volumes.  No acute cardiopulmonary abnormality.

## 2021-01-05 MED ORDER — TIZANIDINE HCL 4 MG PO TABS
4.0000 mg | ORAL_TABLET | Freq: Once | ORAL | Status: AC
  Administered 2021-01-05: 4 mg via ORAL
  Filled 2021-01-05: qty 1

## 2021-01-05 MED ORDER — FENTANYL CITRATE (PF) 100 MCG/2ML IJ SOLN
50.0000 ug | Freq: Once | INTRAMUSCULAR | Status: AC
  Administered 2021-01-05: 50 ug via INTRAVENOUS
  Filled 2021-01-05: qty 2

## 2021-01-05 MED ORDER — KETOROLAC TROMETHAMINE 30 MG/ML IJ SOLN: 30.0000 mg | Freq: Once | INTRAMUSCULAR | Status: DC

## 2021-01-05 MED ORDER — TIZANIDINE HCL 4 MG PO TABS: 4.0000 mg | ORAL_TABLET | Freq: Three times a day (TID) | ORAL | 0 refills | Status: AC | PRN

## 2021-01-05 MED ORDER — KETOROLAC TROMETHAMINE 15 MG/ML IJ SOLN
15.0000 mg | Freq: Once | INTRAMUSCULAR | Status: AC
  Administered 2021-01-05: 15 mg via INTRAVENOUS
  Filled 2021-01-05: qty 1

## 2021-01-05 MED ORDER — NAPROXEN 500 MG PO TABS: 500.0000 mg | ORAL_TABLET | Freq: Two times a day (BID) | ORAL | 0 refills | Status: AC

## 2021-01-05 MED ORDER — IOHEXOL 350 MG/ML SOLN
75.0000 mL | Freq: Once | INTRAVENOUS | Status: AC | PRN
  Administered 2021-01-05: 75 mL via INTRAVENOUS
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
## 2021-01-05 NOTE — ED Notes (Signed)
Got patient on the monitor did ekg shown to Dr Hyacinth Meeker patient is resting with call bell in reach

## 2021-01-05 NOTE — Discharge Instructions (Addendum)
Patient was discharged to the hospital via EMS.

## 2021-01-05 NOTE — ED Notes (Addendum)
Patient placed on 2L [place],. Nursing care returned to [place], [place]
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
## 2021-01-05 NOTE — ED Notes (Signed)
Patient transported to CT 

## 2021-01-05 NOTE — ED Notes (Signed)
Patient is being discharged from the Urgent Care and sent to the Emergency Department via EMS . Per Provider Henrene Dodge, patient is in need of higher level of care due to respiratory distress . Patient is aware and verbalizes understanding of plan of care.   Vitals:   01/05/21 0832 01/05/21 0911  BP: (!) 155/85   Pulse: (!) 101   Resp: (!) 22   Temp: 98.2 F (36.8 C)   SpO2: 98% (!) 87%
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
## 2021-01-05 NOTE — Discharge Instructions (Addendum)
Follow-up with Dr. Otelia Sergeant regarding your back pain. Take naproxen 2 times a day with meals.  Do not take other anti-inflammatories at the same time (Advil, Motrin, ibuprofen, Aleve). You may supplement with Tylenol if you need further pain control. Use tizanidine to help with muscle spasm and pain.  Stop taking the Robaxin, as he should not take these together. Your work-up today showed that your heart is enlarged and you likely have a condition called pulmonary hypertension.  You need to follow-up with cardiology for further evaluation of this condition, especially as this may be contributing to your symptoms. Return to emergency room if you develop fevers, severe worsening pain, or any new, worsening, or concerning symptoms
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
## 2021-01-05 NOTE — ED Notes (Signed)
Patient left ED via WC accompanied by ED Tech. Patient is A/O at time of DC. No s/s RXN to IV/PO medications noted at time of DC. Patient received Rx and DC instructions prior to DC and was given an opportunity to ask questions. Patient verbalized understand ing of medications and instructions.

## 2021-01-05 NOTE — ED Notes (Signed)
Called to radiology room by nancy, rad tech.  Patient lying flat on table and grunting respirations. Patient is large patient.  Patient said he was sore.  Notified provider, haley, pa, to come to radiology room.

## 2021-01-05 NOTE — ED Notes (Signed)
This Clinical research associate spoke to [employer] and gave report.

## 2021-01-05 NOTE — ED Triage Notes (Signed)
Patient c/o mid lower back pain x 4 days.   Patient endorses severe pain at all times, no matter position, sitting, or ambulating.   Patient rates pain 10/10.   Patient denies fall or trauma.   Patient was seen on 7/11 for same symptoms.   Patient has previous back surgery 7 months ago.   Patient has taken muscle relaxer with no relief of symptoms.

## 2021-01-09 DIAGNOSIS — Z9889 Other specified postprocedural states: Secondary | ICD-10-CM | POA: Diagnosis not present

## 2021-01-09 DIAGNOSIS — M48062 Spinal stenosis, lumbar region with neurogenic claudication: Secondary | ICD-10-CM | POA: Diagnosis not present

## 2021-01-09 DIAGNOSIS — M4804 Spinal stenosis, thoracic region: Secondary | ICD-10-CM | POA: Diagnosis not present

## 2021-01-12 ENCOUNTER — Telehealth: Payer: Self-pay | Admitting: *Deleted

## 2021-01-12 ENCOUNTER — Other Ambulatory Visit: Payer: Self-pay

## 2021-01-12 ENCOUNTER — Ambulatory Visit (INDEPENDENT_AMBULATORY_CARE_PROVIDER_SITE_OTHER): Payer: Medicare Other | Admitting: Interventional Cardiology

## 2021-01-12 ENCOUNTER — Encounter: Payer: Self-pay | Admitting: Interventional Cardiology

## 2021-01-12 ENCOUNTER — Ambulatory Visit: Payer: Medicare Other | Admitting: Surgery

## 2021-01-12 VITALS — BP 150/86 | HR 100 | Ht 71.0 in | Wt 377.6 lb

## 2021-01-12 DIAGNOSIS — I272 Pulmonary hypertension, unspecified: Secondary | ICD-10-CM | POA: Diagnosis not present

## 2021-01-12 DIAGNOSIS — I10 Essential (primary) hypertension: Secondary | ICD-10-CM | POA: Diagnosis not present

## 2021-01-12 DIAGNOSIS — R400 Somnolence: Secondary | ICD-10-CM

## 2021-01-12 DIAGNOSIS — I5032 Chronic diastolic (congestive) heart failure: Secondary | ICD-10-CM

## 2021-01-12 DIAGNOSIS — R0683 Snoring: Secondary | ICD-10-CM

## 2021-01-12 DIAGNOSIS — R600 Localized edema: Secondary | ICD-10-CM | POA: Diagnosis not present

## 2021-01-12 DIAGNOSIS — R0600 Dyspnea, unspecified: Secondary | ICD-10-CM | POA: Diagnosis not present

## 2021-01-12 MED ORDER — LISINOPRIL 40 MG PO TABS: 40.0000 mg | ORAL_TABLET | Freq: Every day | ORAL | 6 refills | Status: AC

## 2021-01-12 MED ORDER — FUROSEMIDE 40 MG PO TABS
40.0000 mg | ORAL_TABLET | Freq: Every day | ORAL | 6 refills | Status: DC
Start: 2021-01-12 — End: 2021-02-16
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
## 2021-01-12 NOTE — Patient Instructions (Signed)
Medication Instructions:  Your physician has recommended you make the following change in your medication: Stop lisinopril/hydrochlorothiazide Start lisinopril 40 mg by mouth daily. Start furosemide 40 mg by mouth daily.   *If you need a refill on your cardiac medications before your next appointment, please call your pharmacy*   Lab Work: Your physician recommends that you return for lab work in: one week--BMP  If you have labs (blood work) drawn today and your tests are completely normal, you will receive your results only by: MyChart Message (if you have MyChart) OR A paper copy in the mail If you have any lab test that is abnormal or we need to change your treatment, we will call you to review the results.   Testing/Procedures: Your physician has requested that you have an echocardiogram. Echocardiography is a painless test that uses sound waves to create images of your heart. It provides your doctor with information about the size and shape of your heart and how well your heart's chambers and valves are working. This procedure takes approximately one hour. There are no restrictions for this procedure.  Your physician has recommended that you have a sleep study. This test records several body functions during sleep, including: brain activity, eye movement, oxygen and carbon dioxide blood levels, heart rate and rhythm, breathing rate and rhythm, the flow of air through your mouth and nose, snoring, body muscle movements, and chest and belly movement.    Follow-Up: At [facility], you and your health needs are our priority.  As part of our continuing mission to provide you with exceptional heart care, we have created designated Provider Care Teams.  These Care Teams include your primary Cardiologist (physician) and Advanced Practice Providers (APPs -  Physician Assistants and Nurse Practitioners) who all work together to provide you with the care you need, when you need it.  We  recommend signing up for the patient portal called "MyChart".  Sign up information is provided on this After Visit Summary.  MyChart is used to connect with patients for Virtual Visits (Telemedicine).  Patients are able to view lab/test results, encounter notes, upcoming appointments, etc.  Non-urgent messages can be sent to your provider as well.   To learn more about what you can do with MyChart, go to [email or web address].    Your next appointment:   After echo  The format for your next appointment:   In Person  Provider:   You may see Lance Muss, MD or one of the following Advanced Practice Providers on your designated Care Team:   Ronie Spies, PA-C Jacolyn Reedy, PA-C   Other Instructions  Elevate legs as much as possible  Low-Sodium Eating Plan Sodium, which is an element that makes up salt, helps you maintain a healthy balance of fluids in your body. Too much sodium can increase your bloodpressure and cause fluid and waste to be held in your body. Your health care provider or dietitian may recommend following this plan if you have high blood pressure (hypertension), kidney disease, liver disease, or heart failure. Eating less sodium can help lower your blood pressure, reduce swelling, and protect your heart, liver, andkidneys. What are tips for following this plan? Reading food labels The Nutrition Facts label lists the amount of sodium in one serving of the food. If you eat more than one serving, you must multiply the listed amount of sodium by the number of servings. Choose foods with less than 140 mg of sodium per serving. Avoid foods with 300 mg  of sodium or more per serving. Shopping  Look for lower-sodium products, often labeled as "low-sodium" or "no salt added." Always check the sodium content, even if foods are labeled as "unsalted" or "no salt added." Buy fresh foods. Avoid canned foods and pre-made or frozen meals. Avoid canned, cured, or processed  meats. Buy breads that have less than 80 mg of sodium per slice.  Cooking  Eat more home-cooked food and less restaurant, buffet, and fast food. Avoid adding salt when cooking. Use salt-free seasonings or herbs instead of table salt or sea salt. Check with your health care provider or pharmacist before using salt substitutes. Cook with plant-based oils, such as canola, sunflower, or olive oil.  Meal planning When eating at a restaurant, ask that your food be prepared with less salt or no salt, if possible. Avoid dishes labeled as brined, pickled, cured, smoked, or made with soy sauce, miso, or teriyaki sauce. Avoid foods that contain MSG (monosodium glutamate). MSG is sometimes added to [place] food, bouillon, and some canned foods. Make meals that can be grilled, baked, poached, roasted, or steamed. These are generally made with less sodium. General information Most people on this plan should limit their sodium intake to 1,500-2,000 mg (milligrams) of sodium each day. What foods should I eat? Fruits Fresh, frozen, or canned fruit. Fruit juice. Vegetables Fresh or frozen vegetables. "No salt added" canned vegetables. "No salt added"tomato sauce and paste. Low-sodium or reduced-sodium tomato and vegetable juice. Grains Low-sodium cereals, including oats, puffed wheat and rice, and shredded wheat. Low-sodium crackers. Unsalted rice. Unsalted pasta. Low-sodium bread.Whole-grain breads and whole-grain pasta. Meats and other proteins Fresh or frozen (no salt added) meat, poultry, seafood, and fish. Low-sodium canned tuna and salmon. Unsalted nuts. Dried peas, beans, and lentils withoutadded salt. Unsalted canned beans. Eggs. Unsalted nut butters. Dairy Milk. Soy milk. Cheese that is naturally low in sodium, such as ricotta cheese, fresh mozzarella, or Swiss cheese. Low-sodium or reduced-sodium cheese. Creamcheese. Yogurt. Seasonings and condiments Fresh and dried herbs and spices. Salt-free  seasonings. Low-sodium mustard and ketchup. Sodium-free salad dressing. Sodium-free light mayonnaise. Fresh orrefrigerated horseradish. Lemon juice. Vinegar. Other foods Homemade, reduced-sodium, or low-sodium soups. Unsalted popcorn and pretzels.Low-salt or salt-free chips. The items listed above may not be a complete list of foods and beverages you can eat. Contact a dietitian for more information. What foods should I avoid? Vegetables Sauerkraut, pickled vegetables, and relishes. Olives. [place] fries. Onion rings. Regular canned vegetables (not low-sodium or reduced-sodium). Regular canned tomato sauce and paste (not low-sodium or reduced-sodium). Regular tomato and vegetable juice (not low-sodium or reduced-sodium). Frozenvegetables in sauces. Grains Instant hot cereals. Bread stuffing, pancake, and biscuit mixes. Croutons. Seasoned rice or pasta mixes. Noodle soup cups. Boxed or frozen macaroni andcheese. Regular salted crackers. Self-rising flour. Meats and other proteins Meat or fish that is salted, canned, smoked, spiced, or pickled. Precooked or cured meat, such as sausages or meat loaves. Tomasa Blase. Ham. Pepperoni. Hot dogs. Corned beef. Chipped beef. Salt pork. Jerky. Pickled herring. Anchovies andsardines. Regular canned tuna. Salted nuts. Dairy Processed cheese and cheese spreads. Hard cheeses. Cheese curds. Blue cheese.Feta cheese. String cheese. Regular cottage cheese. Buttermilk. Canned milk. Fats and oils Salted butter. Regular margarine. Ghee. Bacon fat. Seasonings and condiments Onion salt, garlic salt, seasoned salt, table salt, and sea salt. Canned and packaged gravies. Worcestershire sauce. Tartar sauce. Barbecue sauce. Teriyaki sauce. Soy sauce, including reduced-sodium. Steak sauce. Fish sauce. Oyster sauce. Cocktail sauce. Horseradish that you find on the shelf. Regular  ketchup and mustard. Meat flavorings and tenderizers. Bouillon cubes. Hot sauce. Pre-made or packaged  marinades. Pre-made or packaged taco seasonings. Relishes.Regular salad dressings. Salsa. Other foods Salted popcorn and pretzels. Corn chips and puffs. Potato and tortilla chips.Canned or dried soups. Pizza. Frozen entrees and pot pies. The items listed above may not be a complete list of foods and beverages you should avoid. Contact a dietitian for more information. Summary Eating less sodium can help lower your blood pressure, reduce swelling, and protect your heart, liver, and kidneys. Most people on this plan should limit their sodium intake to 1,500-2,000 mg (milligrams) of sodium each day. Canned, boxed, and frozen foods are high in sodium. Restaurant foods, fast foods, and pizza are also very high in sodium. You also get sodium by adding salt to food. Try to cook at home, eat more fresh fruits and vegetables, and eat less fast food and canned, processed, or prepared foods. This information is not intended to replace advice given to you by your health care provider. Make sure you discuss any questions you have with your healthcare provider. Document Revised: 07/17/2019 Document Reviewed: 05/13/2019 Elsevier Patient Education  2022 [employer].

## 2021-01-12 NOTE — Telephone Encounter (Signed)
-----   Message from Dossie Arbour, RN sent at 01/12/2021  3:35 PM EDT ----- Regarding: sleep study Dr Eldridge Dace has ordered a sleep study for this patient. Thanks, Dennie Bible

## 2021-01-12 NOTE — Telephone Encounter (Signed)
Prior Authorization for SPLIT NIGHT sent to [facility] via web portal.  Notification or Prior Authorization is not required for the requested services  Decision ID #:[id number] .

## 2021-01-18 NOTE — Telephone Encounter (Signed)
Patient is scheduled for lab study on 03/27/21. Patient understands her sleep study will be done at [facility] sleep lab. Patient understands she will receive a sleep packet in a week or so. Patient understands to call if she does not receive the sleep packet in a timely manner. Patient agrees with treatment and thanked me for call.

## 2021-01-19 ENCOUNTER — Other Ambulatory Visit: Payer: Self-pay

## 2021-01-19 ENCOUNTER — Other Ambulatory Visit: Payer: Medicare Other | Admitting: *Deleted

## 2021-01-19 DIAGNOSIS — I5032 Chronic diastolic (congestive) heart failure: Secondary | ICD-10-CM

## 2021-01-19 DIAGNOSIS — R0600 Dyspnea, unspecified: Secondary | ICD-10-CM | POA: Diagnosis not present

## 2021-01-19 LAB — BASIC METABOLIC PANEL
BUN/Creatinine Ratio: 11 (ref 10–24)
BUN: 9 mg/dL (ref 8–27)
CO2: 26 mmol/L (ref 20–29)
Calcium: 9.2 mg/dL (ref 8.6–10.2)
Chloride: 99 mmol/L (ref 96–106)
Creatinine, Ser: 0.82 mg/dL (ref 0.76–1.27)
Glucose: 109 mg/dL — ABNORMAL HIGH (ref 65–99)
Potassium: 4.1 mmol/L (ref 3.5–5.2)
Sodium: 138 mmol/L (ref 134–144)
eGFR: 93 mL/min/{1.73_m2} (ref >59)

## 2021-01-20 ENCOUNTER — Other Ambulatory Visit: Payer: Self-pay | Admitting: Family Medicine

## 2021-01-20 DIAGNOSIS — G894 Chronic pain syndrome: Secondary | ICD-10-CM

## 2021-01-20 NOTE — Telephone Encounter (Signed)
Request for refill on gabapentin pt. Last apt was 03/15/20 no future apt.

## 2021-02-01 DIAGNOSIS — G959 Disease of spinal cord, unspecified: Secondary | ICD-10-CM | POA: Diagnosis not present

## 2021-02-02 ENCOUNTER — Other Ambulatory Visit: Payer: Self-pay | Admitting: Neurological Surgery

## 2021-02-02 DIAGNOSIS — G959 Disease of spinal cord, unspecified: Secondary | ICD-10-CM

## 2021-02-03 ENCOUNTER — Other Ambulatory Visit: Payer: Self-pay

## 2021-02-03 ENCOUNTER — Ambulatory Visit (HOSPITAL_COMMUNITY): Payer: Medicare Other | Attending: Cardiovascular Disease

## 2021-02-03 DIAGNOSIS — I5032 Chronic diastolic (congestive) heart failure: Secondary | ICD-10-CM | POA: Insufficient documentation

## 2021-02-03 DIAGNOSIS — R0600 Dyspnea, unspecified: Secondary | ICD-10-CM | POA: Insufficient documentation

## 2021-02-03 LAB — ECHOCARDIOGRAM COMPLETE
Area-P 1/2: 3.2 cm2
S' Lateral: 3.7 cm

## 2021-02-03 MED ORDER — PERFLUTREN LIPID MICROSPHERE
3.0000 mL | INTRAVENOUS | Status: AC | PRN
  Administered 2021-02-03: 3 mL via INTRAVENOUS

## 2021-02-10 ENCOUNTER — Other Ambulatory Visit: Payer: Self-pay | Admitting: Family Medicine

## 2021-02-10 DIAGNOSIS — N401 Enlarged prostate with lower urinary tract symptoms: Secondary | ICD-10-CM

## 2021-02-13 ENCOUNTER — Telehealth: Payer: Self-pay

## 2021-02-13 ENCOUNTER — Other Ambulatory Visit: Payer: Self-pay | Admitting: Family Medicine

## 2021-02-13 DIAGNOSIS — N401 Enlarged prostate with lower urinary tract symptoms: Secondary | ICD-10-CM

## 2021-02-13 DIAGNOSIS — R3911 Hesitancy of micturition: Secondary | ICD-10-CM

## 2021-02-13 NOTE — Telephone Encounter (Signed)
Spoke with the pt and gave him his Echo results... he was hard to understand but advised me that he does not heck his BP at home and unsure about his meds... he has an upcoming appt with Dr. Eldridge Dace this week and he read back the date and time to me and will bring in all of his meds with him to the office.

## 2021-02-13 NOTE — Telephone Encounter (Signed)
-----   Message from Corky Crafts, MD sent at 02/05/2021 10:25 PM EDT ----- Normal LV/RV valvular function. Continue with med changes from the last visit.  How is BP?  If still high, could also see PharmD HTN clinic.

## 2021-02-16 ENCOUNTER — Other Ambulatory Visit: Payer: Self-pay

## 2021-02-16 ENCOUNTER — Encounter: Payer: Self-pay | Admitting: Interventional Cardiology

## 2021-02-16 ENCOUNTER — Ambulatory Visit (INDEPENDENT_AMBULATORY_CARE_PROVIDER_SITE_OTHER): Payer: Medicare Other | Admitting: Interventional Cardiology

## 2021-02-16 VITALS — BP 130/50 | HR 43 | Ht 71.0 in | Wt 346.4 lb

## 2021-02-16 DIAGNOSIS — R600 Localized edema: Secondary | ICD-10-CM | POA: Diagnosis not present

## 2021-02-16 DIAGNOSIS — I5032 Chronic diastolic (congestive) heart failure: Secondary | ICD-10-CM | POA: Diagnosis not present

## 2021-02-16 DIAGNOSIS — L989 Disorder of the skin and subcutaneous tissue, unspecified: Secondary | ICD-10-CM

## 2021-02-16 DIAGNOSIS — I10 Essential (primary) hypertension: Secondary | ICD-10-CM

## 2021-02-16 MED ORDER — AMLODIPINE BESYLATE 5 MG PO TABS: ORAL_TABLET | ORAL | 3 refills | Status: AC

## 2021-02-16 MED ORDER — FUROSEMIDE 40 MG PO TABS: 40.0000 mg | ORAL_TABLET | Freq: Two times a day (BID) | ORAL | 3 refills | Status: AC
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
## 2021-02-16 NOTE — Patient Instructions (Signed)
Medication Instructions:  Your physician has recommended you make the following change in your medication: INCREASE: furosemide (Lasix) to 40 mg by mouth twice daily  Refilled amlodipine (Norvasc) 5mg  *If you need a refill on your cardiac medications before your next appointment, please call your pharmacy*   Lab Work: IN 1 WEEK: BMET If you have labs (blood work) drawn today and your tests are completely normal, you will receive your results only by: MyChart Message (if you have MyChart) OR A paper copy in the mail If you have any lab test that is abnormal or we need to change your treatment, we will call you to review the results.   Testing/Procedures: Your physician has placed a Home Health Referral for skin care and diastolic heart failure.   Follow-Up: At [facility], you and your health needs are our priority.  As part of our continuing mission to provide you with exceptional heart care, we have created designated Provider Care Teams.  These Care Teams include your primary Cardiologist (physician) and Advanced Practice Providers (APPs -  Physician Assistants and Nurse Practitioners) who all work together to provide you with the care you need, when you need it.  We recommend signing up for the patient portal called "MyChart".  Sign up information is provided on this After Visit Summary.  MyChart is used to connect with patients for Virtual Visits (Telemedicine).  Patients are able to view lab/test results, encounter notes, upcoming appointments, etc.  Non-urgent messages can be sent to your provider as well.   To learn more about what you can do with MyChart, go to [facility].    Your next appointment:   6 month(s)  The format for your next appointment:   In Person  Provider:   You may see [email or web address], MD or one of the following Advanced Practice Providers on your designated Care Team:   Lance Muss, PA-C Ronie Spies, PA-C

## 2021-02-21 ENCOUNTER — Ambulatory Visit: Payer: Medicare Other | Admitting: Medical

## 2021-02-21 ENCOUNTER — Telehealth: Payer: Self-pay | Admitting: Medical

## 2021-02-21 DIAGNOSIS — I499 Cardiac arrhythmia, unspecified: Secondary | ICD-10-CM | POA: Diagnosis not present

## 2021-02-21 DIAGNOSIS — I469 Cardiac arrest, cause unspecified: Secondary | ICD-10-CM | POA: Diagnosis not present

## 2021-02-23 ENCOUNTER — Other Ambulatory Visit: Payer: Medicare Other

## 2021-02-23 DIAGNOSIS — 419620001 Death: Secondary | SNOMED CT | POA: Diagnosis not present
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
## 2021-02-23 DEATH — deceased

## 2021-03-27 ENCOUNTER — Encounter (HOSPITAL_BASED_OUTPATIENT_CLINIC_OR_DEPARTMENT_OTHER): Payer: Medicare Other | Admitting: Cardiology

## 2021-06-11 ENCOUNTER — Other Ambulatory Visit: Payer: Self-pay | Admitting: Family Medicine

## 2021-06-11 DIAGNOSIS — N401 Enlarged prostate with lower urinary tract symptoms: Secondary | ICD-10-CM

## 2021-06-13 ENCOUNTER — Other Ambulatory Visit: Payer: Self-pay | Admitting: Family Medicine

## 2021-06-13 DIAGNOSIS — G8929 Other chronic pain: Secondary | ICD-10-CM

## 2021-08-22 ENCOUNTER — Ambulatory Visit: Payer: Medicare Other | Admitting: Interventional Cardiology
